# Patient Record
Sex: Female | Born: 1960
Health system: Southern US, Community
[De-identification: ages and names within clinical notes are randomized; demographics above are authoritative.]

## PROBLEM LIST (undated history)

## (undated) DIAGNOSIS — F32A Depression, unspecified: Secondary | ICD-10-CM

## (undated) DIAGNOSIS — E785 Hyperlipidemia, unspecified: Secondary | ICD-10-CM

## (undated) DIAGNOSIS — I252 Old myocardial infarction: Secondary | ICD-10-CM

## (undated) DIAGNOSIS — I2510 Atherosclerotic heart disease of native coronary artery without angina pectoris: Secondary | ICD-10-CM

## (undated) DIAGNOSIS — Z8249 Family history of ischemic heart disease and other diseases of the circulatory system: Secondary | ICD-10-CM

## (undated) DIAGNOSIS — R030 Elevated blood-pressure reading, without diagnosis of hypertension: Secondary | ICD-10-CM

## (undated) DIAGNOSIS — F419 Anxiety disorder, unspecified: Secondary | ICD-10-CM

## (undated) DIAGNOSIS — R931 Abnormal findings on diagnostic imaging of heart and coronary circulation: Principal | ICD-10-CM

## (undated) HISTORY — DX: Abnormal findings on diagnostic imaging of heart and coronary circulation: R93.1

## (undated) HISTORY — DX: Atherosclerotic heart disease of native coronary artery without angina pectoris: I25.2

## (undated) HISTORY — DX: Elevated blood-pressure reading, without diagnosis of hypertension: R03.0

## (undated) HISTORY — DX: Family history of ischemic heart disease and other diseases of the circulatory system: Z82.49

## (undated) HISTORY — DX: Hyperlipidemia, unspecified: E78.5

---

## 1993-02-10 HISTORY — PX: LAPAROSCOPIC CHOLECYSTECTOMY: SUR755

## 1997-06-12 ENCOUNTER — Other Ambulatory Visit: Admission: RE | Admit: 1997-06-12 | Discharge: 1997-06-12 | Payer: Self-pay | Admitting: Obstetrics and Gynecology

## 1998-06-18 ENCOUNTER — Other Ambulatory Visit: Admission: RE | Admit: 1998-06-18 | Discharge: 1998-06-18 | Payer: Self-pay | Admitting: Obstetrics and Gynecology

## 1999-11-08 ENCOUNTER — Other Ambulatory Visit: Admission: RE | Admit: 1999-11-08 | Discharge: 1999-11-08 | Payer: Self-pay | Admitting: Obstetrics and Gynecology

## 2000-12-10 ENCOUNTER — Other Ambulatory Visit: Admission: RE | Admit: 2000-12-10 | Discharge: 2000-12-10 | Payer: Self-pay | Admitting: Obstetrics and Gynecology

## 2002-06-09 ENCOUNTER — Other Ambulatory Visit: Admission: RE | Admit: 2002-06-09 | Discharge: 2002-06-09 | Payer: Self-pay | Admitting: Obstetrics and Gynecology

## 2002-12-12 ENCOUNTER — Emergency Department (HOSPITAL_COMMUNITY): Admission: EM | Admit: 2002-12-12 | Discharge: 2002-12-13 | Payer: Self-pay | Admitting: Emergency Medicine

## 2003-08-31 ENCOUNTER — Other Ambulatory Visit: Admission: RE | Admit: 2003-08-31 | Discharge: 2003-08-31 | Payer: Self-pay | Admitting: Obstetrics and Gynecology

## 2005-02-25 ENCOUNTER — Other Ambulatory Visit: Admission: RE | Admit: 2005-02-25 | Discharge: 2005-02-25 | Payer: Self-pay | Admitting: Obstetrics and Gynecology

## 2012-07-20 ENCOUNTER — Other Ambulatory Visit: Payer: Self-pay | Admitting: *Deleted

## 2012-07-20 MED ORDER — LOSARTAN POTASSIUM 50 MG PO TABS: 50.0000 mg | ORAL_TABLET | Freq: Every day | ORAL | Status: DC

## 2012-07-28 ENCOUNTER — Other Ambulatory Visit: Payer: Self-pay | Admitting: Obstetrics and Gynecology

## 2012-07-28 DIAGNOSIS — N6321 Unspecified lump in the left breast, upper outer quadrant: Secondary | ICD-10-CM

## 2012-08-10 ENCOUNTER — Ambulatory Visit
Admission: RE | Admit: 2012-08-10 | Discharge: 2012-08-10 | Disposition: A | Discharge status: Home / Self Care | Payer: BC Managed Care – PPO | Source: Ambulatory Visit | Attending: Obstetrics and Gynecology | Admitting: Obstetrics and Gynecology

## 2012-08-10 DIAGNOSIS — N6321 Unspecified lump in the left breast, upper outer quadrant: Secondary | ICD-10-CM

## 2013-02-21 ENCOUNTER — Other Ambulatory Visit: Payer: Self-pay | Admitting: *Deleted

## 2013-02-21 MED ORDER — LOSARTAN POTASSIUM 50 MG PO TABS: 50.0000 mg | ORAL_TABLET | Freq: Every day | ORAL | Status: DC

## 2013-09-19 ENCOUNTER — Other Ambulatory Visit: Payer: Self-pay | Admitting: *Deleted

## 2015-07-10 DIAGNOSIS — Z6822 Body mass index (BMI) 22.0-22.9, adult: Secondary | ICD-10-CM | POA: Diagnosis not present

## 2015-07-10 DIAGNOSIS — Z1231 Encounter for screening mammogram for malignant neoplasm of breast: Secondary | ICD-10-CM | POA: Diagnosis not present

## 2015-07-10 DIAGNOSIS — Z01419 Encounter for gynecological examination (general) (routine) without abnormal findings: Secondary | ICD-10-CM | POA: Diagnosis not present

## 2015-10-26 DIAGNOSIS — I10 Essential (primary) hypertension: Secondary | ICD-10-CM | POA: Diagnosis not present

## 2015-10-26 DIAGNOSIS — Z0000 Encounter for general adult medical examination without abnormal findings: Secondary | ICD-10-CM | POA: Diagnosis not present

## 2015-10-26 DIAGNOSIS — E784 Other hyperlipidemia: Secondary | ICD-10-CM | POA: Diagnosis not present

## 2015-11-02 DIAGNOSIS — I10 Essential (primary) hypertension: Secondary | ICD-10-CM | POA: Diagnosis not present

## 2015-11-02 DIAGNOSIS — Z6823 Body mass index (BMI) 23.0-23.9, adult: Secondary | ICD-10-CM | POA: Diagnosis not present

## 2015-11-02 DIAGNOSIS — E784 Other hyperlipidemia: Secondary | ICD-10-CM | POA: Diagnosis not present

## 2015-11-02 DIAGNOSIS — Z0000 Encounter for general adult medical examination without abnormal findings: Secondary | ICD-10-CM | POA: Diagnosis not present

## 2016-04-11 DIAGNOSIS — Z6824 Body mass index (BMI) 24.0-24.9, adult: Secondary | ICD-10-CM | POA: Diagnosis not present

## 2016-04-11 DIAGNOSIS — J111 Influenza due to unidentified influenza virus with other respiratory manifestations: Secondary | ICD-10-CM | POA: Diagnosis not present

## 2016-04-11 DIAGNOSIS — R05 Cough: Secondary | ICD-10-CM | POA: Diagnosis not present

## 2016-04-30 DIAGNOSIS — R430 Anosmia: Secondary | ICD-10-CM | POA: Diagnosis not present

## 2016-04-30 DIAGNOSIS — J302 Other seasonal allergic rhinitis: Secondary | ICD-10-CM | POA: Insufficient documentation

## 2016-05-20 DIAGNOSIS — H2513 Age-related nuclear cataract, bilateral: Secondary | ICD-10-CM | POA: Diagnosis not present

## 2016-07-09 DIAGNOSIS — J3489 Other specified disorders of nose and nasal sinuses: Secondary | ICD-10-CM | POA: Diagnosis not present

## 2016-07-09 DIAGNOSIS — R430 Anosmia: Secondary | ICD-10-CM | POA: Diagnosis not present

## 2016-07-09 DIAGNOSIS — J302 Other seasonal allergic rhinitis: Secondary | ICD-10-CM | POA: Diagnosis not present

## 2016-08-19 DIAGNOSIS — Z1231 Encounter for screening mammogram for malignant neoplasm of breast: Secondary | ICD-10-CM | POA: Diagnosis not present

## 2016-08-19 DIAGNOSIS — Z6825 Body mass index (BMI) 25.0-25.9, adult: Secondary | ICD-10-CM | POA: Diagnosis not present

## 2016-08-19 DIAGNOSIS — Z01419 Encounter for gynecological examination (general) (routine) without abnormal findings: Secondary | ICD-10-CM | POA: Diagnosis not present

## 2016-10-31 DIAGNOSIS — E784 Other hyperlipidemia: Secondary | ICD-10-CM | POA: Diagnosis not present

## 2016-10-31 DIAGNOSIS — I10 Essential (primary) hypertension: Secondary | ICD-10-CM | POA: Diagnosis not present

## 2016-10-31 DIAGNOSIS — E559 Vitamin D deficiency, unspecified: Secondary | ICD-10-CM | POA: Diagnosis not present

## 2016-10-31 DIAGNOSIS — Z0000 Encounter for general adult medical examination without abnormal findings: Secondary | ICD-10-CM | POA: Diagnosis not present

## 2016-11-07 DIAGNOSIS — F325 Major depressive disorder, single episode, in full remission: Secondary | ICD-10-CM | POA: Diagnosis not present

## 2016-11-07 DIAGNOSIS — Z23 Encounter for immunization: Secondary | ICD-10-CM | POA: Diagnosis not present

## 2016-11-07 DIAGNOSIS — Z0000 Encounter for general adult medical examination without abnormal findings: Secondary | ICD-10-CM | POA: Diagnosis not present

## 2016-11-07 DIAGNOSIS — R5383 Other fatigue: Secondary | ICD-10-CM | POA: Diagnosis not present

## 2016-11-07 DIAGNOSIS — I10 Essential (primary) hypertension: Secondary | ICD-10-CM | POA: Diagnosis not present

## 2016-11-07 DIAGNOSIS — E784 Other hyperlipidemia: Secondary | ICD-10-CM | POA: Diagnosis not present

## 2017-02-19 ENCOUNTER — Encounter (INDEPENDENT_AMBULATORY_CARE_PROVIDER_SITE_OTHER): Payer: Self-pay | Admitting: Orthopaedic Surgery

## 2017-02-19 ENCOUNTER — Ambulatory Visit (INDEPENDENT_AMBULATORY_CARE_PROVIDER_SITE_OTHER): Payer: BLUE CROSS/BLUE SHIELD

## 2017-02-19 ENCOUNTER — Ambulatory Visit (INDEPENDENT_AMBULATORY_CARE_PROVIDER_SITE_OTHER): Payer: BLUE CROSS/BLUE SHIELD | Admitting: Orthopaedic Surgery

## 2017-02-19 DIAGNOSIS — M25571 Pain in right ankle and joints of right foot: Secondary | ICD-10-CM | POA: Diagnosis not present

## 2017-02-19 DIAGNOSIS — G5761 Lesion of plantar nerve, right lower limb: Secondary | ICD-10-CM | POA: Diagnosis not present

## 2017-02-19 MED ORDER — METHYLPREDNISOLONE ACETATE 40 MG/ML IJ SUSP
40.0000 mg | INTRAMUSCULAR | Status: AC | PRN
  Administered 2017-02-19: 40 mg

## 2017-02-19 MED ORDER — LIDOCAINE HCL 1 % IJ SOLN
1.0000 mL | INTRAMUSCULAR | Status: AC | PRN
  Administered 2017-02-19: 1 mL

## 2017-02-19 NOTE — Progress Notes (Signed)
Office Visit Note   Patient: Heather Lucas           Date of Birth: 10-06-1960           MRN: [id number] Visit Date: 02/19/2017              Requested by: No referring provider defined for this encounter. PCP: Martha ClanShaw, William, MD   Assessment & Plan: Visit Diagnoses:  1. Pain in right ankle and joints of right foot   2. Morton's neuroma of third interspace of right foot     Plan: I do feel that her pain seems to be more from Morton's neuroma.  She does not have a flatfoot deformity there another the bunion deformity I do feel her foot is wider and that better fitting shoes in the toe box area may help.  Also offered injection between third and fourth interspaces with a steroid and explained the risk and benefits of this.  I gave her information on Morton's neuroma as well.  She tolerated the injection well.  We will see her back in about 2 months to see how she is doing overall.  Should be somewhat I would not hesitate to try one more injection if she gets good relief from this.  Follow-Up Instructions: Return in about 2 months (around 04/19/2017).   Orders:  Orders Placed This Encounter  Procedures  . Foot Inj  . XR Foot Complete Right   No orders of the defined types were placed in this encounter.     Procedures: Foot Inj Date/Time: 02/19/2017 3:01 PM Performed by: Kathryne HitchBlackman, Tanzie Rothschild Y, MD Authorized by: Kathryne HitchBlackman, Nataleigh Griffin Y, MD   Condition: Morton's Neuroma   Location:  R foot Medications:  1 mL lidocaine 1 %; 40 mg methylPREDNISolone acetate 40 MG/ML     Clinical Data: No additional findings.   Subjective: Chief Complaint  Patient presents with  . Right Foot - Pain  Patient comes in today with a history of right foot pain.  It hurts mainly over the third and fourth rays between third and fourth interspace.  She does have bunion deformity and she does wear tighter shoe box types of shoes.  She had a inserts as well.  It hurts mainly with walking and is not  every day.  She denies any numbness tingling in her feet.  She is now diabetic.  She said the bunion deformity that she has of her right foot is not problematic right now.  HPI  Review of Systems She currently denies any headache, chest pain, shortness of breath, fever, chills, nausea, vomiting.  Objective: Vital Signs: There were no vitals taken for this visit.  Physical Exam She is alert and oriented x3 and in no acute distress Ortho Exam Examination of her right foot does show an obvious bunion deformity.  There is a click when I compress the foot with pain between third and fourth interspace suggestive of a Morton's neuroma.  Her foot is otherwise well perfused with normal sensation. Specialty Comments:  No specialty comments available.  Imaging: Xr Foot Complete Right  Result Date: 02/19/2017 3 views of the right foot show bunion deformity of the first ray.  There is otherwise no acute findings.    PMFS History: Patient Active Problem List   Diagnosis Date Noted  . Morton's neuroma of third interspace of right foot 02/19/2017  . Pain in right ankle and joints of right foot 02/19/2017   History reviewed. No pertinent past medical history.  History  reviewed. No pertinent family history.  History reviewed. No pertinent surgical history. Social History   Occupational History  . Not on file  Tobacco Use  . Smoking status: Not on file  Substance and Sexual Activity  . Alcohol use: Not on file  . Drug use: Not on file  . Sexual activity: Not on file

## 2017-03-04 ENCOUNTER — Telehealth (INDEPENDENT_AMBULATORY_CARE_PROVIDER_SITE_OTHER): Payer: Self-pay | Admitting: Orthopaedic Surgery

## 2017-03-04 NOTE — Telephone Encounter (Signed)
I would have her try different shoes that allow her toes to spread out more such as new balance.  We can then see her back in follow-up to see how she is doing overall.

## 2017-03-04 NOTE — Telephone Encounter (Signed)
Please advise 

## 2017-03-04 NOTE — Telephone Encounter (Signed)
Patient came in a few weeks ago and received an injection said it has helped with the pain in the heel of her foot but now the pain has moved down to her toes. Just wanted to know if she should get different shoes/another injection etc. CB # [phone]

## 2017-03-05 NOTE — Telephone Encounter (Signed)
Patient aware of the below message from Dr. Blackman  

## 2017-05-27 DIAGNOSIS — E559 Vitamin D deficiency, unspecified: Secondary | ICD-10-CM | POA: Diagnosis not present

## 2017-05-27 DIAGNOSIS — Z6822 Body mass index (BMI) 22.0-22.9, adult: Secondary | ICD-10-CM | POA: Diagnosis not present

## 2017-05-27 DIAGNOSIS — R55 Syncope and collapse: Secondary | ICD-10-CM | POA: Diagnosis not present

## 2017-05-27 DIAGNOSIS — E7849 Other hyperlipidemia: Secondary | ICD-10-CM | POA: Diagnosis not present

## 2017-05-27 DIAGNOSIS — N390 Urinary tract infection, site not specified: Secondary | ICD-10-CM | POA: Diagnosis not present

## 2017-06-01 DIAGNOSIS — H10413 Chronic giant papillary conjunctivitis, bilateral: Secondary | ICD-10-CM | POA: Diagnosis not present

## 2017-07-10 DIAGNOSIS — F325 Major depressive disorder, single episode, in full remission: Secondary | ICD-10-CM | POA: Diagnosis not present

## 2017-07-10 DIAGNOSIS — Z6823 Body mass index (BMI) 23.0-23.9, adult: Secondary | ICD-10-CM | POA: Diagnosis not present

## 2017-07-10 DIAGNOSIS — R5383 Other fatigue: Secondary | ICD-10-CM | POA: Diagnosis not present

## 2017-08-05 DIAGNOSIS — Z8249 Family history of ischemic heart disease and other diseases of the circulatory system: Secondary | ICD-10-CM | POA: Diagnosis not present

## 2017-08-05 DIAGNOSIS — E7849 Other hyperlipidemia: Secondary | ICD-10-CM | POA: Diagnosis not present

## 2017-08-05 DIAGNOSIS — F339 Major depressive disorder, recurrent, unspecified: Secondary | ICD-10-CM | POA: Diagnosis not present

## 2017-08-05 DIAGNOSIS — I10 Essential (primary) hypertension: Secondary | ICD-10-CM | POA: Diagnosis not present

## 2017-08-07 ENCOUNTER — Other Ambulatory Visit: Payer: Self-pay | Admitting: Internal Medicine

## 2017-08-07 DIAGNOSIS — F339 Major depressive disorder, recurrent, unspecified: Secondary | ICD-10-CM | POA: Diagnosis not present

## 2017-08-07 DIAGNOSIS — Z8249 Family history of ischemic heart disease and other diseases of the circulatory system: Secondary | ICD-10-CM

## 2017-08-07 DIAGNOSIS — E785 Hyperlipidemia, unspecified: Secondary | ICD-10-CM

## 2017-08-12 ENCOUNTER — Ambulatory Visit
Admission: RE | Admit: 2017-08-12 | Discharge: 2017-08-12 | Disposition: A | Discharge status: Home / Self Care | Payer: Self-pay | Source: Ambulatory Visit | Attending: Internal Medicine | Admitting: Internal Medicine

## 2017-08-12 DIAGNOSIS — Z8249 Family history of ischemic heart disease and other diseases of the circulatory system: Secondary | ICD-10-CM

## 2017-08-12 DIAGNOSIS — E785 Hyperlipidemia, unspecified: Secondary | ICD-10-CM

## 2017-08-26 ENCOUNTER — Encounter: Payer: Self-pay | Admitting: Cardiology

## 2017-08-26 ENCOUNTER — Ambulatory Visit: Payer: BLUE CROSS/BLUE SHIELD | Admitting: Cardiology

## 2017-08-26 VITALS — BP 110/70 | HR 75 | Ht 61.0 in | Wt 123.4 lb

## 2017-08-26 DIAGNOSIS — Z8249 Family history of ischemic heart disease and other diseases of the circulatory system: Secondary | ICD-10-CM | POA: Diagnosis not present

## 2017-08-26 DIAGNOSIS — R0609 Other forms of dyspnea: Secondary | ICD-10-CM | POA: Insufficient documentation

## 2017-08-26 DIAGNOSIS — R931 Abnormal findings on diagnostic imaging of heart and coronary circulation: Secondary | ICD-10-CM

## 2017-08-26 DIAGNOSIS — E785 Hyperlipidemia, unspecified: Secondary | ICD-10-CM | POA: Insufficient documentation

## 2017-08-26 DIAGNOSIS — E7849 Other hyperlipidemia: Secondary | ICD-10-CM | POA: Diagnosis not present

## 2017-08-26 DIAGNOSIS — R0600 Dyspnea, unspecified: Secondary | ICD-10-CM

## 2017-08-26 DIAGNOSIS — T733XXA Exhaustion due to excessive exertion, initial encounter: Secondary | ICD-10-CM | POA: Diagnosis not present

## 2017-08-26 HISTORY — DX: Other hyperlipidemia: E78.49

## 2017-08-26 HISTORY — DX: Family history of ischemic heart disease and other diseases of the circulatory system: Z82.49

## 2017-08-26 HISTORY — DX: Abnormal findings on diagnostic imaging of heart and coronary circulation: R93.1

## 2017-08-26 MED ORDER — METOPROLOL TARTRATE 50 MG PO TABS: 50.0000 mg | ORAL_TABLET | Freq: Once | ORAL | 0 refills | Status: DC

## 2017-08-26 NOTE — Progress Notes (Signed)
PCP: Martha ClanShaw, William, MD  Clinic Note: Chief Complaint  Patient presents with  . New Patient (Initial Visit)    fatigue & DOE; Family h/o premature CAD    HPI: Heather Lucas is a 57 y.o. female who is being seen today for the evaluation of CORONARY ARTERY CALCIFICATION - Agatston Score 125 at the request of Martha ClanShaw, William, MD. Heather Lucas is the daughter of a former patient of mine who recently died (last year - Mother with MI @ age 58 and brother who just had a heart attack within the last few years.).  Heather CavaShiela Ribera was recently seen by Dr. Clelia CroftShaw with complaints of chronic fatigue & dyspnea.  She was concerned about her family history & was evaluated with a Coronary Calcium Score (reviewed below).  Based upon the results & her symptoms, she is referred for cardiology evalution.  Recent Hospitalizations: none  Studies Personally Reviewed - (if available, images/films reviewed: From Epic Chart or Care Everywhere)  CT CARDIAC CALCIUM SCORE: total Agatston Score = 125 with calcification noted on the left main, LAD and right coronary arteries.  Interval History: Heather Lucas presents here today really without any complaints of chest pain, mostly notes having exertional shortness of breath and fatigue.  She really describes it as a almost like a motivation that seems to have improved somewhat with treating for depression, but she just has no sense of "get up and go" she spends lots of time just laying around my couch.  But she does note however she will try to exert herself versus going for a walker going up and down flights of steps she probably feels more fatigued but also notes having dyspnea associated with it.  No associated chest tightness or pressure with exception of a couple times when she has gone little more than usual.  Otherwise, she denies any resting or exertional chest pain with normal level activity.    No PND, orthopnea or edema.  She occasionally has some "flip-flopping" sensations  consistent with palpitations, but no real symptoms of lightheadedness, dizziness, weakness or syncope/near syncope. No TIA/amaurosis fugax symptoms. No claudication.  ROS: A comprehensive was performed. Review of Systems  Constitutional: Positive for malaise/fatigue (Lack of get up and go). Negative for chills and fever.  HENT: Negative for congestion and nosebleeds.   Eyes: Negative.   Respiratory: Negative for cough and wheezing.        DOE per HPI  Gastrointestinal: Negative for abdominal pain, blood in stool, constipation, heartburn and melena.  Genitourinary: Negative for hematuria.  Musculoskeletal: Negative for joint pain and myalgias.  Skin: Negative.   Neurological: Positive for headaches. Negative for dizziness, focal weakness, seizures and weakness.  Psychiatric/Behavioral: Positive for depression. Negative for memory loss. The patient has insomnia. The patient is not nervous/anxious.        Somewhat improved with Lexapro.  All other systems reviewed and are negative.   I have reviewed and (if needed) personally updated the patient's problem list, medications, allergies, past medical and surgical history, social and family history.   Past Medical History:  Diagnosis Date  . Agatston coronary artery calcium score between 100 and 199 08/26/2017  . Borderline hyperlipidemia    as of 10/2016: TC 134, TG 90, HDL 51, LDL 95 (PCP recently increase rosuvastatin dose from 10 to 20 mg)  . Borderline hypertension   . Family history of premature coronary artery disease 08/26/2017    Past Surgical History:  Procedure Laterality Date  . LAPAROSCOPIC CHOLECYSTECTOMY  1995  Current Meds  Medication Sig  . DULoxetine (CYMBALTA) 60 MG capsule Take 60 mg by mouth daily.  Marland Kitchen loratadine (CLARITIN) 10 MG tablet Take 10 mg by mouth daily.  . rosuvastatin (CRESTOR) 20 MG tablet Take 20 mg by mouth daily.  . Senna-Psyllium (PERDIEM PO) Take by mouth.  . Vitamin D, Ergocalciferol, (DRISDOL)  50000 units CAPS capsule Take 50,000 Units by mouth daily.    No Known Allergies  Social History   Tobacco Use  . Smoking status: Never Smoker  . Smokeless tobacco: Never Used  Substance Use Topics  . Alcohol use: Not Currently  . Drug use: Never   Social History   Social History Narrative   She is single.  Lives alone.    Does not routinely exercise    family history includes CAD in her brother; Cancer in her father; Colon cancer in her paternal grandmother; Diabetes in her maternal grandmother; Heart attack in her brother and maternal grandmother; Heart attack (age of onset: 43) in her mother; Other in her paternal grandfather.  Wt Readings from Last 3 Encounters:  08/26/17 123 lb 6.4 oz (56 kg)    PHYSICAL EXAM BP 110/70   Pulse 75   Ht 5\' 1"  (1.549 m)   Wt 123 lb 6.4 oz (56 kg)   LMP  (Approximate)   BMI 23.32 kg/m  Physical Exam  Constitutional: She is oriented to person, place, and time. She appears well-developed and well-nourished. No distress.  Healthy-appearing.  Well-groomed  HENT:  Head: Normocephalic and atraumatic.  Eyes: Pupils are equal, round, and reactive to light. Conjunctivae and EOM are normal.  Neck: Normal range of motion. Neck supple. No hepatojugular reflux and no JVD present. Carotid bruit is not present.  Cardiovascular: Normal rate, regular rhythm, normal heart sounds and intact distal pulses.  No extrasystoles are present. PMI is not displaced. Exam reveals no gallop, no S4 and no friction rub.  No murmur heard. Pulmonary/Chest: Effort normal and breath sounds normal. No respiratory distress. She has no wheezes.  Abdominal: Soft. Bowel sounds are normal. She exhibits no distension. There is no tenderness. There is no rebound.  Musculoskeletal: Normal range of motion. She exhibits no edema.  Neurological: She is alert and oriented to person, place, and time. No cranial nerve deficit.  Psychiatric: She has a normal mood and affect. Her  behavior is normal. Judgment and thought content normal.  Somewhat subdued mood    Adult ECG Report  Rate: 75 ;  Rhythm: normal sinus rhythm and Normal axis, intervals and durations;   Narrative Interpretation: Normal EKG  Other studies Reviewed: Additional studies/ records that were reviewed today include:  Recent Labs: September 21, 20 TC 164, TG 90, HDL of 51, LDL 95.  Hemoglobin18  Hemoglobin 14.1.  Creatinine 0.8.  TSH 1.03   ASSESSMENT / PLAN: Problem List Items Addressed This Visit    Hyperlipidemia due to dietary fat intake (Chronic)    Lipids actually pretty good last September and she is recently increased her dose based on the coronary calcium score.  This is appropriate as we should now be shooting for a goal LDL less than 70 with evidence of coronary artery disease and no significant family history.      Relevant Medications   rosuvastatin (CRESTOR) 20 MG tablet   Other Relevant Orders   EKG 12-Lead (Completed)   Basic metabolic panel   CT CORONARY MORPH W/CTA COR W/SCORE W/CA W/CM &/OR WO/CM   CT CORONARY FRACTIONAL FLOW RESERVE  DATA PREP   CT CORONARY FRACTIONAL FLOW RESERVE FLUID ANALYSIS   Fatigue due to excessive exertion    Similar to DOE      Relevant Orders   EKG 12-Lead (Completed)   Basic metabolic panel   CT CORONARY MORPH W/CTA COR W/SCORE W/CA W/CM &/OR WO/CM   CT CORONARY FRACTIONAL FLOW RESERVE DATA PREP   CT CORONARY FRACTIONAL FLOW RESERVE FLUID ANALYSIS   Family history of premature coronary artery disease (Chronic)    Significant family history with her mother and brother having early symptomatic CAD.   Screening cardiac score was moderately elevated therefore we are evaluated with a cardiac CT angiogram to delineate her anatomy. Continue being more aggressive with risk factor modifications recently had her --- statin increased and is probably due for follow-up labs soon.  This is being monitored by PCP. Blood pressure is well controlled no  medications. Recommend a baby aspirin with moderate CAD at least per coronary calcium score..      Relevant Orders   EKG 12-Lead (Completed)   Basic metabolic panel   CT CORONARY MORPH W/CTA COR W/SCORE W/CA W/CM &/OR WO/CM   CT CORONARY FRACTIONAL FLOW RESERVE DATA PREP   CT CORONARY FRACTIONAL FLOW RESERVE FLUID ANALYSIS   DOE (dyspnea on exertion)    I think her exertional dyspnea is probably related to deconditioning the fatigue is from deconditioning plus depression however, in an effort to try to motivate her to be more active she needs reassurance that her coronary artery disease is not limiting factor. Plan: Coronary CT angiogram with possible FFR.      Relevant Orders   EKG 12-Lead (Completed)   Basic metabolic panel   CT CORONARY MORPH W/CTA COR W/SCORE W/CA W/CM &/OR WO/CM   CT CORONARY FRACTIONAL FLOW RESERVE DATA PREP   CT CORONARY FRACTIONAL FLOW RESERVE FLUID ANALYSIS   Agatston coronary artery calcium score between 100 and 199 - Primary (Chronic)    Somewhat moderate elevation of coronary calcium score but involving the LAD and left main.  The only major symptoms he is noticing his exertional fatigue and dyspnea.  She does have a significant family history and I would like to better evaluate the extent of CAD.    Given the concern for possible left main, I would not want to do a stress test which could potentially be false negative.involvement  Plan: Coronary CT angiogram with preprocedural chemistry panel. -- this allows [place] to get both an anatomic evaluation as well as a potential physiologic evaluation with FFR. I will see her back after study.      Relevant Orders   EKG 12-Lead (Completed)   Basic metabolic panel   CT CORONARY MORPH W/CTA COR W/SCORE W/CA W/CM &/OR WO/CM   CT CORONARY FRACTIONAL FLOW RESERVE DATA PREP   CT CORONARY FRACTIONAL FLOW RESERVE FLUID ANALYSIS      Current medicines are reviewed at length with the patient today.  (+/- concerns)  none The following changes have been made:  recommend baby ASA ( 81 mg)  Patient Instructions  Medication Instructions: Dr Herbie Baltimore recommends that you continue on your current medications as directed. Please refer to the Current Medication list given to you today.  Labwork: Your physician recommends that you return for lab work prior to your coronary CT.  Follow-up: Dr Herbie Baltimore recommends that you schedule a follow-up appointment in AUGUST 2019.  Studies Ordered:   Orders Placed This Encounter  Procedures  . CT CORONARY MORPH W/CTA COR W/SCORE  W/CA W/CM &/OR WO/CM  . CT CORONARY FRACTIONAL FLOW RESERVE DATA PREP  . CT CORONARY FRACTIONAL FLOW RESERVE FLUID ANALYSIS  . Basic metabolic panel  . EKG 12-Lead      Bryan Lemma, M.D., M.S. Interventional Cardiologist   Pager # [phone] Phone # [phone] [address] [place], [place] [address]   Thank you for choosing [facility] at [facility]!!

## 2017-08-26 NOTE — Patient Instructions (Signed)
Medication Instructions: Dr Herbie BaltimoreHarding recommends that you continue on your current medications as directed. Please refer to the Current Medication list given to you today.  Labwork: Your physician recommends that you return for lab work prior to your coronary CT.  Testing/Procedures: 1. Coronary CT - Your physician has requested that you have cardiac CT. Cardiac computed tomography (CT) is a painless test that uses an x-ray machine to take clear, detailed pictures of your heart. For further information please visit [email or web address]. Please follow instruction sheet as given.  Follow-up: Dr Herbie BaltimoreHarding recommends that you schedule a follow-up appointment in AUGUST 2019.  If you need a refill on your cardiac medications before your next appointment, please call your pharmacy.    Please arrive at the [facility] main entrance [facility] at _______ AM (30-45 minutes prior to test start time)  [facility] [address] [place], [place] [address] [phone]  Proceed to the [facility] Radiology Department (First Floor).  Please follow these instructions carefully (unless otherwise directed):  On the Night Before the Test: . Drink plenty of water. . Do not consume any caffeinated/decaffeinated beverages or chocolate 12 hours prior to your test. . Do not take any antihistamines 12 hours prior to your test.  On the Day of the Test: . Drink plenty of water. Do not drink any water within one hour of the test. . Do not eat any food 4 hours prior to the test. . You may take your regular medications prior to the test. . IF NOT ON A BETA BLOCKER - Take 50 mg of lopressor (metoprolol) one hour before the test.  After the Test: . Drink plenty of water. . After receiving IV contrast, you may experience a mild flushed feeling. This is normal. . On occasion, you may experience a mild rash up to 24 hours after the test. This is not dangerous. If this occurs, you can take  Benadryl 25 mg and increase your fluid intake. . If you experience trouble breathing, this can be serious. If it is severe call 911 IMMEDIATELY. If it is mild, please call our office. . If you take any of these medications: Glipizide/Metformin, Avandament, Glucavance, please do not take 48 hours after completing test.

## 2017-08-28 ENCOUNTER — Encounter: Payer: Self-pay | Admitting: Cardiology

## 2017-08-28 DIAGNOSIS — F339 Major depressive disorder, recurrent, unspecified: Secondary | ICD-10-CM | POA: Diagnosis not present

## 2017-08-28 NOTE — Assessment & Plan Note (Signed)
I think her exertional dyspnea is probably related to deconditioning the fatigue is from deconditioning plus depression however, in an effort to try to motivate her to be more active she needs reassurance that her coronary artery disease is not limiting factor. Plan: Coronary CT angiogram with possible FFR.

## 2017-08-28 NOTE — Assessment & Plan Note (Signed)
Lipids actually pretty good last September and she is recently increased her dose based on the coronary calcium score.  This is appropriate as we should now be shooting for a goal LDL less than 70 with evidence of coronary artery disease and no significant family history.

## 2017-08-28 NOTE — Assessment & Plan Note (Signed)
Somewhat moderate elevation of coronary calcium score but involving the LAD and left main.  The only major symptoms he is noticing his exertional fatigue and dyspnea.  She does have a significant family history and I would like to better evaluate the extent of CAD.    Given the concern for possible left main, I would not want to do a stress test which could potentially be false negative.involvement  Plan: Coronary CT angiogram with preprocedural chemistry panel. -- this allows us to get both an anatomic evaluation as well as a potential physiologic evaluation with FFR. I will see her back after study.

## 2017-08-28 NOTE — Assessment & Plan Note (Signed)
Similar to DOE

## 2017-08-28 NOTE — Assessment & Plan Note (Signed)
Significant family history with her mother and brother having early symptomatic CAD.   Screening cardiac score was moderately elevated therefore we are evaluated with a cardiac CT angiogram to delineate her anatomy. Continue being more aggressive with risk factor modifications recently had her --- statin increased and is probably due for follow-up labs soon.  This is being monitored by PCP. Blood pressure is well controlled no medications. Recommend a baby aspirin with moderate CAD at least per coronary calcium score..Marland Kitchen

## 2017-09-28 DIAGNOSIS — N951 Menopausal and female climacteric states: Secondary | ICD-10-CM | POA: Diagnosis not present

## 2017-10-14 ENCOUNTER — Ambulatory Visit: Payer: BLUE CROSS/BLUE SHIELD | Admitting: Cardiology

## 2017-10-16 DIAGNOSIS — R931 Abnormal findings on diagnostic imaging of heart and coronary circulation: Secondary | ICD-10-CM | POA: Diagnosis not present

## 2017-10-16 DIAGNOSIS — T733XXA Exhaustion due to excessive exertion, initial encounter: Secondary | ICD-10-CM | POA: Diagnosis not present

## 2017-10-16 DIAGNOSIS — R0609 Other forms of dyspnea: Secondary | ICD-10-CM | POA: Diagnosis not present

## 2017-10-16 DIAGNOSIS — Z8249 Family history of ischemic heart disease and other diseases of the circulatory system: Secondary | ICD-10-CM | POA: Diagnosis not present

## 2017-10-17 LAB — BASIC METABOLIC PANEL
BUN/Creatinine Ratio: 18 (ref 9–23)
BUN: 14 mg/dL (ref 6–24)
CO2: 25 mmol/L (ref 20–29)
Calcium: 9 mg/dL (ref 8.7–10.2)
Chloride: 102 mmol/L (ref 96–106)
Creatinine, Ser: 0.79 mg/dL (ref 0.57–1.00)
GFR calc Af Amer: 95 mL/min/{1.73_m2} (ref >59)
GFR calc non Af Amer: 83 mL/min/{1.73_m2} (ref >59)
Glucose: 86 mg/dL (ref 65–99)
Potassium: 4.6 mmol/L (ref 3.5–5.2)
Sodium: 141 mmol/L (ref 134–144)

## 2017-10-22 ENCOUNTER — Ambulatory Visit (HOSPITAL_COMMUNITY)
Admission: RE | Admit: 2017-10-22 | Discharge: 2017-10-22 | Disposition: A | Discharge status: Home / Self Care | Payer: BLUE CROSS/BLUE SHIELD | Source: Ambulatory Visit | Attending: Cardiology | Admitting: Cardiology

## 2017-10-22 ENCOUNTER — Encounter (HOSPITAL_COMMUNITY): Payer: Self-pay

## 2017-10-22 ENCOUNTER — Ambulatory Visit (HOSPITAL_COMMUNITY): Admission: RE | Admit: 2017-10-22 | Payer: BLUE CROSS/BLUE SHIELD | Source: Ambulatory Visit

## 2017-10-22 DIAGNOSIS — T733XXA Exhaustion due to excessive exertion, initial encounter: Secondary | ICD-10-CM | POA: Diagnosis not present

## 2017-10-22 DIAGNOSIS — R0609 Other forms of dyspnea: Secondary | ICD-10-CM | POA: Diagnosis not present

## 2017-10-22 DIAGNOSIS — E7849 Other hyperlipidemia: Secondary | ICD-10-CM | POA: Diagnosis not present

## 2017-10-22 DIAGNOSIS — X58XXXA Exposure to other specified factors, initial encounter: Secondary | ICD-10-CM | POA: Insufficient documentation

## 2017-10-22 DIAGNOSIS — I2510 Atherosclerotic heart disease of native coronary artery without angina pectoris: Secondary | ICD-10-CM | POA: Insufficient documentation

## 2017-10-22 DIAGNOSIS — Z8249 Family history of ischemic heart disease and other diseases of the circulatory system: Secondary | ICD-10-CM

## 2017-10-22 DIAGNOSIS — R931 Abnormal findings on diagnostic imaging of heart and coronary circulation: Secondary | ICD-10-CM | POA: Insufficient documentation

## 2017-10-22 DIAGNOSIS — R0600 Dyspnea, unspecified: Secondary | ICD-10-CM

## 2017-10-22 MED ORDER — NITROGLYCERIN 0.4 MG SL SUBL
0.8000 mg | SUBLINGUAL_TABLET | Freq: Once | SUBLINGUAL | Status: AC
  Administered 2017-10-22: 0.8 mg via SUBLINGUAL
  Filled 2017-10-22: qty 25

## 2017-10-22 MED ORDER — IOPAMIDOL (ISOVUE-370) INJECTION 76%
100.0000 mL | Freq: Once | INTRAVENOUS | Status: AC | PRN
  Administered 2017-10-22: 80 mL via INTRAVENOUS

## 2017-10-22 MED ORDER — NITROGLYCERIN 0.4 MG SL SUBL
SUBLINGUAL_TABLET | SUBLINGUAL | Status: AC
  Administered 2017-10-22: 0.8 mg via SUBLINGUAL
  Filled 2017-10-22: qty 2

## 2017-10-26 ENCOUNTER — Telehealth: Payer: Self-pay | Admitting: Cardiology

## 2017-10-26 NOTE — Telephone Encounter (Signed)
Just got report this PM - results sent to RN to call for review.  Intermediate Coronary Calcium Score, but No obstructive Coronary Artery disease (no blockage > 25%).    Recommendation is aggressive Risk Factor Modification -- controlling BP, Cholesterol & Diabetes if present.  No Smoking (stop if possible -- I don't think she does smoke).  Bryan Lemmaavid Wendle Kina, MD

## 2017-10-26 NOTE — Telephone Encounter (Signed)
Follow Up:    Pt calling to find out if her CT results are ready from 10-22-17?

## 2017-10-26 NOTE — Telephone Encounter (Signed)
Spoke with pt who states she was inquiring about result from her Coronary CTA. Advised that Dr. Herbie BaltimoreHarding would have to review and interpret result and she would receive a call soon from his nurse. Pt verbalized understanding.

## 2017-10-27 NOTE — Telephone Encounter (Signed)
Pt updated with test results along with Dr. Elissa HeftyHarding's recommendation. Pt verbalized understanding.

## 2017-10-27 NOTE — Telephone Encounter (Signed)
Left message to call back  

## 2017-11-13 DIAGNOSIS — I10 Essential (primary) hypertension: Secondary | ICD-10-CM | POA: Diagnosis not present

## 2017-11-13 DIAGNOSIS — R82998 Other abnormal findings in urine: Secondary | ICD-10-CM | POA: Diagnosis not present

## 2017-11-13 DIAGNOSIS — Z0000 Encounter for general adult medical examination without abnormal findings: Secondary | ICD-10-CM | POA: Diagnosis not present

## 2017-11-13 DIAGNOSIS — E559 Vitamin D deficiency, unspecified: Secondary | ICD-10-CM | POA: Diagnosis not present

## 2017-11-18 ENCOUNTER — Ambulatory Visit: Payer: BLUE CROSS/BLUE SHIELD | Admitting: Cardiology

## 2017-11-20 DIAGNOSIS — E7849 Other hyperlipidemia: Secondary | ICD-10-CM | POA: Diagnosis not present

## 2017-11-20 DIAGNOSIS — I2510 Atherosclerotic heart disease of native coronary artery without angina pectoris: Secondary | ICD-10-CM | POA: Diagnosis not present

## 2017-11-20 DIAGNOSIS — Z23 Encounter for immunization: Secondary | ICD-10-CM | POA: Diagnosis not present

## 2017-11-20 DIAGNOSIS — F3342 Major depressive disorder, recurrent, in full remission: Secondary | ICD-10-CM | POA: Diagnosis not present

## 2017-11-20 DIAGNOSIS — Z0000 Encounter for general adult medical examination without abnormal findings: Secondary | ICD-10-CM | POA: Diagnosis not present

## 2017-11-20 DIAGNOSIS — I10 Essential (primary) hypertension: Secondary | ICD-10-CM | POA: Diagnosis not present

## 2017-12-01 DIAGNOSIS — Z1231 Encounter for screening mammogram for malignant neoplasm of breast: Secondary | ICD-10-CM | POA: Diagnosis not present

## 2017-12-01 DIAGNOSIS — Z01419 Encounter for gynecological examination (general) (routine) without abnormal findings: Secondary | ICD-10-CM | POA: Diagnosis not present

## 2017-12-01 DIAGNOSIS — Z6823 Body mass index (BMI) 23.0-23.9, adult: Secondary | ICD-10-CM | POA: Diagnosis not present

## 2018-02-10 HISTORY — PX: NM MYOVIEW LTD: HXRAD82

## 2018-04-22 DIAGNOSIS — F339 Major depressive disorder, recurrent, unspecified: Secondary | ICD-10-CM | POA: Diagnosis not present

## 2018-04-22 DIAGNOSIS — Z6825 Body mass index (BMI) 25.0-25.9, adult: Secondary | ICD-10-CM | POA: Diagnosis not present

## 2018-04-30 DIAGNOSIS — F339 Major depressive disorder, recurrent, unspecified: Secondary | ICD-10-CM | POA: Diagnosis not present

## 2018-04-30 DIAGNOSIS — F419 Anxiety disorder, unspecified: Secondary | ICD-10-CM | POA: Diagnosis not present

## 2018-06-30 DIAGNOSIS — R300 Dysuria: Secondary | ICD-10-CM | POA: Diagnosis not present

## 2018-09-07 DIAGNOSIS — R3911 Hesitancy of micturition: Secondary | ICD-10-CM | POA: Diagnosis not present

## 2018-09-07 DIAGNOSIS — R3912 Poor urinary stream: Secondary | ICD-10-CM | POA: Diagnosis not present

## 2018-09-22 ENCOUNTER — Ambulatory Visit (INDEPENDENT_AMBULATORY_CARE_PROVIDER_SITE_OTHER): Payer: BC Managed Care – PPO | Admitting: Orthopaedic Surgery

## 2018-09-22 ENCOUNTER — Other Ambulatory Visit: Payer: Self-pay

## 2018-09-22 DIAGNOSIS — G5761 Lesion of plantar nerve, right lower limb: Secondary | ICD-10-CM

## 2018-09-22 MED ORDER — LIDOCAINE HCL 1 % IJ SOLN
1.0000 mL | INTRAMUSCULAR | Status: AC | PRN
  Administered 2018-09-22: 1 mL

## 2018-09-22 MED ORDER — METHYLPREDNISOLONE ACETATE 40 MG/ML IJ SUSP
40.0000 mg | INTRAMUSCULAR | Status: AC | PRN
  Administered 2018-09-22: 40 mg

## 2018-09-22 NOTE — Progress Notes (Signed)
Office Visit Note   Patient: Heather Lucas           Date of Birth: 02-03-1961           MRN: [id number] Visit Date: 09/22/2018              Requested by: Marton Redwood, MD [address] [place],   [address] PCP: Marton Redwood, MD   Assessment & Plan: Visit Diagnoses:  1. Morton's neuroma of third interspace of right foot     Plan: I did provide a steroid injection in her webspace between the third and fourth toes which she tolerated well.  All question concerns were answered and addressed.  Follow be as needed.  Follow-Up Instructions: Return if symptoms worsen or fail to improve.   Orders:  No orders of the defined types were placed in this encounter.  No orders of the defined types were placed in this encounter.     Procedures: Foot Inj  Date/Time: 09/22/2018 3:14 PM Performed by: Mcarthur Rossetti, MD Authorized by: Mcarthur Rossetti, MD   Indications:  Neuroma Condition: Morton's Neuroma   Location:  R foot Medications:  1 mL lidocaine 1 %; 40 mg methylPREDNISolone acetate 40 MG/ML     Clinical Data: No additional findings.   Subjective: Chief Complaint  Patient presents with  . Right Foot - Pain  The patient comes in today with a painful foot.  This is her right foot.  Is between the third and fourth digits.  This is at the webspace.  She has had a history of a Morton's neuroma before in this area that we provided injection and remotely which helped for a very long period time.  She is worked on better shoe wear with wider toe box.  She is recently had a flareup of pain and would like to have an injection today.  She is moving to [place] very soon.  She denies any other acute changes in her medical status.  She is not a diabetic.  HPI  Review of Systems She currently denies any headache, chest pain, shortness of breath, fever, chills, nausea, vomiting  Objective: Vital Signs: There were no vitals taken for this visit.  Physical  Exam She is alert and orient x3 and in no acute distress Ortho Exam Examination of her right foot shows normal contours of all the webspaces.  She has a lot of pain between the third and fourth interspace between the third and fourth metatarsals and pain on compression of her foot and palpation of this area.  Her foot is otherwise well perfused and is neurovascularly intact. Specialty Comments:  No specialty comments available.  Imaging: No results found.   PMFS History: Patient Active Problem List   Diagnosis Date Noted  . Agatston coronary artery calcium score between 100 and 199 08/26/2017  . Family history of premature coronary artery disease 08/26/2017  . DOE (dyspnea on exertion) 08/26/2017  . Fatigue due to excessive exertion 08/26/2017  . Hyperlipidemia due to dietary fat intake 08/26/2017  . Morton's neuroma of third interspace of right foot 02/19/2017  . Pain in right ankle and joints of right foot 02/19/2017   Past Medical History:  Diagnosis Date  . Agatston coronary artery calcium score between 100 and 199 08/26/2017  . Borderline hyperlipidemia    as of 10/2016: TC 134, TG 90, HDL 51, LDL 95 (PCP recently increase rosuvastatin dose from 10 to 20 mg)  . Borderline hypertension   . Family history  of premature coronary artery disease 08/26/2017    Family History  Problem Relation Age of Onset  . Heart attack Mother [id number]       Recently died; age 59.  . Cancer Father   . Heart attack Brother   . CAD Brother   . Heart attack Maternal Grandmother   . Diabetes Maternal Grandmother   . Colon cancer Paternal Grandmother   . Other Paternal Grandfather        Circulatory problems    Past Surgical History:  Procedure Laterality Date  . LAPAROSCOPIC CHOLECYSTECTOMY  1995   Social History   Occupational History    Employer: [employer]    Comment: Works doing Education officer, communityunderwriting  Tobacco Use  . Smoking status: Never Smoker  . Smokeless tobacco: Never Used  Substance and Sexual  Activity  . Alcohol use: Not Currently  . Drug use: Never  . Sexual activity: Not Currently

## 2019-04-19 DIAGNOSIS — E7801 Familial hypercholesterolemia: Secondary | ICD-10-CM | POA: Insufficient documentation

## 2019-12-08 IMAGING — CT CT HEART SCORING
3 series · 14 of 20 positions shown, 16 images · non-contrast
Comparison: None.

CLINICAL DATA: Hyperlipidemia

EXAM:
CT HEART FOR CALCIUM SCORING
TECHNIQUE: CT heart was performed on a 64 channel system using prospective ECG
gating.
A non-contrast exam for calcium scoring was performed.
Note that this exam targets the heart and the chest was not imaged
in its entirety.

[Series 2: calcium scoring 2.00 qr36 bestdiast 70% · axial · 0.31mm/px · z∈[+1376,+1460]mm · 4 of 70 slices shown]
[im 14/70  vessel]
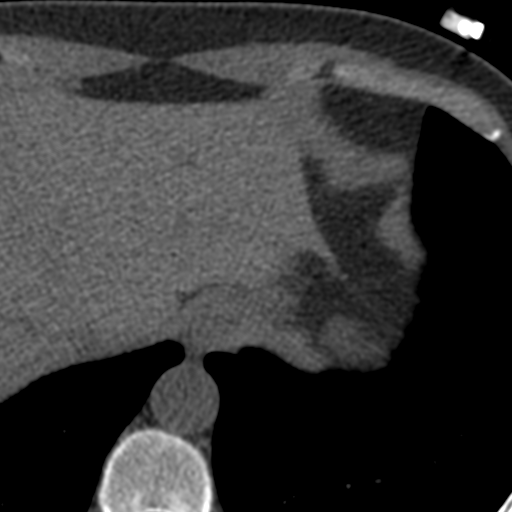
[im 28/70  vessel]
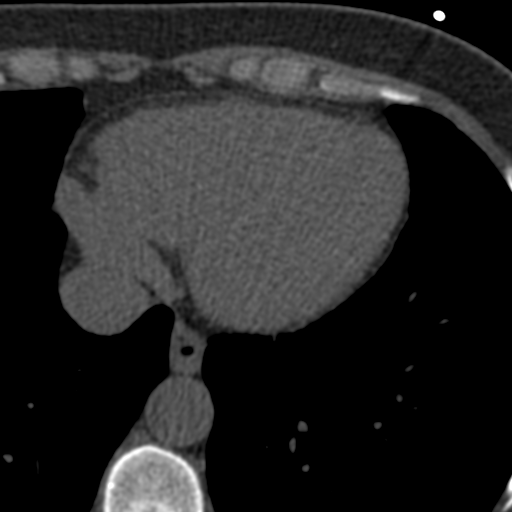
[im 42/70  vessel]
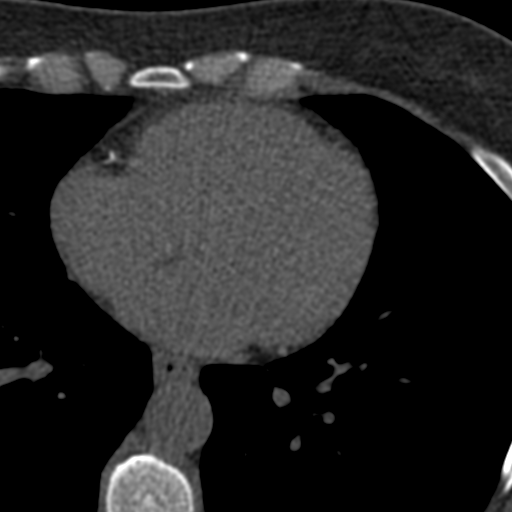
[im 56/70  vessel]
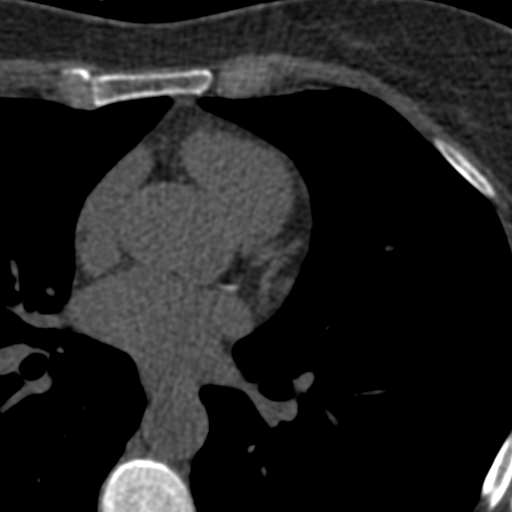

[Series 3: calcium scoring 2.00 br40 bestdiast 70% ax fov · axial · 0.44mm/px · z∈[+1372,+1464]mm · 5 of 70 slices shown, 7 images]
[im 12/70  vessel]
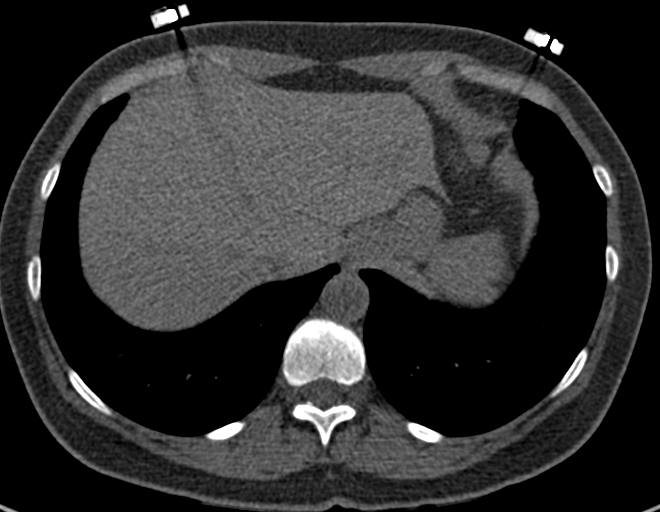
[im 12/70  lung]
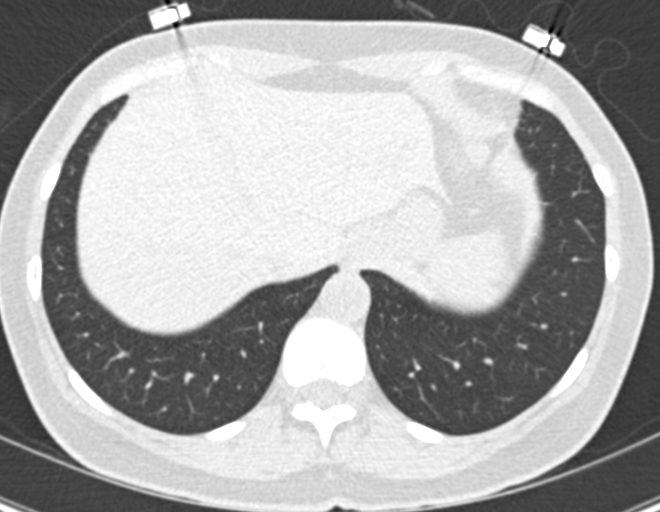
[im 24/70  vessel]
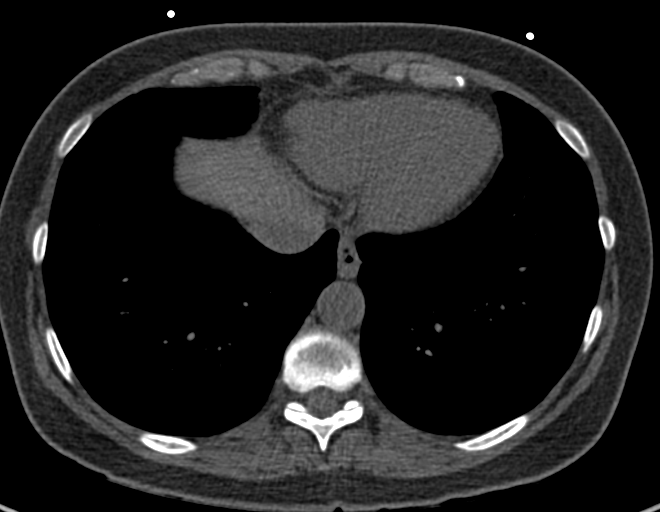
[im 35/70  vessel]
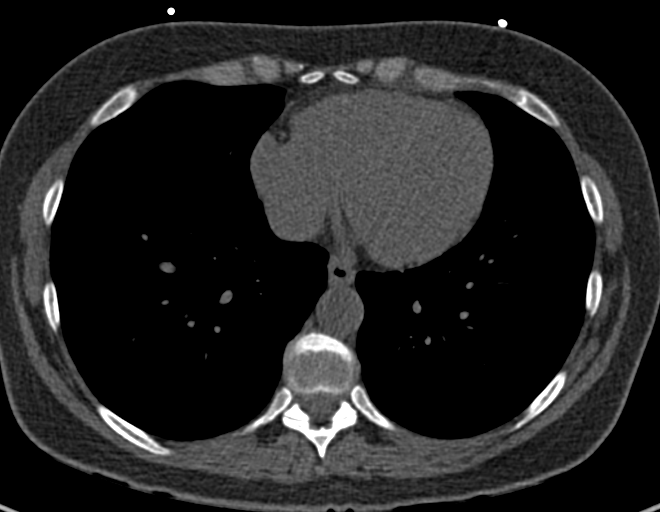
[im 47/70  vessel]
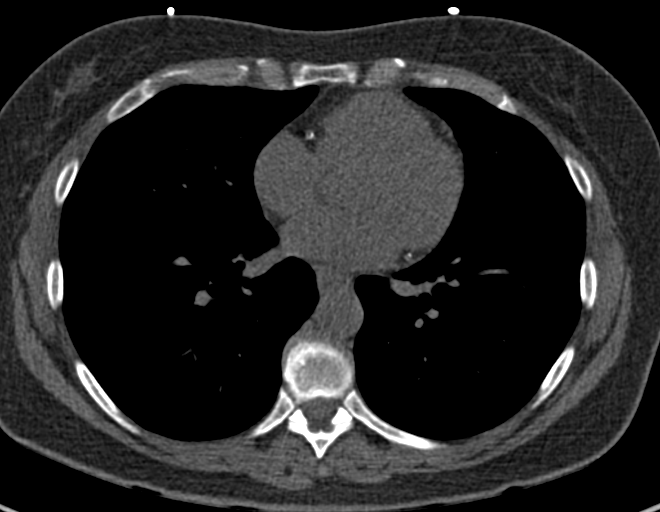
[im 58/70  vessel]
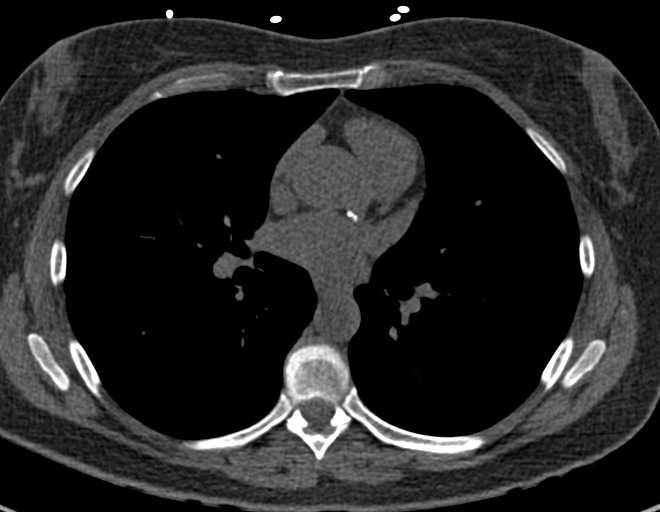
[im 58/70  lung]
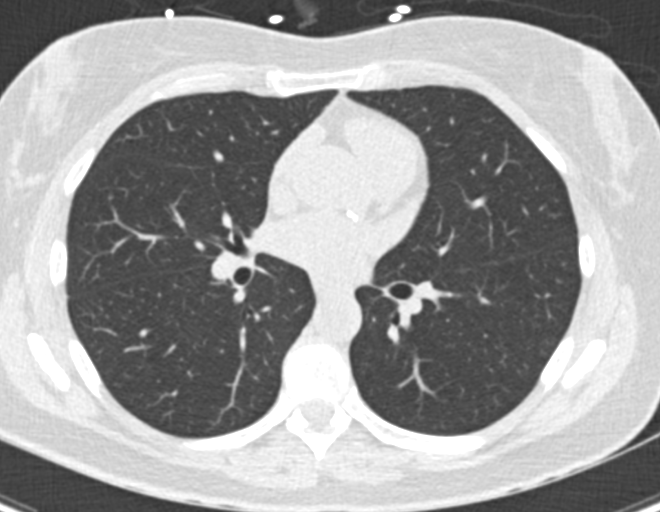

[Series 9: calcium scoring 2.00 br60 bestdiast 70% ax fov · axial · 0.44mm/px · z∈[+1372,+1464]mm · 5 of 70 slices shown]
[im 12/70  vessel]
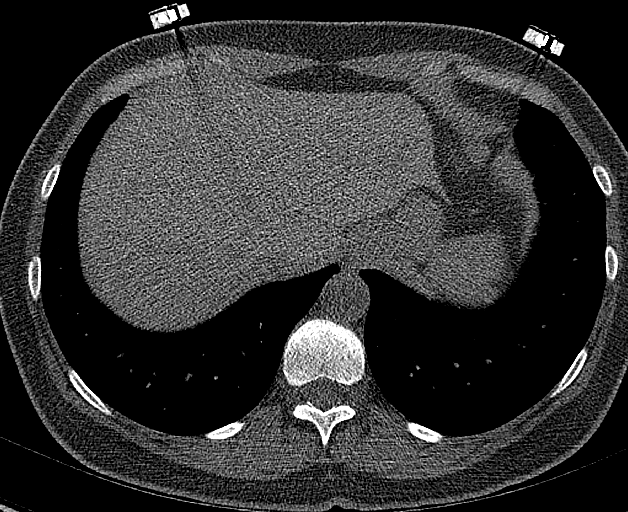
[im 24/70  vessel]
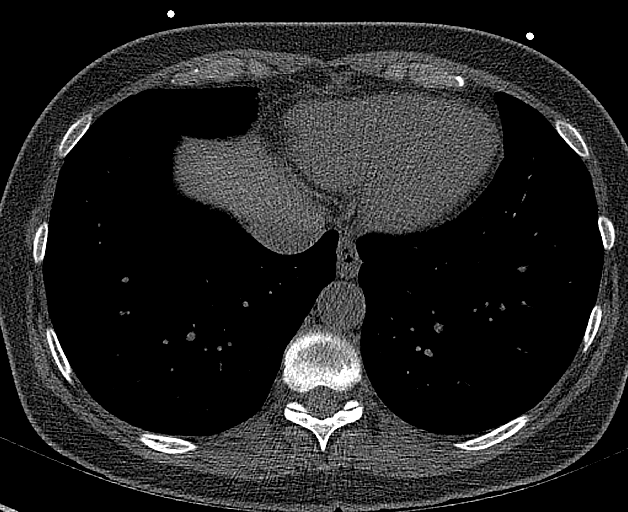
[im 35/70  vessel]
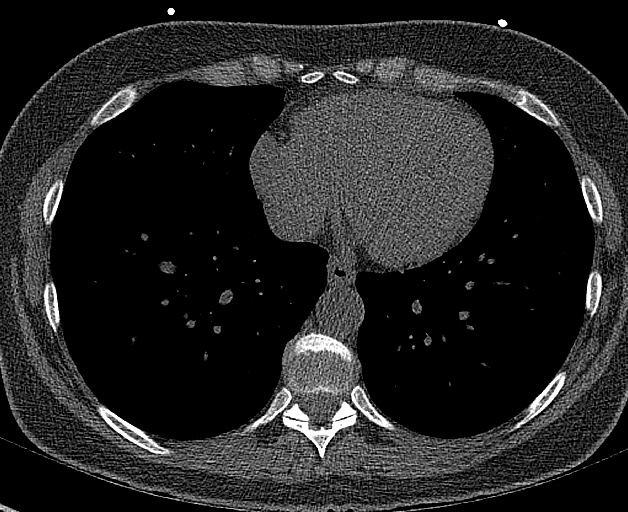
[im 47/70  vessel]
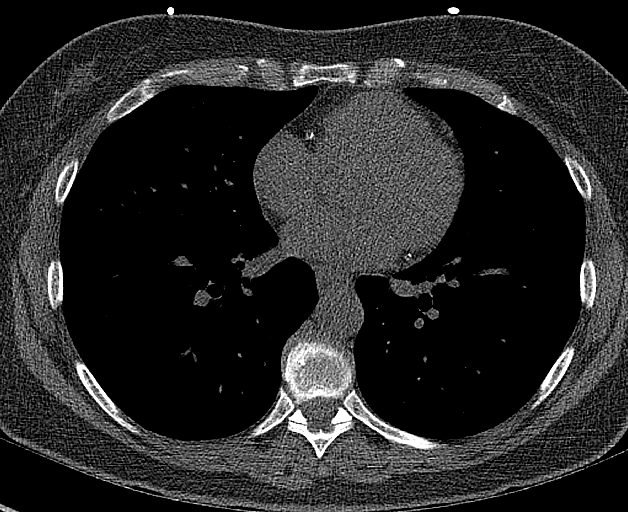
[im 58/70  vessel]
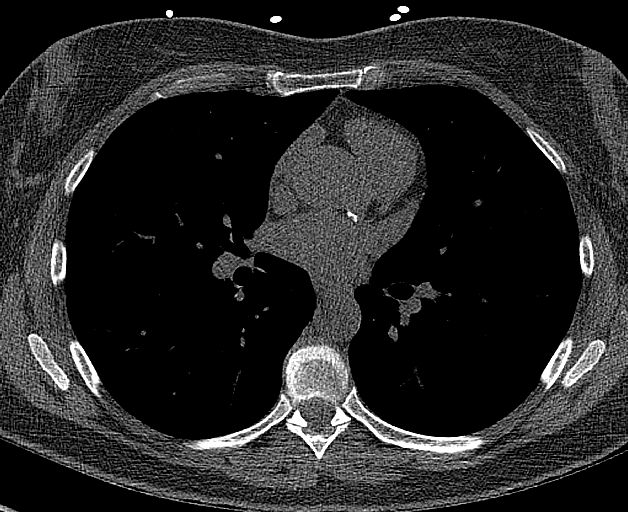

[14 of 20 positions shown; findings below may reference images not displayed]

FINDINGS: Technical quality: Good.

CORONARY CALCIUM

Total Agatston Score: 125 with calcifications noted in the left
main, left anterior descending, and right coronary arteries.

[HOSPITAL] percentile:  93

OTHER FINDINGS:

Cardiovascular: Heart is normal size. Visualized aorta is normal
caliber.

Mediastinum/Nodes: No adenopathy in the lower mediastinum or hila.

Lungs/Pleura: Visualized lungs clear.  No effusions.

Upper Abdomen: Imaging into the upper abdomen shows no acute
findings.

Musculoskeletal: Chest wall soft tissues are unremarkable. No acute
bony abnormality.
IMPRESSION: The observed calcium score of 125 is at the percentile 93 for
subjects of the same age, gender and race/ethnicity who are free of
clinical cardiovascular disease and treated diabetes.

No acute or significant extracardiac abnormality.

## 2020-01-02 DIAGNOSIS — F411 Generalized anxiety disorder: Secondary | ICD-10-CM | POA: Insufficient documentation

## 2020-02-11 HISTORY — PX: LEFT HEART CATH AND CORONARY ANGIOGRAPHY: CATH118249

## 2020-02-11 HISTORY — PX: TRANSTHORACIC ECHOCARDIOGRAM: SHX275

## 2020-02-16 HISTORY — PX: TRANSTHORACIC ECHOCARDIOGRAM: SHX275

## 2020-02-18 DIAGNOSIS — I2510 Atherosclerotic heart disease of native coronary artery without angina pectoris: Secondary | ICD-10-CM | POA: Insufficient documentation

## 2020-02-18 HISTORY — DX: Atherosclerotic heart disease of native coronary artery without angina pectoris: I25.10

## 2020-02-23 HISTORY — PX: OTHER SURGICAL HISTORY: SHX169

## 2020-03-01 HISTORY — PX: LEFT HEART CATH AND CORONARY ANGIOGRAPHY: CATH118249

## 2020-03-13 HISTORY — PX: CORONARY ARTERY BYPASS GRAFT: SHX141

## 2020-03-19 DIAGNOSIS — Z951 Presence of aortocoronary bypass graft: Secondary | ICD-10-CM

## 2020-03-19 HISTORY — DX: Presence of aortocoronary bypass graft: Z95.1

## 2020-10-29 DIAGNOSIS — E559 Vitamin D deficiency, unspecified: Secondary | ICD-10-CM | POA: Insufficient documentation

## 2020-10-29 DIAGNOSIS — M8589 Other specified disorders of bone density and structure, multiple sites: Secondary | ICD-10-CM | POA: Insufficient documentation

## 2020-10-29 DIAGNOSIS — E538 Deficiency of other specified B group vitamins: Secondary | ICD-10-CM | POA: Insufficient documentation

## 2020-10-29 DIAGNOSIS — E049 Nontoxic goiter, unspecified: Secondary | ICD-10-CM | POA: Insufficient documentation

## 2020-10-29 DIAGNOSIS — Z8659 Personal history of other mental and behavioral disorders: Secondary | ICD-10-CM | POA: Insufficient documentation

## 2021-06-17 ENCOUNTER — Ambulatory Visit (INDEPENDENT_AMBULATORY_CARE_PROVIDER_SITE_OTHER): Payer: BC Managed Care – PPO | Admitting: Cardiology

## 2021-06-17 ENCOUNTER — Encounter: Payer: Self-pay | Admitting: Cardiology

## 2021-06-17 VITALS — BP 120/90 | HR 78 | Ht 62.0 in | Wt 144.8 lb

## 2021-06-17 DIAGNOSIS — Z8249 Family history of ischemic heart disease and other diseases of the circulatory system: Secondary | ICD-10-CM | POA: Diagnosis not present

## 2021-06-17 DIAGNOSIS — I255 Ischemic cardiomyopathy: Secondary | ICD-10-CM | POA: Insufficient documentation

## 2021-06-17 DIAGNOSIS — E7849 Other hyperlipidemia: Secondary | ICD-10-CM | POA: Diagnosis not present

## 2021-06-17 DIAGNOSIS — R0609 Other forms of dyspnea: Secondary | ICD-10-CM

## 2021-06-17 DIAGNOSIS — I2510 Atherosclerotic heart disease of native coronary artery without angina pectoris: Secondary | ICD-10-CM | POA: Diagnosis not present

## 2021-06-17 DIAGNOSIS — Z951 Presence of aortocoronary bypass graft: Secondary | ICD-10-CM | POA: Insufficient documentation

## 2021-06-17 DIAGNOSIS — I9581 Postprocedural hypotension: Secondary | ICD-10-CM

## 2021-06-17 HISTORY — DX: Presence of aortocoronary bypass graft: Z95.1

## 2021-06-17 HISTORY — DX: Ischemic cardiomyopathy: I25.5

## 2021-06-17 MED ORDER — MIDODRINE HCL 5 MG PO TABS: ORAL_TABLET | ORAL | Status: DC

## 2021-06-17 NOTE — Patient Instructions (Addendum)
Medication Instructions:  ?Decrease Midodrine in evening  to 1/2 tablet  ? ?*If you need a refill on your cardiac medications before your next appointment, please call your pharmacy* ? ? ?Lab Work: ? ?If you have labs (blood work) drawn today and your tests are completely normal, you will receive your results only by: ?MyChart Message (if you have MyChart) OR ?A paper copy in the mail ?If you have any lab test that is abnormal or we need to change your treatment, we will call you to review the results. ? ? ?Testing/Procedures: ? ?Will be schedul at [address] ? ?Your physician has requested that you have an echocardiogram. Echocardiography is a painless test that uses sound waves to create images of your heart. It provides your doctor with information about the size and shape of your heart and how well your heart?s chambers and valves are working. This procedure takes approximately one hour. There are no restrictions for this procedure. ? ? ? ?Follow-Up: ?At [facility], you and your health needs are our priority.  As part of our continuing mission to provide you with exceptional heart care, we have created designated Provider Care Teams.  These Care Teams include your primary Cardiologist (physician) and Advanced Practice Providers (APPs -  Physician Assistants and Nurse Practitioners) who all work together to provide you with the care you need, when you need it. ? ?  ? ?Your next appointment:   ?6 month(s) ? ?The format for your next appointment:   ?In Person ? ?Provider:   ?None  ? ? ?Other Instructions  ?

## 2021-06-17 NOTE — Progress Notes (Signed)
? ? ?Primary Care Provider: Cleatis Polka., MD ?[facility] Cardiologist: Bryan Lemma, MD ?Electrophysiologist: None ? ?Clinic Note: ?No chief complaint on file. ? ?=================================== ? ?ASSESSMENT/PLAN  ? ?Problem List Items Addressed This Visit   ? ?  ? Cardiology Problems  ? Coronary artery disease involving native coronary artery of native heart without angina pectoris - Primary (Chronic)  ?  Clearly, her abnormal Coronary Calcium Score from 2019 showed progression of disease when she had an abnormal Myoview in 2022 related to cath and then CABG.  By report there was three-vessel disease, but when they told her the bypass was done they bypassed for arteries.  Likely at least LIMA probably least one of the right coronary artery is more the OM is actually diagonal.  We will try to get CABG report. ? ?Plan: ?Continue statin along with aspirin or Plavix-we may be stop aspirin but I like to see what the studies show. ?Request outside records for cath report and CABG report as well as echo and stress test. ?Establish new baseline EF of echo. ?She has issues with hypotension as it was not on a beta-blocker or ARB/ACE inhibitor.   ?She is, in fact, on midodrine-citing issues with hypotension post CABG.   ?Would like to see if wean the midodrine off ?  ?  ? Relevant Medications  ? aspirin 81 MG EC tablet  ? midodrine (PROAMATINE) 5 MG tablet  ? Other Relevant Orders  ? EKG 12-Lead  ? ECHOCARDIOGRAM COMPLETE  ? Ischemic cardiomyopathy (Chronic)  ?  By her report, she had an abnormal echocardiogram.  The plan was done to reassess the echocardiogram a year out from her CABG, this was not done because of her move.  I would like to get a baseline echocardiogram on her here but would also like to get the outside records as well. ? ?If she does have a EF, she really has NYHA class I symptoms of heart failure. ? ?Plan: ?Establish new baseline with 2D echocardiogram (I would like to wait until we get  previous records before this is done so we have a baseline to compare to. ?Obtain outside records of previous echo, stress test, And CABG reports. ?She is only on aspirin and Plavix as well as statin-is actually hypotensive on midodrine. ?  ?  ? Relevant Medications  ? aspirin 81 MG EC tablet  ? midodrine (PROAMATINE) 5 MG tablet  ? Other Relevant Orders  ? EKG 12-Lead  ? ECHOCARDIOGRAM COMPLETE  ? Hyperlipidemia due to dietary fat intake (Chronic)  ?  Unfortunate, I do not have any labs to start with.  She is due to see her PCP to establish care with Dr. Clelia Croft in July.  She will have labs drawn then. ? ?Target LDL is less than 70.  Was 76 in 2019.  Back in 2022 LDL was 74 having bumped up from 64. ?She is now on rosuvastatin 20 mg. ? ?  ?  ? Relevant Medications  ? aspirin 81 MG EC tablet  ? midodrine (PROAMATINE) 5 MG tablet  ? Other Relevant Orders  ? EKG 12-Lead  ? ECHOCARDIOGRAM COMPLETE  ? Hypotension after procedure (Chronic)  ?  She was hypotensive following her CABG, and is now still on midodrine 5 mg twice daily. ?Plan: We will have her wean down midodrine to take half a tablet.  In the evening, if doing okay after couple weeks, she can reduce the daytime dose as well to half tablet. ? ?Would  like to see where she stands on no medication. ? ?  ?  ? Relevant Medications  ? aspirin 81 MG EC tablet  ? midodrine (PROAMATINE) 5 MG tablet  ?  ? Other  ? Hx of CABG x 4 (Chronic)  ?  CABG x4 for asymptomatic multivessel CAD. ?Need to get CABG report.  Would probably be due for follow-up ischemic evaluation in 2026 unless symptoms warrant.  Interestingly, she had not had symptoms prior to her CABG.  This was triggered following routine surveillance screening with stress test and echocardiogram. ? ?She really did not notice any change in symptoms since CABG.  Maybe a little more energy. ? ?  ?  ? Relevant Orders  ? EKG 12-Lead  ? ECHOCARDIOGRAM COMPLETE  ? Family history of premature coronary artery disease (Chronic)   ?  She is now joint her mother and brother with CAD. ? ?On statin as well as aspirin Plavix. ? ?Has been hypotensive, therefore on midodrine as opposed to beta-blocker, ARB or calcium channel blocker  ? ?  ?  ? Relevant Orders  ? EKG 12-Lead  ? ECHOCARDIOGRAM COMPLETE  ? DOE (dyspnea on exertion) (Chronic)  ?  She has had exertional dyspnea in the past, but really minimal.  Probably more related to fatigue and dyspnea. ? ?  ?  ? Relevant Orders  ? EKG 12-Lead  ? ECHOCARDIOGRAM COMPLETE  ? ? ?=================================== ? ?HPI:   ? ?Heather Lucas is a 61 y.o. female with a history of CAD-CABG (found during routine surveillance testing with Echocardiogram and Myoview that was abnormal leading to cardiac catheterization while living in [place]-screening because of family history) who is being seen today TO ESTABLISH CARDIOLOGY CARE at the request of Cleatis Polka., MD. ? ?Heather Lucas was last seen back in July 2019 for evaluation of abnormal Coronary Calcium Score and fatigue.  She subsequently moved down to [place] to live near her brother after mother died here.   ?She recently moved back to [place] [facility] where she was born and raised after living in [place] about the last 2 and half years.  While she was there she was diagnosed with multivessel CAD and what sounds like cardiomyopathy with reduced EF on echo leading to stress test-Then finally CABG.  She never had any chest pain or pressure with these conditions-no chest pain or pressure and no heart patient is a PAD, orthopnea.  Just fatigue is a chronic issue. ? ?Recent Hospitalizations: None ? ?Reviewed  CV studies:   ? ?The following studies were reviewed today: (if available, images/films reviewed: From Epic Chart or Care Everywhere) ?Unfortunate no reports are available.=> She has had several echocardiograms, but has not had one done for the posthospital follow-up after CABG that was scheduled to be done in February of this  year.;  She had a Myoview and echocardiogram prior to CABG that led to her cath finding multivessel disease. ? ? ?Interval History:  ? ?Heather Lucas presents here today to establish cardiology care.  I have not seen her in about 4 years.  In the interim, she had progression of her CAD to the point of having multivessel disease, and Leading to CABG.  Interestingly, she is remained asymptomatic.  She does have chronic fatigue, and said that her energy level improved after CABG but really has not had any chest pain/pressure or dyspnea with rest or exertion more than 1 would expect routine exercise. ?She does not have any exercise routine and is  somewhat sedentary.  She does have some exercise intolerance partly because she is not very active. ? ?Relatively asymptomatic regarding standpoint: CV Review of Symptoms (Summary) ?CVROS: : no chest pain or dyspnea on exertion ?positive for - \-Some exercise tolerance, mostly because she does not exercise routinely. ?negative for - edema, irregular heartbeat, orthopnea, palpitations, paroxysmal nocturnal dyspnea, rapid heart rate, shortness of breath, or syncope/near syncope or TIA/amaurosis fugax claudication ? ?REVIEWED OF SYSTEMS  ? ?Review of Systems  ?Constitutional:  Negative for malaise/fatigue (Does not have a lot of energy, but is not very active.) and weight loss.  ?HENT:  Positive for congestion. Negative for nosebleeds.   ?Respiratory:  Negative for cough, shortness of breath and wheezing.   ?Cardiovascular:   ?     Negative per HPI  ?Gastrointestinal:  Negative for blood in stool and melena.  ?Genitourinary:  Negative for hematuria.  ?Musculoskeletal:  Negative for falls, joint pain, myalgias and neck pain.  ?Neurological:  Negative for dizziness, weakness and headaches.  ?Psychiatric/Behavioral: Negative.    ? ?I have reviewed and (if needed) personally updated the patient's problem list, medications, allergies, past medical and surgical history, social and  family history.  ? ?PAST MEDICAL HISTORY  ? ?Past Medical History:  ?Diagnosis Date  ? Agatston coronary artery calcium score between 100 and 199 08/26/2017  ? Borderline hyperlipidemia   ? as of 10/2016: TC 134

## 2021-06-18 ENCOUNTER — Encounter: Payer: Self-pay | Admitting: Cardiology

## 2021-06-18 DIAGNOSIS — I9581 Postprocedural hypotension: Secondary | ICD-10-CM | POA: Insufficient documentation

## 2021-06-18 NOTE — Assessment & Plan Note (Signed)
She has had exertional dyspnea in the past, but really minimal.  Probably more related to fatigue and dyspnea. ?

## 2021-06-18 NOTE — Assessment & Plan Note (Signed)
She is now joint her mother and brother with CAD. ? ?On statin as well as aspirin Plavix. ? ?Has been hypotensive, therefore on midodrine as opposed to beta-blocker, ARB or calcium channel blocker  ?

## 2021-06-18 NOTE — Assessment & Plan Note (Addendum)
Unfortunate, I do not have any labs to start with.  She is due to see her PCP to establish care with Dr. Brigitte Pulse in July.  She will have labs drawn then. ? ?Target LDL is less than 70.  Was 27 in 2019.  Back in 2022 LDL was 74 having bumped up from 64. ?She is now on rosuvastatin 20 mg. ?

## 2021-06-18 NOTE — Assessment & Plan Note (Signed)
CABG x4 for asymptomatic multivessel CAD. ?Need to get CABG report.  Would probably be due for follow-up ischemic evaluation in 2026 unless symptoms warrant.  Interestingly, she had not had symptoms prior to her CABG.  This was triggered following routine surveillance screening with stress test and echocardiogram. ? ?She really did not notice any change in symptoms since CABG.  Maybe a little more energy. ?

## 2021-06-18 NOTE — Assessment & Plan Note (Addendum)
By her report, she had an abnormal echocardiogram.  The plan was done to reassess the echocardiogram a year out from her CABG, this was not done because of her move.  I would like to get a baseline echocardiogram on her here but would also like to get the outside records as well. ? ?If she does have a EF, she really has NYHA class I symptoms of heart failure. ? ?Plan: ?? Establish new baseline with 2D echocardiogram (I would like to wait until we get previous records before this is done so we have a baseline to compare to. ?? Obtain outside records of previous echo, stress test, And CABG reports. ?? She is only on aspirin and Plavix as well as statin-is actually hypotensive on midodrine. ?

## 2021-06-18 NOTE — Assessment & Plan Note (Signed)
She was hypotensive following her CABG, and is now still on midodrine 5 mg twice daily. ?Plan: We will have her wean down midodrine to take half a tablet.  In the evening, if doing okay after couple weeks, she can reduce the daytime dose as well to half tablet. ? ?Would like to see where she stands on no medication. ?

## 2021-06-18 NOTE — Assessment & Plan Note (Signed)
Clearly, her abnormal Coronary Calcium Score from 2019 showed progression of disease when she had an abnormal Myoview in 2022 related to cath and then CABG.  By report there was three-vessel disease, but when they told her the bypass was done they bypassed for arteries.  Likely at least LIMA probably least one of the right coronary artery is more the OM is actually diagonal.  We will try to get CABG report. ? ?Plan: ?? Continue statin along with aspirin or Plavix-we may be stop aspirin but I like to see what the studies show. ?? Request outside records for cath report and CABG report as well as echo and stress test. ?? Establish new baseline EF of echo. ?? She has issues with hypotension as it was not on a beta-blocker or ARB/ACE inhibitor.   ?? She is, in fact, on midodrine-citing issues with hypotension post CABG.   ?? Would like to see if wean the midodrine off ?

## 2021-07-11 HISTORY — PX: TRANSTHORACIC ECHOCARDIOGRAM: SHX275

## 2021-07-19 ENCOUNTER — Ambulatory Visit (HOSPITAL_COMMUNITY): Payer: BC Managed Care – PPO | Attending: Internal Medicine

## 2021-07-19 DIAGNOSIS — I2510 Atherosclerotic heart disease of native coronary artery without angina pectoris: Secondary | ICD-10-CM | POA: Diagnosis not present

## 2021-07-19 DIAGNOSIS — R0609 Other forms of dyspnea: Secondary | ICD-10-CM

## 2021-07-19 DIAGNOSIS — Z951 Presence of aortocoronary bypass graft: Secondary | ICD-10-CM

## 2021-07-19 DIAGNOSIS — I255 Ischemic cardiomyopathy: Secondary | ICD-10-CM | POA: Diagnosis not present

## 2021-07-19 DIAGNOSIS — E7849 Other hyperlipidemia: Secondary | ICD-10-CM | POA: Diagnosis not present

## 2021-07-19 DIAGNOSIS — Z8249 Family history of ischemic heart disease and other diseases of the circulatory system: Secondary | ICD-10-CM | POA: Diagnosis not present

## 2021-07-19 LAB — ECHOCARDIOGRAM COMPLETE
Area-P 1/2: 5.66 cm2
S' Lateral: 2.1 cm

## 2021-07-19 MED ORDER — PERFLUTREN LIPID MICROSPHERE
1.0000 mL | INTRAVENOUS | Status: AC | PRN
  Administered 2021-07-19: 2 mL via INTRAVENOUS

## 2021-07-29 DIAGNOSIS — Z6826 Body mass index (BMI) 26.0-26.9, adult: Secondary | ICD-10-CM | POA: Diagnosis not present

## 2021-07-29 DIAGNOSIS — Z1231 Encounter for screening mammogram for malignant neoplasm of breast: Secondary | ICD-10-CM | POA: Diagnosis not present

## 2021-07-29 DIAGNOSIS — Z1151 Encounter for screening for human papillomavirus (HPV): Secondary | ICD-10-CM | POA: Diagnosis not present

## 2021-07-29 DIAGNOSIS — Z124 Encounter for screening for malignant neoplasm of cervix: Secondary | ICD-10-CM | POA: Diagnosis not present

## 2021-07-29 DIAGNOSIS — Z01419 Encounter for gynecological examination (general) (routine) without abnormal findings: Secondary | ICD-10-CM | POA: Diagnosis not present

## 2021-08-02 ENCOUNTER — Ambulatory Visit (INDEPENDENT_AMBULATORY_CARE_PROVIDER_SITE_OTHER): Payer: BC Managed Care – PPO | Admitting: Adult Health

## 2021-08-02 ENCOUNTER — Encounter: Payer: Self-pay | Admitting: Adult Health

## 2021-08-02 DIAGNOSIS — F331 Major depressive disorder, recurrent, moderate: Secondary | ICD-10-CM

## 2021-08-02 DIAGNOSIS — F411 Generalized anxiety disorder: Secondary | ICD-10-CM | POA: Diagnosis not present

## 2021-08-02 DIAGNOSIS — G2401 Drug induced subacute dyskinesia: Secondary | ICD-10-CM | POA: Diagnosis not present

## 2021-08-02 DIAGNOSIS — F32A Depression, unspecified: Secondary | ICD-10-CM | POA: Insufficient documentation

## 2021-08-02 DIAGNOSIS — F410 Panic disorder [episodic paroxysmal anxiety] without agoraphobia: Secondary | ICD-10-CM

## 2021-08-02 MED ORDER — ALPRAZOLAM 0.5 MG PO TABS
0.5000 mg | ORAL_TABLET | Freq: Two times a day (BID) | ORAL | 2 refills | Status: DC | PRN
Start: 2021-08-02 — End: 2021-09-02

## 2021-08-06 ENCOUNTER — Telehealth: Payer: Self-pay | Admitting: Adult Health

## 2021-08-14 ENCOUNTER — Telehealth: Payer: Self-pay | Admitting: Adult Health

## 2021-08-14 NOTE — Telephone Encounter (Signed)
Noted. Ty!

## 2021-08-14 NOTE — Telephone Encounter (Signed)
Ok to taper off - that was the plan.

## 2021-08-14 NOTE — Telephone Encounter (Signed)
Pt stated she wants to come off Abilify and olanzapine.She is having problems at work and it makes her very sleepy.She wants to be added to the cancellation list as well for a sooner appt.

## 2021-08-14 NOTE — Telephone Encounter (Signed)
Please let me know instructions to taper off

## 2021-08-14 NOTE — Telephone Encounter (Signed)
Pt Lvm at  9:56a.  She said she wants to come off 2 meds that are making her feel bad and making it hard to concentrate at work.  She did not name them.    Next appt 7/10

## 2021-08-14 NOTE — Telephone Encounter (Signed)
Just stop the Abilify and Olanzapine - is she feeling better with going down on them?

## 2021-08-14 NOTE — Telephone Encounter (Signed)
Pt informed.She said yes she feels a little better coming down on the med

## 2021-08-15 NOTE — Telephone Encounter (Signed)
Spoke to pt today, she is fine to wait unitl appt on Mon, 7/10

## 2021-08-16 DIAGNOSIS — E559 Vitamin D deficiency, unspecified: Secondary | ICD-10-CM | POA: Diagnosis not present

## 2021-08-16 DIAGNOSIS — E785 Hyperlipidemia, unspecified: Secondary | ICD-10-CM | POA: Diagnosis not present

## 2021-08-19 ENCOUNTER — Encounter: Payer: Self-pay | Admitting: Adult Health

## 2021-08-19 ENCOUNTER — Ambulatory Visit (INDEPENDENT_AMBULATORY_CARE_PROVIDER_SITE_OTHER): Payer: BC Managed Care – PPO | Admitting: Adult Health

## 2021-08-19 DIAGNOSIS — F410 Panic disorder [episodic paroxysmal anxiety] without agoraphobia: Secondary | ICD-10-CM

## 2021-08-19 DIAGNOSIS — F411 Generalized anxiety disorder: Secondary | ICD-10-CM

## 2021-08-19 DIAGNOSIS — G2401 Drug induced subacute dyskinesia: Secondary | ICD-10-CM | POA: Diagnosis not present

## 2021-08-19 DIAGNOSIS — F331 Major depressive disorder, recurrent, moderate: Secondary | ICD-10-CM

## 2021-08-19 MED ORDER — VORTIOXETINE HBR 10 MG PO TABS: 10.0000 mg | ORAL_TABLET | Freq: Every day | ORAL | 2 refills | Status: DC

## 2021-08-19 NOTE — Progress Notes (Signed)
Cadie Sorci [id number] 1960-06-05 61 y.o.  Subjective:   Patient ID:  Heather Lucas is a 61 y.o. (DOB 1960-10-25) female.  Chief Complaint: No chief complaint on file.   HPI Nyema Hachey presents to the office today for follow-up of MDD, GAD, panic attacks and TD.  Describes mood today as "so-so". Pleasant. Denies tearfulness. Mood symptoms - reports decreased depression - a little bit. Denies irritability. Feels more anxious overall - "hard to work and focus". Reports panic attacks - none recently. Mood is stable - consistent. Has tapered off the Olanzapine and Abilify TD is better - still reports mouth movements, hands shaking, leg movements. Would like to restart theTrintellix - worked well for her. Recently moved back to from  [place] to [place] - for work. Working full time - boss is her best friend. Attends church. Stable interest and motivation. Taking medications as prescribed.  Energy levels lower in the mornings and gets better throughout the day. Active, does not have a regular exercise routine - starting back again.   Enjoys some usual interests and activities. Single. Lives alone. Brother in [place]. Friends local. Attends church. Appetite adequate. Weight stable - 145.2 pounds. Sleeps well most nights. Averages 10 or more hours - has a difficult time getting up. Focus and concentration stable. Completing tasks. Managing aspects of household. Works full times as an Conservator, museum/gallery. 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  Denies AH or VH. Denies self harm. Denies substance use.  Previous medication trials:  Zoloft, Prozac, Wellbutrin, Lexapro, Trintellix, Hydroxyzine.    Review of Systems:  Review of Systems  Musculoskeletal:  Negative for gait problem.  Neurological:  Negative for tremors.  Psychiatric/Behavioral:         Please refer to HPI    Medications: I have reviewed the patient's current medications.  Current Outpatient Medications  Medication Sig Dispense Refill    vortioxetine HBr (TRINTELLIX) 10 MG TABS tablet Take 1 tablet (10 mg total) by mouth daily. 30 tablet 2   ALPRAZolam (XANAX) 0.5 MG tablet Take 1 tablet (0.5 mg total) by mouth 2 (two) times daily as needed for anxiety. 60 tablet 2   aspirin 81 MG EC tablet Take 81 mg by mouth daily.     clopidogrel (PLAVIX) 75 MG tablet Take 75 mg by mouth daily.     ergocalciferol (VITAMIN D2) 1.25 MG (50000 UT) capsule Take 50,000 Units by mouth daily.     midodrine (PROAMATINE) 5 MG tablet Take 5 mg  in the morning  and take 2.5 mg ( 1/2 tablet ) in the evening.     rosuvastatin (CRESTOR) 20 MG tablet Take 20 mg by mouth daily.  5   Sennosides 15 MG TABS Take 15 mg by mouth daily. Takes 8 tablets a night     No current facility-administered medications for this visit.    Medication Side Effects: None  Allergies: No Known Allergies  Past Medical History:  Diagnosis Date   Agatston coronary artery calcium score between 100 and 199 08/26/2017   Borderline hyperlipidemia    as of 10/2016: TC 134, TG 90, HDL 51, LDL 95 (PCP recently increase rosuvastatin dose from 10 to 20 mg)   Borderline hypertension    Coronary artery disease involving native coronary artery of native heart without angina pectoris 02/18/2020   Family history of premature coronary artery disease 08/26/2017   Hx of CABG x 4 06/17/2021   Hyperlipidemia due to dietary fat intake 08/26/2017   Ischemic cardiomyopathy 06/17/2021    Past Medical  History, Surgical history, Social history, and Family history were reviewed and updated as appropriate.   Please see review of systems for further details on the patient's review from today.   Objective:   Physical Exam:  There were no vitals taken for this visit.  Physical Exam Constitutional:      General: She is not in acute distress. Musculoskeletal:        General: No deformity.  Neurological:     Mental Status: She is alert and oriented to person, place, and time.     Coordination:  Coordination normal.  Psychiatric:        Attention and Perception: Attention and perception normal. She does not perceive auditory or visual hallucinations.        Mood and Affect: Mood normal. Mood is not anxious or depressed. Affect is not labile, blunt, angry or inappropriate.        Speech: Speech normal.        Behavior: Behavior normal.        Thought Content: Thought content normal. Thought content is not paranoid or delusional. Thought content does not include homicidal or suicidal ideation. Thought content does not include homicidal or suicidal plan.        Cognition and Memory: Cognition and memory normal.        Judgment: Judgment normal.     Comments: Insight intact     Lab Review:     Component Value Date/Time   NA 141 10/16/2017 1412   K 4.6 10/16/2017 1412   CL 102 10/16/2017 1412   CO2 25 10/16/2017 1412   GLUCOSE 86 10/16/2017 1412   BUN 14 10/16/2017 1412   CREATININE 0.79 10/16/2017 1412   CALCIUM 9.0 10/16/2017 1412   GFRNONAA 83 10/16/2017 1412   GFRAA 95 10/16/2017 1412    No results found for: "WBC", "RBC", "HGB", "HCT", "PLT", "MCV", "MCH", "MCHC", "RDW", "LYMPHSABS", "MONOABS", "EOSABS", "BASOSABS"  No results found for: "POCLITH", "LITHIUM"   No results found for: "PHENYTOIN", "PHENOBARB", "VALPROATE", "CBMZ"   .res Assessment: Plan:    Plan:  PDMP reviewed  Continue Xanax 0.5mg  BID Add Trintellix 5mg  daily x 2 weeks, then increase to 10mg  daily- samples given - script sent  Patient willing to consider Ingrezza - will discuss further at next visit - symptoms improved.  Time spent with patient was 25 minutes. Greater than 50% of face to face time with patient was spent on counseling and coordination of care.    RTC 3 weeks.  Patient advised to contact office with any questions, adverse effects, or acute worsening in signs and symptoms.   Discussed potential benefits, risk, and side effects of benzodiazepines to include potential risk of  tolerance and dependence, as well as possible drowsiness.  Advised patient not to drive if experiencing drowsiness and to take lowest possible effective dose to minimize risk of dependence and tolerance.   Discussed potential metabolic side effects associated with atypical antipsychotics, as well as potential risk for movement side effects. Advised pt to contact office if movement side effects occur.   Diagnoses and all orders for this visit:  Major depressive disorder, recurrent episode, moderate (HCC) -     vortioxetine HBr (TRINTELLIX) 10 MG TABS tablet; Take 1 tablet (10 mg total) by mouth daily.  Generalized anxiety disorder -     vortioxetine HBr (TRINTELLIX) 10 MG TABS tablet; Take 1 tablet (10 mg total) by mouth daily.  Panic attacks  Tardive dyskinesia     Please see  After Visit Summary for patient specific instructions.  Future Appointments  Date Time Provider Department Center  09/16/2021 12:00 PM Dimitris Shanahan, Thereasa Solo, NP [facility] None  12/20/2021  1:20 PM Marykay Lex, MD [facility]    No orders of the defined types were placed in this encounter.   -------------------------------

## 2021-08-21 ENCOUNTER — Telehealth: Payer: Self-pay | Admitting: Adult Health

## 2021-08-21 DIAGNOSIS — Z0000 Encounter for general adult medical examination without abnormal findings: Secondary | ICD-10-CM | POA: Diagnosis not present

## 2021-08-21 DIAGNOSIS — R7301 Impaired fasting glucose: Secondary | ICD-10-CM | POA: Diagnosis not present

## 2021-08-21 DIAGNOSIS — R82998 Other abnormal findings in urine: Secondary | ICD-10-CM | POA: Diagnosis not present

## 2021-08-21 DIAGNOSIS — I10 Essential (primary) hypertension: Secondary | ICD-10-CM | POA: Diagnosis not present

## 2021-08-21 DIAGNOSIS — Z1331 Encounter for screening for depression: Secondary | ICD-10-CM | POA: Diagnosis not present

## 2021-08-21 NOTE — Telephone Encounter (Signed)
Next visit is 09/16/21. Heather Lucas called and said that the medication she was given on her last visit is not making her feel better. She missed work today because of it not helping her. Could someone please call her at [phone]. She didn't state which medication it was on the message.   Pharmacy is:  [facility] - [place], [place] - [address]  Phone:  [phone]  Fax:  [phone]

## 2021-08-21 NOTE — Telephone Encounter (Signed)
LVM to RC. Patient was seen on 7/10 and given samples of Trintellix, so she has taken for 2 days at most.

## 2021-08-22 NOTE — Telephone Encounter (Signed)
Told patient to give the Trintellix more time. She is going to start seeing a therapist in our office.

## 2021-08-22 NOTE — Telephone Encounter (Signed)
Left another VM to RC.  

## 2021-08-26 ENCOUNTER — Ambulatory Visit (INDEPENDENT_AMBULATORY_CARE_PROVIDER_SITE_OTHER): Payer: BC Managed Care – PPO | Admitting: Mental Health

## 2021-08-26 ENCOUNTER — Telehealth: Payer: Self-pay

## 2021-08-26 DIAGNOSIS — F331 Major depressive disorder, recurrent, moderate: Secondary | ICD-10-CM

## 2021-08-26 DIAGNOSIS — F411 Generalized anxiety disorder: Secondary | ICD-10-CM | POA: Diagnosis not present

## 2021-08-26 NOTE — Progress Notes (Signed)
[facility] Counsel[facility] Initial Adult Exam  Name: Heather Lucas Date: 08/26/2021 MRN: [id number] DOB: 08-18-1960 PCP: Heather Polka., MD  Time spent:  50 minutes   Reason for Visit Heather Lucas Problem:  Patient reports coping with Tardive Dyskinesia and is currently in care with Heather Rack, NP.  She recently moved back to this area in January from [place] as she lived there for 2 years. Reports coping with anxiety daily, worries about work "I worry I might lose my job".  She is rx'd medications and is the process of getting on a consistent regimen again.  She spoke w/ her boss  who assured her she is not going to lose her job. 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 She lost her job in [place], which she feels increased her anxiety related to work; her brother continues to live in [place].  She divorced about 30 years ago, which is when she had significant anxiety at that time. Reports low energy currently, panic attacks on occasion-shortness of breath only sx reported.  She stayed in bed the entire weekend, "It feels safe"- has done this on weekends for the past 2 years. Sleeps about 10 hours/night; states she often sleeps to cope with the anxiety.  Prior to that she would go out with friends, clean house etc. Feels fatigued (recent physical from PCP indicated WNL labs) daily. Last week she worked 2 and a half days of 5 due to the fatigue and anxiety.  She has missed some day/s of work most weeks for the past 6 months. She had fear of her last job that she was not doing the job correctly or sufficiently which caused anxiety (also occurred at current job). 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  Currently like her job and wants to improve functioning toward attending work consistently.      Mental Status Exam:    Appearance:    Casual     Behavior:   Appropriate  Motor:   WNL  Speech/Language:    Clear and Coherent  Affect:    Full range   Mood:   anxiety  Thought process:   Logical, linear, goal directed  Thought content:     WNL  Sensory/Perceptual disturbances:     none  Orientation:   x4  Attention:   Good  Concentration:   Good  Memory:   Intact  Fund of knowledge:    Consistent with age and development  Insight:     Good  Judgment:    Good  Impulse Control:   Good     Reported Symptoms:  daily (10/10), depression (3/10)  Risk Assessment: Danger to Self:  No Self-injurious Behavior: No Danger to Others: No Duty to Warn:no Physical Aggression / Violence:No  Access to Firearms a concern: No  Gang Involvement:No  Patient / guardian was educated about steps to take if suicide or homicide risk level increases between visits: yes While future psychiatric events cannot be accurately predicted, the patient does not currently require acute inpatient psychiatric care and does not currently meet [facility] involuntary commitment criteria.  Substance Abuse History: Current substance abuse: No     Past Psychiatric History:   Outpatient Providers: therapy x 3 visits 10 years ago History of Psych Hospitalization: none Psychological Testing:none   Family History:  Raised by both parents, deceased  Father- Jun 01, 2010, mother2018-04-21.  Patient cared for them both prior to passing. Brother- Heather Lucas (  lives in [place])  Family History  Problem Relation Age of Onset   Heart attack Mother 62       Recently died; age 47.   Cancer Father    Heart attack Brother    CAD Brother    Heart attack Maternal Grandmother    Diabetes Maternal Grandmother    Colon cancer Paternal Grandmother    Other Paternal Grandfather        Circulatory problems    Living situation: the patient lives alone  Sexual Orientation:  Straight  Relationship Status: Divorced             If a parent, number of children / ages: none  Support Systems; family, friends  Surveyor, quantity Stress:  yes  Income/Employment/Disability:  full  time  Financial planner: No   Educational History: Education: HS Diploma  Stressors:  interpersonal  Strengths:  Family and Friends  Barriers:  interpersonal   Legal History: Pending legal issue / charges: none  Medical History/Surgical History: Past Medical History:  Diagnosis Date   Agatston coronary artery calcium score between 100 and 199 08/26/2017   Borderline hyperlipidemia    as of 10/2016: TC 134, TG 90, HDL 51, LDL 95 (PCP recently increase rosuvastatin dose from 10 to 20 mg)   Borderline hypertension    Coronary artery disease involving native coronary artery of native heart without angina pectoris 02/18/2020   Family history of premature coronary artery disease 08/26/2017   Hx of CABG x 4 06/17/2021   Hyperlipidemia due to dietary fat intake 08/26/2017   Ischemic cardiomyopathy 06/17/2021    Past Surgical History:  Procedure Laterality Date   CORONARY ARTERY BYPASS GRAFT  03/2020   [facility] [place], [place] -> reportedly CABG x4   LAPAROSCOPIC CHOLECYSTECTOMY  1995   LEFT HEART CATH AND CORONARY ANGIOGRAPHY  02/2020   [facility] [place], [place]) -> multivessel disease, referred for CABG   NM MYOVIEW LTD  02/2018   Clearly appears abnormal, referred for Heather Lucas, [place]   TRANSTHORACIC ECHOCARDIOGRAM  02/2020   Several echoes in January and February 2022-initially noted to be abnormal along with Myoview stress test, immediately post CABG recheck    Medications: Current Outpatient Medications  Medication Sig Dispense Refill   ALPRAZolam (XANAX) 0.5 MG tablet Take 1 tablet (0.5 mg total) by mouth 2 (two) times daily as needed for anxiety. 60 tablet 2   aspirin 81 MG EC tablet Take 81 mg by mouth daily.     clopidogrel (PLAVIX) 75 MG tablet Take 75 mg by mouth daily.     ergocalciferol (VITAMIN D2) 1.25 MG (50000 UT) capsule Take 50,000 Units by mouth daily.     midodrine (PROAMATINE) 5 MG tablet Take 5 mg  in the morning  and take 2.5  mg ( 1/2 tablet ) in the evening.     rosuvastatin (CRESTOR) 20 MG tablet Take 20 mg by mouth daily.  5   Sennosides 15 MG TABS Take 15 mg by mouth daily. Takes 8 tablets a night     vortioxetine HBr (TRINTELLIX) 10 MG TABS tablet Take 1 tablet (10 mg total) by mouth daily. 30 tablet 2   No current facility-administered medications for this visit.    No Known Allergies  Diagnoses:    ICD-10-CM   1. Generalized anxiety disorder  F41.1     2. Major depressive disorder, recurrent episode, moderate (HCC)  F33.1       Plan of Care: TBD   Waldron Session,  [facility]

## 2021-08-26 NOTE — Telephone Encounter (Signed)
Prior Authorization initiated and submitted for TRINTELLIX 10 MG effective 08/26/2021-08/25/2022 with [facility]

## 2021-08-28 ENCOUNTER — Other Ambulatory Visit: Payer: Self-pay

## 2021-08-28 ENCOUNTER — Telehealth: Payer: Self-pay | Admitting: Adult Health

## 2021-08-28 DIAGNOSIS — G2401 Drug induced subacute dyskinesia: Secondary | ICD-10-CM

## 2021-08-28 MED ORDER — VALBENAZINE TOSYLATE 40 MG PO CAPS: 40.0000 mg | ORAL_CAPSULE | Freq: Every day | ORAL | 0 refills | Status: DC

## 2021-08-28 NOTE — Telephone Encounter (Signed)
Let's get the Ingrezza started - 40mg  she has TD.

## 2021-08-28 NOTE — Telephone Encounter (Signed)
Pulled samples, notified patient. Rx sent to generate PA request.

## 2021-08-28 NOTE — Telephone Encounter (Signed)
Patient said she isn't sure what she needs but she is really struggling and needs some help. She said she has missed 2-1/2 days of work this week and she is worried about losing her job. She can't be still. She is sleeping well but has a hard time waking up. The alprazolam helps some and she is taking 2 a day. She drinks 5-6 caffeinated sodas a day, but said she had been doing that for a long time. In your last note it mentions discussing Ingrezza at her next visit. She has an appt in 3 weeks. Do you want me to see if we can get her in sooner or start a medication.

## 2021-08-28 NOTE — Telephone Encounter (Signed)
Pt called at 11 am and said that her TD is really bothering her. She said her anxiety is off the charts. She would to start on medicine for the TD now instead of waiting until next visit 8/7. She missed work today and a half day on Monday. Please call her at [phone]

## 2021-09-02 ENCOUNTER — Other Ambulatory Visit: Payer: Self-pay

## 2021-09-02 ENCOUNTER — Telehealth: Payer: Self-pay

## 2021-09-02 ENCOUNTER — Telehealth: Payer: Self-pay | Admitting: Adult Health

## 2021-09-02 ENCOUNTER — Other Ambulatory Visit: Payer: Self-pay | Admitting: Adult Health

## 2021-09-02 DIAGNOSIS — F331 Major depressive disorder, recurrent, moderate: Secondary | ICD-10-CM

## 2021-09-02 DIAGNOSIS — G2401 Drug induced subacute dyskinesia: Secondary | ICD-10-CM

## 2021-09-02 DIAGNOSIS — F411 Generalized anxiety disorder: Secondary | ICD-10-CM

## 2021-09-02 MED ORDER — VALBENAZINE TOSYLATE 40 MG PO CAPS: 40.0000 mg | ORAL_CAPSULE | Freq: Every day | ORAL | 0 refills | Status: DC

## 2021-09-02 MED ORDER — ALPRAZOLAM 0.5 MG PO TABS: 0.5000 mg | ORAL_TABLET | Freq: Three times a day (TID) | ORAL | 2 refills | Status: DC | PRN

## 2021-09-02 NOTE — Telephone Encounter (Signed)
Prior Authorization submitted and approved for INGREZZA 40 MG effective 09/02/2021-11/30/2021 with [employer]  Rx sent to [facility].  Will contact pharmacy with approval.

## 2021-09-02 NOTE — Telephone Encounter (Signed)
She still wants to speak to you.Should we add her to cancellation list?

## 2021-09-02 NOTE — Telephone Encounter (Signed)
Pt called back to add that she has not been able to work for the past 5 days

## 2021-09-02 NOTE — Telephone Encounter (Signed)
I asked pt what she meant by not feeling good and she said it's hard to explain.She is taking the trintellix and ingrezza.She said she is restless and constantly pacing.Xanax helps a little.She is requesting a sooner appt because she wants to speak to you.

## 2021-09-02 NOTE — Telephone Encounter (Signed)
Pt LVM at 10:32a.  She said she is "not feeling real good on the meds" Almira Coaster has her on.  Wants to know if she can see Almira Coaster today.  [facility] doesn't have any openings.  Maybe you can call to see what else can be done in the short term.  Next appt 8/7

## 2021-09-02 NOTE — Telephone Encounter (Signed)
We can write her out of work if needed.

## 2021-09-02 NOTE — Telephone Encounter (Signed)
Called and spoke with patient.

## 2021-09-03 ENCOUNTER — Telehealth: Payer: Self-pay | Admitting: Adult Health

## 2021-09-03 NOTE — Telephone Encounter (Signed)
Heather Lucas came in and dropped off an Initial Disability Form to be completed. Placed in [name]'s box.

## 2021-09-04 NOTE — Telephone Encounter (Signed)
Heather Lucas has mailed her Ingrezza 40 mg to her.

## 2021-09-05 ENCOUNTER — Telehealth: Payer: Self-pay | Admitting: Adult Health

## 2021-09-05 NOTE — Telephone Encounter (Signed)
Pt called this am checking on the status of the disability form she dropped off. She wants to talk to traci about it. Please give her a call at [phone]

## 2021-09-05 NOTE — Telephone Encounter (Signed)
Noted. Ty!

## 2021-09-08 ENCOUNTER — Telehealth: Payer: Self-pay | Admitting: Psychiatry

## 2021-09-08 DIAGNOSIS — F331 Major depressive disorder, recurrent, moderate: Secondary | ICD-10-CM

## 2021-09-08 DIAGNOSIS — F411 Generalized anxiety disorder: Secondary | ICD-10-CM

## 2021-09-08 MED ORDER — MIRTAZAPINE 15 MG PO TABS: 15.0000 mg | ORAL_TABLET | Freq: Every day | ORAL | 1 refills | Status: DC

## 2021-09-08 MED ORDER — CLONAZEPAM 1 MG PO TABS: ORAL_TABLET | ORAL | 0 refills | Status: DC

## 2021-09-08 NOTE — Telephone Encounter (Signed)
Received call from pt. She reports that she is still "fidgety" with klonopin. She reports that Klonopin causes her to feel sleepy, however she is unable to rest due to feeling restless. She reports that Klonopin is minimally effective for restlessness and is continuing to pace.   Discussed akathisia can occur with discontinuation of antipsychotics, often 2 weeks after discontinuation. Discussed that Allena Earing can also potentially cause akathisia.   She reports that things seemed to be improving late yesterday and then today is no better.   She reports that TD with orofacial and leg movements are slightly improved but not resolved.   Staffed case with pt's provider, Yvette Rack, DNP. Will start Mirtazapine 15 mg po QHS to possible improve akathisia. Discussed plan with pt and that her usual provider would follow-up with her tomorrow. Pt advised to call back with any questions or worsening symptoms.

## 2021-09-08 NOTE — Telephone Encounter (Signed)
Received after hours call from pt reports that she has not been feeling well and has been having difficulty sleeping.  She reports that she will "walk the floors" and has been feeling extremely restless.  She reports that she recently stopped Lexapro and then stopped olanzapine and Abilify and has been off of these medications for a couple of weeks. She reports that she started Trintellix about 2 weeks ago and started [place] about 1-1/2 weeks ago.  Discussed that she is describing symptoms consistent with akathisia.  Discussed that it is rare for antidepressants to cause akathisia, however it is possible that Trintellix could be causing akathisia.  Recommend stopping Trintellix.  Discussed treatment options for akathisia.  She reports that she has a history of open heart surgery and that her blood pressure and heart rate run low and she takes midodrine.  Discussed that  beta-blockers are used for treatment akathisia, however these would be contraindicated for her since they can further lower blood pressure and heart rate.  Discussed clonazepam is used also for akathisia and may be able to give her some relief of akathisia for a longer period of time compared to Xanax.  She does report that she experiences temporary relief after taking Xanax.  Will send in 3-day supply of clonazepam for her to take in place of Xanax.  Discussed that clonazepam may cause drowsiness and not to combine with Xanax.   prescription of clonazepam called in to [facility] pharmacy and advised patient to stop taking Trintellix until she can follow-up with her regular provider on Monday.  Advised patient to follow up if symptoms worsen or do not improve.

## 2021-09-09 ENCOUNTER — Telehealth: Payer: Self-pay | Admitting: Adult Health

## 2021-09-09 ENCOUNTER — Ambulatory Visit: Payer: BC Managed Care – PPO | Admitting: Mental Health

## 2021-09-09 DIAGNOSIS — F331 Major depressive disorder, recurrent, moderate: Secondary | ICD-10-CM | POA: Diagnosis not present

## 2021-09-09 DIAGNOSIS — F411 Generalized anxiety disorder: Secondary | ICD-10-CM | POA: Diagnosis not present

## 2021-09-09 NOTE — Telephone Encounter (Signed)
Can we follow up with patient this morning?

## 2021-09-09 NOTE — Telephone Encounter (Signed)
Pt checking status on STD brought in 7/25. Pt stated she did not work that day at all and has not worked since. Next apt with RM 8/7. Need faxed ASAP. Any questions call pt @ [phone].

## 2021-09-09 NOTE — Telephone Encounter (Deleted)
This has been addressed in another note.

## 2021-09-09 NOTE — Telephone Encounter (Signed)
Called and spoke with patient. Tolerated Remeron - started last night and slept better. Still having issues with akathesia - restless and pacing the room.

## 2021-09-09 NOTE — Telephone Encounter (Signed)
Please see message to F/U on STD.

## 2021-09-09 NOTE — Progress Notes (Signed)
[facility]  Name: Heather Lucas Date: 09/09/2021 MRN: [id number] DOB: 1960/09/27 PCP: Cleatis Polka., MD  Time spent:  54 minutes  Treatment:  Ind. therapy  Mental Status Exam:    Appearance:    Casual     Behavior:   Appropriate  Motor:   WNL  Speech/Language:    Clear and Coherent  Affect:   Full range   Mood:   anxious  Thought process:   Logical, linear, goal directed  Thought content:     WNL  Sensory/Perceptual disturbances:     none  Orientation:   x4  Attention:   Good  Concentration:   Good  Memory:   Intact  Fund of knowledge:    Consistent with age and development  Insight:     Good  Judgment:    Good  Impulse Control:   Good     Reported Symptoms:  anxiety daily (10/10), depression (3/10)  Risk Assessment: Danger to Self:  No Self-injurious Behavior: No Danger to Others: No Duty to Warn:no Physical Aggression / Violence:No  Access to Firearms a concern: No  Gang Involvement:No  Patient / guardian was educated about steps to take if suicide or homicide risk level increases between visits: yes While future psychiatric events cannot be accurately predicted, the patient does not currently require acute inpatient psychiatric care and does not currently meet [facility] involuntary commitment criteria.  Medical History/Surgical History: Past Medical History:  Diagnosis Date   Agatston coronary artery calcium score between 100 and 199 08/26/2017   Borderline hyperlipidemia    as of 10/2016: TC 134, TG 90, HDL 51, LDL 95 (PCP recently increase rosuvastatin dose from 10 to 20 mg)   Borderline hypertension    Coronary artery disease involving native coronary artery of native heart without angina pectoris 02/18/2020   Family history of premature coronary artery disease 08/26/2017   Hx of CABG x 4 06/17/2021   Hyperlipidemia due to dietary fat intake 08/26/2017   Ischemic cardiomyopathy 06/17/2021    Past Surgical History:  Procedure  Laterality Date   CORONARY ARTERY BYPASS GRAFT  03/2020   [facility] [place], [place] -> reportedly CABG x4   LAPAROSCOPIC CHOLECYSTECTOMY  1995   LEFT HEART CATH AND CORONARY ANGIOGRAPHY  02/2020   [facility] [place], [place]) -> multivessel disease, referred for CABG   NM MYOVIEW LTD  02/2018   Clearly appears abnormal, referred for [facility], [place]   TRANSTHORACIC ECHOCARDIOGRAM  02/2020   Several echoes in January and February 2022-initially noted to be abnormal along with Myoview stress test, immediately post CABG recheck    Medications: Current Outpatient Medications  Medication Sig Dispense Refill   ALPRAZolam (XANAX) 0.5 MG tablet Take 1 tablet (0.5 mg total) by mouth 3 (three) times daily as needed for anxiety. 90 tablet 2   aspirin 81 MG EC tablet Take 81 mg by mouth daily.     clonazePAM (KLONOPIN) 1 MG tablet Take 1/2-1 tab po TID prn anxiety or restlessness 10 tablet 0   clopidogrel (PLAVIX) 75 MG tablet Take 75 mg by mouth daily.     ergocalciferol (VITAMIN D2) 1.25 MG (50000 UT) capsule Take 50,000 Units by mouth daily.     midodrine (PROAMATINE) 5 MG tablet Take 5 mg  in the morning  and take 2.5 mg ( 1/2 tablet ) in the evening.     mirtazapine (REMERON) 15 MG tablet Take 1 tablet (15 mg total) by mouth at bedtime. 30  tablet 1   rosuvastatin (CRESTOR) 20 MG tablet Take 20 mg by mouth daily.  5   Sennosides 15 MG TABS Take 15 mg by mouth daily. Takes 8 tablets a night     valbenazine (INGREZZA) 40 MG capsule Take 1 capsule (40 mg total) by mouth daily. 30 capsule 0   vortioxetine HBr (TRINTELLIX) 10 MG TABS tablet Take 1 tablet (10 mg total) by mouth daily. 30 tablet 2   No current facility-administered medications for this visit.     Subjective: Patient presents for session on time.  Continue to assess needs.  She reports she had a difficult last few days going on to share how her symptoms associated with tardive dyskinesia increased.   She stated that she has been pacing much more frequently, reports a higher level of anxiety.  Stated she reached out to our after hours phone line and was able to have one of her medications increased which she feels have been helpful, however, she continues the frequent pacing which is also displayed throughout the session.  Patient paced throughout the session, sitting down for a couple of minutes at a time.  We reviewed some content from initial visit related to her anxiety.  She stated that she has applied for short-term disability, her last day of work being last Tuesday.  She has fears of losing her job due to having to be out currently.  Facilitated her identifying associated thoughts, reports that she tends to perform tasks at work quickly, at times leaving errors.  She stated at her last job while living in [place], she works in a call center environment where she was again, going too quickly through the tasks of work, ending her calls too soon although her company had informed her over time that she needed to slow down the pace herself.  She states her tendency to "hurry through" tasks been an issue for many years, also at home where she may complete various tasks quickly to get them completed.  Explored coping collaboratively, discussed diaphragmatic breathing, integrating mindfulness concepts discussed with some detail today.  The patient through exercises and encouraged her to continue at minimum 3 times per day. Discussed also mindfulness to target at least 2 behaviors/day.  Interventions:  assessment, cbt,   Diagnoses:    ICD-10-CM   1. Generalized anxiety disorder  F41.1     2. Major depressive disorder, recurrent episode, moderate (HCC)  F33.1         Plan: Patient is to use CBT, mindfulness and coping skills to help manage decrease symptoms associated with their diagnosis.   Long-term goals:   Maintain symptom reduction: The patient will report sustained reduction in symptoms of  anxiety using both CBT and mindfulness interventions for 3 consecutive months progressively.  Improve emotional regulation: The patient will learn and apply CBT and mindfulness-based strategies to regulate emotions, such as mindfulness-based stress reduction and cognitive restructuring, and report an improvement in emotional regulation for at least 3 consecutive months progressively.  Short-term goal:  The patient will learn and apply CBT and mindfulness-based coping skills for managing anxiety and practice using it between sessions.       2.   Increase awareness of automatic thoughts: The patient will learn to identify automatic thoughts and increase awareness of their impact on emotions and behaviors through both CBT and mindfulness-based interventions.       3.    Identify, challenge, and replace fearful/anxious/depressive self talk with positive, realistic, and empowering self talk  that does not feed anxiety nor depression.      Waldron Session, [facility]

## 2021-09-09 NOTE — Telephone Encounter (Signed)
Can you follow up on this please. °

## 2021-09-10 ENCOUNTER — Other Ambulatory Visit: Payer: Self-pay | Admitting: Psychiatry

## 2021-09-10 NOTE — Telephone Encounter (Signed)
Form completed. Will get faxed today

## 2021-09-10 NOTE — Telephone Encounter (Signed)
Forms faxed today

## 2021-09-11 ENCOUNTER — Telehealth: Payer: Self-pay | Admitting: Adult Health

## 2021-09-11 NOTE — Telephone Encounter (Signed)
Please advise if we can send more klonopin.Jessica sent a 3 day supply in place of xanax

## 2021-09-11 NOTE — Telephone Encounter (Signed)
Pt called requesting RF on Clonazepam. Only given 10 over weekend and ran out Tuesday. Has apt 8/7 need enough to last until apt. [employer].

## 2021-09-11 NOTE — Telephone Encounter (Signed)
Next visit is 09/16/21.Heather Lucas is requesting a refill on her Clonazepam 1 mg. She is totally out of this medication. Pharmacy is:  [facility] - [place], [place] - [address]  Phone:  [phone]  Fax:  [phone]

## 2021-09-11 NOTE — Telephone Encounter (Signed)
Patient called and said that she is out of her clonazepam and needs it filled. Has 2 pills left. Pharmacy is:  [facility] - [place], [place] - [address]  Phone:  [phone]  Fax:  [phone]

## 2021-09-11 NOTE — Telephone Encounter (Signed)
Pended.

## 2021-09-11 NOTE — Telephone Encounter (Signed)
Please approve

## 2021-09-11 NOTE — Telephone Encounter (Signed)
Pt called and said that she is out of her clonazapam and needs it filled asap

## 2021-09-11 NOTE — Telephone Encounter (Signed)
Ok to pend if it is helping her.

## 2021-09-13 NOTE — Telephone Encounter (Signed)
Completed and faxed on 09/10/2021

## 2021-09-16 ENCOUNTER — Encounter: Payer: Self-pay | Admitting: Adult Health

## 2021-09-16 ENCOUNTER — Ambulatory Visit: Payer: BC Managed Care – PPO | Admitting: Adult Health

## 2021-09-16 DIAGNOSIS — F411 Generalized anxiety disorder: Secondary | ICD-10-CM

## 2021-09-16 DIAGNOSIS — F331 Major depressive disorder, recurrent, moderate: Secondary | ICD-10-CM | POA: Diagnosis not present

## 2021-09-16 DIAGNOSIS — G2571 Drug induced akathisia: Secondary | ICD-10-CM

## 2021-09-16 DIAGNOSIS — G2401 Drug induced subacute dyskinesia: Secondary | ICD-10-CM

## 2021-09-16 DIAGNOSIS — F410 Panic disorder [episodic paroxysmal anxiety] without agoraphobia: Secondary | ICD-10-CM | POA: Diagnosis not present

## 2021-09-16 MED ORDER — MIRTAZAPINE 15 MG PO TABS
15.0000 mg | ORAL_TABLET | Freq: Every day | ORAL | 1 refills | Status: DC
Start: 2021-09-16 — End: 2021-10-11

## 2021-09-16 MED ORDER — CLONAZEPAM 1 MG PO TABS: ORAL_TABLET | ORAL | 1 refills | Status: DC

## 2021-09-16 MED ORDER — BENZTROPINE MESYLATE 0.5 MG PO TABS: ORAL_TABLET | ORAL | 2 refills | Status: DC

## 2021-09-16 NOTE — Progress Notes (Signed)
Heather Lucas [id number] 04-25-1960 61 y.o.  Subjective:   Patient ID:  Heather Lucas is a 61 y.o. (DOB 19-Dec-1960) female.  Chief Complaint: No chief complaint on file.   HPI Heather Lucas presents to the office today for follow-up of MDD, GAD, panic attacks and TD.  Describes mood today as "so-so". Pleasant. Denies tearfulness. Mood symptoms - reports decreased depression - not feeling sad or crying. Denies irritability. Feels anxious - some worry and rumination. Reports panic attacks - none recently. Mood is about the same - consistent. Continues to reports TD and akathesia - mouth movements, hands shaking, leg movements - sitting briefly and having to start moving again. Recently stopped the Trintellix and started the Remeron - tolerating. Also taking the Clonazepam 1mg  three times daily as needed. Unable to attends church or get out - "unable to control my restlessness". Stable interest and motivation. Taking medications as prescribed.  Energy levels lower. Active, does not have a regular exercise routine - pacing. Enjoys some usual interests and activities. Single. Lives alone. Brother in . Friends local. Attends church. Appetite adequate. Weight stable - 145.2 pounds. Sleeps better some nights than others. Sleeps 8 hours - 4 during the day and 4 at night. Focus and concentration stable. Completing tasks. Managing aspects of household. Works full times as an [place]. 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  Denies AH or VH. Denies self harm. Denies substance use.  Previous medication trials:  Zoloft, Prozac, Wellbutrin, Lexapro, Trintellix, Hydroxyzine.   Review of Systems:  Review of Systems  Musculoskeletal:  Negative for gait problem.  Neurological:  Negative for tremors.  Psychiatric/Behavioral:         Please refer to HPI    Medications: I have reviewed the patient's current medications.  Current Outpatient Medications  Medication Sig Dispense Refill   ALPRAZolam (XANAX) 0.5 MG  tablet Take 1 tablet (0.5 mg total) by mouth 3 (three) times daily as needed for anxiety. 90 tablet 2   aspirin 81 MG EC tablet Take 81 mg by mouth daily.     clonazePAM (KLONOPIN) 1 MG tablet TAKE 1/2 TO 1 TABLET 3 TIMES A DAY AS NEEDED FOR RESTLESSNESS/ANXIETY. 21 tablet 1   clopidogrel (PLAVIX) 75 MG tablet Take 75 mg by mouth daily.     ergocalciferol (VITAMIN D2) 1.25 MG (50000 UT) capsule Take 50,000 Units by mouth daily.     midodrine (PROAMATINE) 5 MG tablet Take 5 mg  in the morning  and take 2.5 mg ( 1/2 tablet ) in the evening.     mirtazapine (REMERON) 15 MG tablet Take 1 tablet (15 mg total) by mouth at bedtime. 30 tablet 1   rosuvastatin (CRESTOR) 20 MG tablet Take 20 mg by mouth daily.  5   Sennosides 15 MG TABS Take 15 mg by mouth daily. Takes 8 tablets a night     valbenazine (INGREZZA) 40 MG capsule Take 1 capsule (40 mg total) by mouth daily. 30 capsule 0   vortioxetine HBr (TRINTELLIX) 10 MG TABS tablet Take 1 tablet (10 mg total) by mouth daily. 30 tablet 2   No current facility-administered medications for this visit.    Medication Side Effects: None  Allergies: No Known Allergies  Past Medical History:  Diagnosis Date   Agatston coronary artery calcium score between 100 and 199 08/26/2017   Borderline hyperlipidemia    as of 10/2016: TC 134, TG 90, HDL 51, LDL 95 (PCP recently increase rosuvastatin dose from 10 to 20 mg)   Borderline hypertension  Coronary artery disease involving native coronary artery of native heart without angina pectoris 02/18/2020   Family history of premature coronary artery disease 08/26/2017   Hx of CABG x 4 06/17/2021   Hyperlipidemia due to dietary fat intake 08/26/2017   Ischemic cardiomyopathy 06/17/2021    Past Medical History, Surgical history, Social history, and Family history were reviewed and updated as appropriate.   Please see review of systems for further details on the patient's review from today.   Objective:   Physical  Exam:  There were no vitals taken for this visit.  Physical Exam Constitutional:      General: She is not in acute distress. Musculoskeletal:        General: No deformity.  Neurological:     Mental Status: She is alert and oriented to person, place, and time.     Coordination: Coordination normal.  Psychiatric:        Attention and Perception: Attention and perception normal. She does not perceive auditory or visual hallucinations.        Mood and Affect: Mood normal. Mood is not anxious or depressed. Affect is not labile, blunt, angry or inappropriate.        Speech: Speech normal.        Behavior: Behavior normal.        Thought Content: Thought content normal. Thought content is not paranoid or delusional. Thought content does not include homicidal or suicidal ideation. Thought content does not include homicidal or suicidal plan.        Cognition and Memory: Cognition and memory normal.        Judgment: Judgment normal.     Comments: Insight intact     Lab Review:     Component Value Date/Time   NA 141 10/16/2017 1412   K 4.6 10/16/2017 1412   CL 102 10/16/2017 1412   CO2 25 10/16/2017 1412   GLUCOSE 86 10/16/2017 1412   BUN 14 10/16/2017 1412   CREATININE 0.79 10/16/2017 1412   CALCIUM 9.0 10/16/2017 1412   GFRNONAA 83 10/16/2017 1412   GFRAA 95 10/16/2017 1412    No results found for: "WBC", "RBC", "HGB", "HCT", "PLT", "MCV", "MCH", "MCHC", "RDW", "LYMPHSABS", "MONOABS", "EOSABS", "BASOSABS"  No results found for: "POCLITH", "LITHIUM"   No results found for: "PHENYTOIN", "PHENOBARB", "VALPROATE", "CBMZ"   .res Assessment: Plan:    Plan:  PDMP reviewed  Clonidine 1mg  TID Remeron 15mg  at hs Ingrezza 40mg  daily  Add Cogentin 0.5mg  BID for akathesia  Patient totally disabled 09/16/2021 thorugh 10/14/2021.   Time spent with patient was 25 minutes. Greater than 50% of face to face time with patient was spent on counseling and coordination of care.    RTC 2  weeks.  Patient advised to contact office with any questions, adverse effects, or acute worsening in signs and symptoms.   Discussed potential benefits, risk, and side effects of benzodiazepines to include potential risk of tolerance and dependence, as well as possible drowsiness.  Advised patient not to drive if experiencing drowsiness and to take lowest possible effective dose to minimize risk of dependence and tolerance.   Discussed potential metabolic side effects associated with atypical antipsychotics, as well as potential risk for movement side effects. Advised pt to contact office if movement side effects occur.    There are no diagnoses linked to this encounter.   Please see After Visit Summary for patient specific instructions.  Future Appointments  Date Time Provider Department Center  10/08/2021 10:00 AM 11/16/2021,  Cristal Deer, [facility] CP-CP None  10/16/2021  2:00 PM Waldron Session, [facility] CP-CP None  10/23/2021  3:00 PM Waldron Session, [facility] CP-CP None  10/30/2021  3:00 PM Waldron Session, [facility] CP-CP None  12/20/2021  1:20 PM Marykay Lex, MD [facility]    No orders of the defined types were placed in this encounter.   -------------------------------

## 2021-09-18 ENCOUNTER — Other Ambulatory Visit: Payer: Self-pay | Admitting: Adult Health

## 2021-09-18 DIAGNOSIS — G2401 Drug induced subacute dyskinesia: Secondary | ICD-10-CM

## 2021-09-30 ENCOUNTER — Ambulatory Visit: Payer: BC Managed Care – PPO | Admitting: Adult Health

## 2021-09-30 ENCOUNTER — Telehealth: Payer: Self-pay | Admitting: Adult Health

## 2021-09-30 ENCOUNTER — Encounter: Payer: Self-pay | Admitting: Adult Health

## 2021-09-30 DIAGNOSIS — F411 Generalized anxiety disorder: Secondary | ICD-10-CM

## 2021-09-30 DIAGNOSIS — G2571 Drug induced akathisia: Secondary | ICD-10-CM | POA: Diagnosis not present

## 2021-09-30 DIAGNOSIS — F331 Major depressive disorder, recurrent, moderate: Secondary | ICD-10-CM

## 2021-09-30 DIAGNOSIS — G2401 Drug induced subacute dyskinesia: Secondary | ICD-10-CM | POA: Diagnosis not present

## 2021-09-30 DIAGNOSIS — F410 Panic disorder [episodic paroxysmal anxiety] without agoraphobia: Secondary | ICD-10-CM

## 2021-09-30 NOTE — Progress Notes (Signed)
Elexa Kivi [id number] 1960/07/08 61 y.o.  Subjective:   Patient ID:  Heather Lucas is a 61 y.o. (DOB 29-Sep-1960) female.  Chief Complaint: No chief complaint on file.   HPI Heather Lucas presents to the office today for follow-up of MDD, GAD, panic attacks and TD.  Describes mood today as "so-so". Pleasant. Denies tearfulness. Mood symptoms - reports decreased depression. -  Denies irritability. Feels anxious - "still struggling with anxiety". Reports some worry and rumination. Denies recent panic attacks. Mood has improved - "getting better". Continues to reports TD and akathesia - mouth movements, hands shaking, leg movements - improved with addition of Cogentin - taking 0.5mg  at bedtime. Stable interest and motivation. Taking medications as prescribed.  Energy levels lower. Active, does not have a regular exercise routine. Enjoys some usual interests and activities. Single. Lives alone. Brother in [place]. Friends local. Attends church. Appetite adequate. Weight stable - 145.2 pounds. Sleeps better some nights than others. Sleeps 8 hours - 4 during the day and 4 at night. Focus and concentration stable. Completing tasks. Managing aspects of household. Works full times as an Conservator, museum/gallery. 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  Denies AH or VH. Denies self harm. Denies substance use.  Previous medication trials:  Zoloft, Prozac, Wellbutrin, Lexapro, Trintellix, Hydroxyzine.  Review of Systems:  Review of Systems  Musculoskeletal:  Negative for gait problem.  Neurological:  Negative for tremors.  Psychiatric/Behavioral:         Please refer to HPI    Medications: I have reviewed the patient's current medications.  Current Outpatient Medications  Medication Sig Dispense Refill   aspirin 81 MG EC tablet Take 81 mg by mouth daily.     benztropine (COGENTIN) 0.5 MG tablet Take one tablet at bedtime for one week, then increase to one tablet twice daily. 60 tablet 2   clonazePAM (KLONOPIN) 1 MG  tablet TAKE 1/2 TO 1 TABLET 3 TIMES A DAY AS NEEDED FOR RESTLESSNESS/ANXIETY. 21 tablet 1   clopidogrel (PLAVIX) 75 MG tablet Take 75 mg by mouth daily.     ergocalciferol (VITAMIN D2) 1.25 MG (50000 UT) capsule Take 50,000 Units by mouth daily.     midodrine (PROAMATINE) 5 MG tablet Take 5 mg  in the morning  and take 2.5 mg ( 1/2 tablet ) in the evening.     mirtazapine (REMERON) 15 MG tablet Take 1 tablet (15 mg total) by mouth at bedtime. 30 tablet 1   rosuvastatin (CRESTOR) 20 MG tablet Take 20 mg by mouth daily.  5   Sennosides 15 MG TABS Take 15 mg by mouth daily. Takes 8 tablets a night     valbenazine (INGREZZA) 40 MG capsule Take 1 capsule (40 mg total) by mouth daily. 30 capsule 0   No current facility-administered medications for this visit.    Medication Side Effects: None  Allergies: No Known Allergies  Past Medical History:  Diagnosis Date   Agatston coronary artery calcium score between 100 and 199 08/26/2017   Borderline hyperlipidemia    as of 10/2016: TC 134, TG 90, HDL 51, LDL 95 (PCP recently increase rosuvastatin dose from 10 to 20 mg)   Borderline hypertension    Coronary artery disease involving native coronary artery of native heart without angina pectoris 02/18/2020   Family history of premature coronary artery disease 08/26/2017   Hx of CABG x 4 06/17/2021   Hyperlipidemia due to dietary fat intake 08/26/2017   Ischemic cardiomyopathy 06/17/2021    Past Medical History, Surgical history, Social  history, and Family history were reviewed and updated as appropriate.   Please see review of systems for further details on the patient's review from today.   Objective:   Physical Exam:  There were no vitals taken for this visit.  Physical Exam Constitutional:      General: She is not in acute distress. Musculoskeletal:        General: No deformity.  Neurological:     Mental Status: She is alert and oriented to person, place, and time.     Coordination:  Coordination normal.  Psychiatric:        Attention and Perception: Attention and perception normal. She does not perceive auditory or visual hallucinations.        Mood and Affect: Mood normal. Mood is not anxious or depressed. Affect is not labile, blunt, angry or inappropriate.        Speech: Speech normal.        Behavior: Behavior normal.        Thought Content: Thought content normal. Thought content is not paranoid or delusional. Thought content does not include homicidal or suicidal ideation. Thought content does not include homicidal or suicidal plan.        Cognition and Memory: Cognition and memory normal.        Judgment: Judgment normal.     Comments: Insight intact     Lab Review:     Component Value Date/Time   NA 141 10/16/2017 1412   K 4.6 10/16/2017 1412   CL 102 10/16/2017 1412   CO2 25 10/16/2017 1412   GLUCOSE 86 10/16/2017 1412   BUN 14 10/16/2017 1412   CREATININE 0.79 10/16/2017 1412   CALCIUM 9.0 10/16/2017 1412   GFRNONAA 83 10/16/2017 1412   GFRAA 95 10/16/2017 1412    No results found for: "WBC", "RBC", "HGB", "HCT", "PLT", "MCV", "MCH", "MCHC", "RDW", "LYMPHSABS", "MONOABS", "EOSABS", "BASOSABS"  No results found for: "POCLITH", "LITHIUM"   No results found for: "PHENYTOIN", "PHENOBARB", "VALPROATE", "CBMZ"   .res Assessment: Plan:    Plan:  PDMP reviewed  Clonidine 1mg  TID Remeron 15mg  at hs Ingrezza 40mg  daily Cogentin 0.5mg  BID for akathesia  Patient totally disabled 09/16/2021 thorugh 10/14/2021.   Time spent with patient was 25 minutes. Greater than 50% of face to face time with patient was spent on counseling and coordination of care.    RTC 2 weeks.  Patient advised to contact office with any questions, adverse effects, or acute worsening in signs and symptoms.   Discussed potential benefits, risk, and side effects of benzodiazepines to include potential risk of tolerance and dependence, as well as possible drowsiness.  Advised  patient not to drive if experiencing drowsiness and to take lowest possible effective dose to minimize risk of dependence and tolerance.   Discussed potential metabolic side effects associated with atypical antipsychotics, as well as potential risk for movement side effects. Advised pt to contact office if movement side effects occur.    Diagnoses and all orders for this visit:  Major depressive disorder, recurrent episode, moderate (HCC)  Generalized anxiety disorder  Akathisia  Tardive dyskinesia  Panic attacks     Please see After Visit Summary for patient specific instructions.  Future Appointments  Date Time Provider Department Center  10/08/2021 10:00 AM 11/16/2021, [facility] CP-CP None  10/16/2021  2:00 PM Waldron Session, [facility] CP-CP None  10/23/2021  3:00 PM Waldron Session, [facility] CP-CP None  10/30/2021  3:00 PM Waldron Session, [facility] CP-CP None  12/20/2021  1:20 PM Marykay Lex, MD [facility]    No orders of the defined types were placed in this encounter.   -------------------------------

## 2021-09-30 NOTE — Telephone Encounter (Signed)
Noted thanks I will update her fmla

## 2021-09-30 NOTE — Telephone Encounter (Signed)
Toba dropped off an FMLA form to be completed for her Employer. Placed in [name]'s box to complete.

## 2021-10-02 NOTE — Telephone Encounter (Signed)
Form completed and given to Almira Coaster to review and sign, then I will fax it to the # given

## 2021-10-03 ENCOUNTER — Telehealth: Payer: Self-pay | Admitting: Adult Health

## 2021-10-03 NOTE — Telephone Encounter (Signed)
Notified Pt STD paperwork faxed today

## 2021-10-04 ENCOUNTER — Other Ambulatory Visit: Payer: Self-pay | Admitting: Adult Health

## 2021-10-04 DIAGNOSIS — G2401 Drug induced subacute dyskinesia: Secondary | ICD-10-CM

## 2021-10-08 ENCOUNTER — Ambulatory Visit: Payer: BC Managed Care – PPO | Admitting: Mental Health

## 2021-10-11 ENCOUNTER — Ambulatory Visit: Payer: BC Managed Care – PPO | Admitting: Adult Health

## 2021-10-11 ENCOUNTER — Encounter: Payer: Self-pay | Admitting: Adult Health

## 2021-10-11 DIAGNOSIS — F331 Major depressive disorder, recurrent, moderate: Secondary | ICD-10-CM

## 2021-10-11 DIAGNOSIS — G2401 Drug induced subacute dyskinesia: Secondary | ICD-10-CM | POA: Diagnosis not present

## 2021-10-11 DIAGNOSIS — G2571 Drug induced akathisia: Secondary | ICD-10-CM | POA: Diagnosis not present

## 2021-10-11 DIAGNOSIS — F411 Generalized anxiety disorder: Secondary | ICD-10-CM

## 2021-10-11 MED ORDER — MIRTAZAPINE 15 MG PO TABS
15.0000 mg | ORAL_TABLET | Freq: Every day | ORAL | 2 refills | Status: DC
Start: 2021-10-11 — End: 2022-01-10

## 2021-10-11 MED ORDER — BENZTROPINE MESYLATE 0.5 MG PO TABS: ORAL_TABLET | ORAL | 2 refills | Status: DC

## 2021-10-11 MED ORDER — CLONAZEPAM 1 MG PO TABS: ORAL_TABLET | ORAL | 0 refills | Status: DC

## 2021-10-11 NOTE — Progress Notes (Signed)
Heather Lucas [id number] Jan 10, 1961 61 y.o.  Subjective:   Patient ID:  Heather Lucas is a 61 y.o. (DOB 06/08/1960) female.  Chief Complaint: No chief complaint on file.   HPI Heather Lucas presents to the office today for follow-up of MDD, GAD, panic attacks and TD.  Describes mood today as "ok". Pleasant. Denies tearfulness. Mood symptoms - reports depression and irritability. Reports anxiety - improved. Reports some worry and rumination. Denies recent panic attacks. Mood has improved. Stating "I'm doing better". Decreased TD and akathesia - improved with medication. Stable interest and motivation. Taking medications as prescribed.  Energy levels slightly better. Active, does not have a regular exercise routine. Enjoys some usual interests and activities. Single. Lives alone. Brother in [place]. Friends local. Attends church. Appetite adequate. Weight stable - 145.2 pounds. Sleeps better some nights than others. Sleeps 8 hours - 4 hours during the day and 4 at night. Plans to work on sleeping only  Focus and concentration stable. Completing tasks. Managing aspects of household. Works full times as an Conservator, museum/gallery. 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  Denies AH or VH. Denies self harm. Denies substance use.  Previous medication trials:  Zoloft, Prozac, Wellbutrin, Lexapro, Trintellix, Hydroxyzine.   Review of Systems:  Review of Systems  Musculoskeletal:  Negative for gait problem.  Neurological:  Negative for tremors.  Psychiatric/Behavioral:         Please refer to HPI    Medications: I have reviewed the patient's current medications.  Current Outpatient Medications  Medication Sig Dispense Refill   aspirin 81 MG EC tablet Take 81 mg by mouth daily.     benztropine (COGENTIN) 0.5 MG tablet Take one tablet at bedtime for one week, then increase to one tablet twice daily. 60 tablet 2   clonazePAM (KLONOPIN) 1 MG tablet Take one tablet twice daily. 60 tablet 0   clopidogrel (PLAVIX) 75 MG  tablet Take 75 mg by mouth daily.     ergocalciferol (VITAMIN D2) 1.25 MG (50000 UT) capsule Take 50,000 Units by mouth daily.     INGREZZA 40 MG capsule TAKE 1 CAPSULE BY MOUTH DAILY 30 capsule 0   midodrine (PROAMATINE) 5 MG tablet Take 5 mg  in the morning  and take 2.5 mg ( 1/2 tablet ) in the evening.     mirtazapine (REMERON) 15 MG tablet Take 1 tablet (15 mg total) by mouth at bedtime. 30 tablet 2   rosuvastatin (CRESTOR) 20 MG tablet Take 20 mg by mouth daily.  5   Sennosides 15 MG TABS Take 15 mg by mouth daily. Takes 8 tablets a night     No current facility-administered medications for this visit.    Medication Side Effects: None  Allergies: No Known Allergies  Past Medical History:  Diagnosis Date   Agatston coronary artery calcium score between 100 and 199 08/26/2017   Borderline hyperlipidemia    as of 10/2016: TC 134, TG 90, HDL 51, LDL 95 (PCP recently increase rosuvastatin dose from 10 to 20 mg)   Borderline hypertension    Coronary artery disease involving native coronary artery of native heart without angina pectoris 02/18/2020   Family history of premature coronary artery disease 08/26/2017   Hx of CABG x 4 06/17/2021   Hyperlipidemia due to dietary fat intake 08/26/2017   Ischemic cardiomyopathy 06/17/2021    Past Medical History, Surgical history, Social history, and Family history were reviewed and updated as appropriate.   Please see review of systems for further details on the patient's  review from today.   Objective:   Physical Exam:  There were no vitals taken for this visit.  Physical Exam Constitutional:      General: She is not in acute distress. Musculoskeletal:        General: No deformity.  Neurological:     Mental Status: She is alert and oriented to person, place, and time.     Coordination: Coordination normal.  Psychiatric:        Attention and Perception: Attention and perception normal. She does not perceive auditory or visual hallucinations.         Mood and Affect: Mood normal. Mood is not anxious or depressed. Affect is not labile, blunt, angry or inappropriate.        Speech: Speech normal.        Behavior: Behavior normal.        Thought Content: Thought content normal. Thought content is not paranoid or delusional. Thought content does not include homicidal or suicidal ideation. Thought content does not include homicidal or suicidal plan.        Cognition and Memory: Cognition and memory normal.        Judgment: Judgment normal.     Comments: Insight intact     Lab Review:     Component Value Date/Time   NA 141 10/16/2017 1412   K 4.6 10/16/2017 1412   CL 102 10/16/2017 1412   CO2 25 10/16/2017 1412   GLUCOSE 86 10/16/2017 1412   BUN 14 10/16/2017 1412   CREATININE 0.79 10/16/2017 1412   CALCIUM 9.0 10/16/2017 1412   GFRNONAA 83 10/16/2017 1412   GFRAA 95 10/16/2017 1412    No results found for: "WBC", "RBC", "HGB", "HCT", "PLT", "MCV", "MCH", "MCHC", "RDW", "LYMPHSABS", "MONOABS", "EOSABS", "BASOSABS"  No results found for: "POCLITH", "LITHIUM"   No results found for: "PHENYTOIN", "PHENOBARB", "VALPROATE", "CBMZ"   .res Assessment: Plan:    Plan:  PDMP reviewed  Clonazepam 1mg  TID - takes one at bedtime. Remeron 15mg  at hs Ingrezza 40mg  daily Cogentin 0.5mg  BID for akathesia  Patient may return to work on 10/14/2021.   Time spent with patient was 25 minutes. Greater than 50% of face to face time with patient was spent on counseling and coordination of care.    RTC 2 weeks.  Patient advised to contact office with any questions, adverse effects, or acute worsening in signs and symptoms.   Discussed potential benefits, risk, and side effects of benzodiazepines to include potential risk of tolerance and dependence, as well as possible drowsiness.  Advised patient not to drive if experiencing drowsiness and to take lowest possible effective dose to minimize risk of dependence and tolerance.    Discussed potential metabolic side effects associated with atypical antipsychotics, as well as potential risk for movement side effects. Advised pt to contact office if movement side effects occur.   Diagnoses and all orders for this visit:  Major depressive disorder, recurrent episode, moderate (HCC) -     mirtazapine (REMERON) 15 MG tablet; Take 1 tablet (15 mg total) by mouth at bedtime.  Generalized anxiety disorder -     mirtazapine (REMERON) 15 MG tablet; Take 1 tablet (15 mg total) by mouth at bedtime. -     clonazePAM (KLONOPIN) 1 MG tablet; Take one tablet twice daily.  Tardive dyskinesia  Akathisia -     benztropine (COGENTIN) 0.5 MG tablet; Take one tablet at bedtime for one week, then increase to one tablet twice daily.  Please see After Visit Summary for patient specific instructions.  Future Appointments  Date Time Provider Department Lucas  10/16/2021  2:00 PM Heather Lucas, Heather Lucas [facility] None  10/23/2021  3:00 PM Heather Lucas, Heather Lucas [facility] None  10/30/2021  3:00 PM Heather Lucas, Heather Lucas [facility] None  12/20/2021  1:20 PM Heather Lex, MD [facility] None    No orders of the defined types were placed in this encounter.   -------------------------------

## 2021-10-16 ENCOUNTER — Ambulatory Visit: Payer: BC Managed Care – PPO | Admitting: Mental Health

## 2021-10-23 ENCOUNTER — Ambulatory Visit: Payer: BC Managed Care – PPO | Admitting: Mental Health

## 2021-10-23 DIAGNOSIS — F331 Major depressive disorder, recurrent, moderate: Secondary | ICD-10-CM

## 2021-10-23 NOTE — Progress Notes (Signed)
[facility] Note  Name: Kaiulani Sitton Date: 10/23/2021 MRN: [id number] DOB: 1960-03-19 PCP: Cleatis Polka., MD  Time spent:  54 minutes  Treatment:  Ind. therapy  Mental Status Exam:    Appearance:    Casual     Behavior:   Appropriate  Motor:   WNL  Speech/Language:    Clear and Coherent  Affect:   Full range   Mood:   anxious  Thought process:   Logical, linear, goal directed  Thought content:     WNL  Sensory/Perceptual disturbances:     none  Orientation:   x4  Attention:   Good  Concentration:   Good  Memory:   Intact  Fund of knowledge:    Consistent with age and development  Insight:     Good  Judgment:    Good  Impulse Control:   Good     Reported Symptoms:  anxiety daily (10/10), depression (3/10)  Risk Assessment: Danger to Self:  No Self-injurious Behavior: No Danger to Others: No Duty to Warn:no Physical Aggression / Violence:No  Access to Firearms a concern: No  Gang Involvement:No  Patient / guardian was educated about steps to take if suicide or homicide risk level increases between visits: yes While future psychiatric events cannot be accurately predicted, the patient does not currently require acute inpatient psychiatric care and does not currently meet [facility] involuntary commitment criteria.  Medical History/Surgical History: Past Medical History:  Diagnosis Date   Agatston coronary artery calcium score between 100 and 199 08/26/2017   Borderline hyperlipidemia    as of 10/2016: TC 134, TG 90, HDL 51, LDL 95 (PCP recently increase rosuvastatin dose from 10 to 20 mg)   Borderline hypertension    Coronary artery disease involving native coronary artery of native heart without angina pectoris 02/18/2020   Family history of premature coronary artery disease 08/26/2017   Hx of CABG x 4 06/17/2021   Hyperlipidemia due to dietary fat intake 08/26/2017   Ischemic cardiomyopathy 06/17/2021    Past Surgical History:  Procedure  Laterality Date   CORONARY ARTERY BYPASS GRAFT  03/2020   [facility], [place], [place] -> reportedly CABG x4   LAPAROSCOPIC CHOLECYSTECTOMY  1995   LEFT HEART CATH AND CORONARY ANGIOGRAPHY  02/2020   [facility] [place], [place]) -> multivessel disease, referred for CABG   NM MYOVIEW LTD  02/2018   Clearly appears abnormal, referred for [facility], [place]   TRANSTHORACIC ECHOCARDIOGRAM  02/2020   Several echoes in January and February 2022-initially noted to be abnormal along with Myoview stress test, immediately post CABG recheck    Medications: Current Outpatient Medications  Medication Sig Dispense Refill   aspirin 81 MG EC tablet Take 81 mg by mouth daily.     benztropine (COGENTIN) 0.5 MG tablet Take one tablet at bedtime for one week, then increase to one tablet twice daily. 60 tablet 2   clonazePAM (KLONOPIN) 1 MG tablet Take one tablet twice daily. 60 tablet 0   clopidogrel (PLAVIX) 75 MG tablet Take 75 mg by mouth daily.     ergocalciferol (VITAMIN D2) 1.25 MG (50000 UT) capsule Take 50,000 Units by mouth daily.     INGREZZA 40 MG capsule TAKE 1 CAPSULE BY MOUTH DAILY 30 capsule 0   midodrine (PROAMATINE) 5 MG tablet Take 5 mg  in the morning  and take 2.5 mg ( 1/2 tablet ) in the evening.     mirtazapine (REMERON) 15 MG tablet Take  1 tablet (15 mg total) by mouth at bedtime. 30 tablet 2   rosuvastatin (CRESTOR) 20 MG tablet Take 20 mg by mouth daily.  5   Sennosides 15 MG TABS Take 15 mg by mouth daily. Takes 8 tablets a night     No current facility-administered medications for this visit.     Subjective: Patient presents for session on time, dressed since last visit which was about a month ago.  Patient stated that she is starting some new medications that have been helpful for lowering some of her anxiety.  Currently, she rated her anxiety they 5/10 as opposed to 9/10 a few weeks ago.  She continues to have some challenges, waking at night,  wandering around her bedroom touching walls, feels partially asleep/awake with some awareness.  Facilitated her also sharing other experiences where her anxiety increases.  She shared work Forensic psychologist.  She stated that her supervisor gave her an assignment today which caused her to have increased anxiety.  Assisted her in reframing identifying thoughts in session that related to her anxiety.  Patient plans to work to remind herself that she can utilize her supervisor for support and that she has competent to complete tasks at work.  Patient had difficulty identifying additional triggers and was encouraged to work on this between visits.  We discussed object narration with mindfulness coping skills.    Interventions: CBT, supportive therapy  Diagnoses:    ICD-10-CM   1. Major depressive disorder, recurrent episode, moderate (HCC)  F33.1          Plan: Patient is to use CBT, mindfulness and coping skills to help manage decrease symptoms associated with their diagnosis.   Long-term goals:   Maintain symptom reduction: The patient will report sustained reduction in symptoms of anxiety using both CBT and mindfulness interventions for 3 consecutive months progressively.  Improve emotional regulation: The patient will learn and apply CBT and mindfulness-based strategies to regulate emotions, such as mindfulness-based stress reduction and cognitive restructuring, and report an improvement in emotional regulation for at least 3 consecutive months progressively.  Short-term goal:  The patient will learn and apply CBT and mindfulness-based coping skills for managing anxiety and practice using it between sessions.       2.   Increase awareness of automatic thoughts: The patient will learn to identify automatic thoughts and increase awareness of their impact on emotions and behaviors through both CBT and mindfulness-based interventions.       3.    Identify, challenge, and replace  fearful/anxious/depressive self talk with positive, realistic, and empowering self talk that does not feed anxiety nor depression.      Waldron Session, [facility]

## 2021-10-25 ENCOUNTER — Ambulatory Visit: Payer: BC Managed Care – PPO | Admitting: Adult Health

## 2021-10-25 ENCOUNTER — Encounter: Payer: Self-pay | Admitting: Adult Health

## 2021-10-25 DIAGNOSIS — F331 Major depressive disorder, recurrent, moderate: Secondary | ICD-10-CM

## 2021-10-25 DIAGNOSIS — G2401 Drug induced subacute dyskinesia: Secondary | ICD-10-CM | POA: Diagnosis not present

## 2021-10-25 DIAGNOSIS — F410 Panic disorder [episodic paroxysmal anxiety] without agoraphobia: Secondary | ICD-10-CM | POA: Diagnosis not present

## 2021-10-25 DIAGNOSIS — F411 Generalized anxiety disorder: Secondary | ICD-10-CM | POA: Diagnosis not present

## 2021-10-25 DIAGNOSIS — G2571 Drug induced akathisia: Secondary | ICD-10-CM

## 2021-10-25 NOTE — Progress Notes (Signed)
Heather Lucas [id number] 1960-11-18 62 y.o.  Subjective:   Patient ID:  Heather Lucas is a 61 y.o. (DOB [birth year]) female.  Chief Complaint: No chief complaint on file.   HPI Heather Lucas presents to the office today for follow-up of MDD, GAD, panic attacks and TD.  Describes mood today as "ok". Pleasant. Denies tearfulness. Mood symptoms - reports some depression and irritability. Reports anxiety - "at times - a little bit". Reports some worry and rumination - "about the same". Denies recent panic attacks. Mood is consistent. Stating "I'm doing better than I was". Decreased TD and akathesia - "it's better". Stable interest and motivation. Taking medications as prescribed.  Energy levels fair. Active, does not have a regular exercise routine. Enjoys some usual interests and activities. Single. Lives with a roommate. Brother in [place]. Friends local. Attends church. Appetite adequate. Weight stable - 145.2 pounds. Sleep has improved. Averages 9 hours - reports 2 episodes of sleep walking at night. No previous history of sleep walking. Focus and concentration varies- some issues with remembering things. Sometimes responses are slower when tired. Completing tasks. Managing aspects of household. Works full times as an Conservator, museum/gallery. 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  Denies AH or VH. Denies self harm. Denies substance use.   Previous medication trials:  Zoloft, Prozac, Wellbutrin, Lexapro, Trintellix, Hydroxyzine.        Review of Systems:  Review of Systems  Musculoskeletal:  Negative for gait problem.  Neurological:  Negative for tremors.  Psychiatric/Behavioral:         Please refer to HPI    Medications: I have reviewed the patient's current medications.  Current Outpatient Medications  Medication Sig Dispense Refill   aspirin 81 MG EC tablet Take 81 mg by mouth daily.     benztropine (COGENTIN) 0.5 MG tablet Take one tablet at bedtime for one week, then increase to one tablet twice  daily. 60 tablet 2   clonazePAM (KLONOPIN) 1 MG tablet Take one tablet twice daily. 60 tablet 0   clopidogrel (PLAVIX) 75 MG tablet Take 75 mg by mouth daily.     ergocalciferol (VITAMIN D2) 1.25 MG (50000 UT) capsule Take 50,000 Units by mouth daily.     INGREZZA 40 MG capsule TAKE 1 CAPSULE BY MOUTH DAILY 30 capsule 0   midodrine (PROAMATINE) 5 MG tablet Take 5 mg  in the morning  and take 2.5 mg ( 1/2 tablet ) in the evening.     mirtazapine (REMERON) 15 MG tablet Take 1 tablet (15 mg total) by mouth at bedtime. 30 tablet 2   rosuvastatin (CRESTOR) 20 MG tablet Take 20 mg by mouth daily.  5   Sennosides 15 MG TABS Take 15 mg by mouth daily. Takes 8 tablets a night     No current facility-administered medications for this visit.    Medication Side Effects: None  Allergies: No Known Allergies  Past Medical History:  Diagnosis Date   Agatston coronary artery calcium score between 100 and 199 08/26/2017   Borderline hyperlipidemia    as of 10/2016: TC 134, TG 90, HDL 51, LDL 95 (PCP recently increase rosuvastatin dose from 10 to 20 mg)   Borderline hypertension    Coronary artery disease involving native coronary artery of native heart without angina pectoris 02/18/2020   Family history of premature coronary artery disease 08/26/2017   Hx of CABG x 4 06/17/2021   Hyperlipidemia due to dietary fat intake 08/26/2017   Ischemic cardiomyopathy 06/17/2021    Past Medical History, Surgical  history, Social history, and Family history were reviewed and updated as appropriate.   Please see review of systems for further details on the patient's review from today.   Objective:   Physical Exam:  There were no vitals taken for this visit.  Physical Exam Constitutional:      General: She is not in acute distress. Musculoskeletal:        General: No deformity.  Neurological:     Mental Status: She is alert and oriented to person, place, and time.     Coordination: Coordination normal.   Psychiatric:        Attention and Perception: Attention and perception normal. She does not perceive auditory or visual hallucinations.        Mood and Affect: Mood normal. Mood is not anxious or depressed. Affect is not labile, blunt, angry or inappropriate.        Speech: Speech normal.        Behavior: Behavior normal.        Thought Content: Thought content normal. Thought content is not paranoid or delusional. Thought content does not include homicidal or suicidal ideation. Thought content does not include homicidal or suicidal plan.        Cognition and Memory: Cognition and memory normal.        Judgment: Judgment normal.     Comments: Insight intact     Lab Review:     Component Value Date/Time   NA 141 10/16/2017 1412   K 4.6 10/16/2017 1412   CL 102 10/16/2017 1412   CO2 25 10/16/2017 1412   GLUCOSE 86 10/16/2017 1412   BUN 14 10/16/2017 1412   CREATININE 0.79 10/16/2017 1412   CALCIUM 9.0 10/16/2017 1412   GFRNONAA 83 10/16/2017 1412   GFRAA 95 10/16/2017 1412    No results found for: "WBC", "RBC", "HGB", "HCT", "PLT", "MCV", "MCH", "MCHC", "RDW", "LYMPHSABS", "MONOABS", "EOSABS", "BASOSABS"  No results found for: "POCLITH", "LITHIUM"   No results found for: "PHENYTOIN", "PHENOBARB", "VALPROATE", "CBMZ"   .res Assessment: Plan:    Plan:  PDMP reviewed  Clonazepam 1mg  TID - takes one at bedtime. Remeron 15mg  at hs Ingrezza 40mg  daily - Td less - but still visible - legs and mouth. Cogentin 0.5mg  BID for akathesia   Time spent with patient was 25 minutes. Greater than 50% of face to face time with patient was spent on counseling and coordination of care.    RTC 2 weeks.  Patient advised to contact office with any questions, adverse effects, or acute worsening in signs and symptoms.   Discussed potential benefits, risk, and side effects of benzodiazepines to include potential risk of tolerance and dependence, as well as possible drowsiness.  Advised  patient not to drive if experiencing drowsiness and to take lowest possible effective dose to minimize risk of dependence and tolerance.   Discussed potential metabolic side effects associated with atypical antipsychotics, as well as potential risk for movement side effects. Advised pt to contact office if movement side effects occur.    Diagnoses and all orders for this visit:  Major depressive disorder, recurrent episode, moderate (HCC)  Generalized anxiety disorder  Tardive dyskinesia  Panic attacks  Akathisia     Please see After Visit Summary for patient specific instructions.  Future Appointments  Date Time Provider Department Center  10/30/2021  3:00 PM , [facility] None  11/07/2021  3:40 PM Nou Chard, Waldron Session, NP [facility] None  11/21/2021  2:00 PM 11/09/2021, [facility] CP-CP  None  12/05/2021  3:00 PM Waldron Session, [facility] CP-CP None  12/20/2021  1:20 PM Marykay Lex, MD [facility] None    No orders of the defined types were placed in this encounter.   -------------------------------

## 2021-10-28 ENCOUNTER — Other Ambulatory Visit: Payer: Self-pay | Admitting: Adult Health

## 2021-10-28 DIAGNOSIS — G2401 Drug induced subacute dyskinesia: Secondary | ICD-10-CM

## 2021-10-30 ENCOUNTER — Ambulatory Visit (INDEPENDENT_AMBULATORY_CARE_PROVIDER_SITE_OTHER): Payer: BC Managed Care – PPO | Admitting: Mental Health

## 2021-10-30 DIAGNOSIS — F331 Major depressive disorder, recurrent, moderate: Secondary | ICD-10-CM

## 2021-10-30 NOTE — Progress Notes (Signed)
[facility]  Name: Heather Lucas Date: 10/30/2021 MRN: [id number] DOB: 29-May-1960 PCP: Cleatis Polka., MD  Time spent:  54 minutes  Treatment:  Ind. therapy  Mental Status Exam:    Appearance:    Casual     Behavior:   Appropriate  Motor:   WNL  Speech/Language:    Clear and Coherent  Affect:   Full range   Mood:   anxious  Thought process:   Logical, linear, goal directed  Thought content:     WNL  Sensory/Perceptual disturbances:     none  Orientation:   x4  Attention:   Good  Concentration:   Good  Memory:   Intact  Fund of knowledge:    Consistent with age and development  Insight:     Good  Judgment:    Good  Impulse Control:   Good     Reported Symptoms:  anxiety daily (10/10), depression (3/10)  Risk Assessment: Danger to Self:  No Self-injurious Behavior: No Danger to Others: No Duty to Warn:no Physical Aggression / Violence:No  Access to Firearms a concern: No  Gang Involvement:No  Patient / guardian was educated about steps to take if suicide or homicide risk level increases between visits: yes While future psychiatric events cannot be accurately predicted, the patient does not currently require acute inpatient psychiatric care and does not currently meet [facility] involuntary commitment criteria.  Medical History/Surgical History: Past Medical History:  Diagnosis Date   Agatston coronary artery calcium score between 100 and 199 08/26/2017   Borderline hyperlipidemia    as of 10/2016: TC 134, TG 90, HDL 51, LDL 95 (PCP recently increase rosuvastatin dose from 10 to 20 mg)   Borderline hypertension    Coronary artery disease involving native coronary artery of native heart without angina pectoris 02/18/2020   Family history of premature coronary artery disease 08/26/2017   Hx of CABG x 4 06/17/2021   Hyperlipidemia due to dietary fat intake 08/26/2017   Ischemic cardiomyopathy 06/17/2021    Past Surgical History:  Procedure  Laterality Date   CORONARY ARTERY BYPASS GRAFT  03/2020   [facility], [place], [place] -> reportedly CABG x4   LAPAROSCOPIC CHOLECYSTECTOMY  1995   LEFT HEART CATH AND CORONARY ANGIOGRAPHY  02/2020   [facility] [place], [place]) -> multivessel disease, referred for CABG   NM MYOVIEW LTD  02/2018   Clearly appears abnormal, referred for Apolinar Junes, [place]   TRANSTHORACIC ECHOCARDIOGRAM  02/2020   Several echoes in January and February 2022-initially noted to be abnormal along with Myoview stress test, immediately post CABG recheck    Medications: Current Outpatient Medications  Medication Sig Dispense Refill   aspirin 81 MG EC tablet Take 81 mg by mouth daily.     benztropine (COGENTIN) 0.5 MG tablet Take one tablet at bedtime for one week, then increase to one tablet twice daily. 60 tablet 2   clonazePAM (KLONOPIN) 1 MG tablet Take one tablet twice daily. 60 tablet 0   clopidogrel (PLAVIX) 75 MG tablet Take 75 mg by mouth daily.     ergocalciferol (VITAMIN D2) 1.25 MG (50000 UT) capsule Take 50,000 Units by mouth daily.     INGREZZA 40 MG capsule TAKE 1 CAPSULE BY MOUTH DAILY 30 capsule 0   midodrine (PROAMATINE) 5 MG tablet Take 5 mg  in the morning  and take 2.5 mg ( 1/2 tablet ) in the evening.     mirtazapine (REMERON) 15 MG tablet Take  1 tablet (15 mg total) by mouth at bedtime. 30 tablet 2   rosuvastatin (CRESTOR) 20 MG tablet Take 20 mg by mouth daily.  5   Sennosides 15 MG TABS Take 15 mg by mouth daily. Takes 8 tablets a night     No current facility-administered medications for this visit.     Subjective:  Patient presents on time for today's session.  She states she has missed work today, felt tired and slept until about 1 PM.  Explored her sleep regimen where she goes to bed on time in the evenings getting a minimum of 8 hours.  She stated this is consistent.  She stated she misses about 2 days/month and this has been chronic over many years.   She stated she is also started waking up later, hitting the snooze button and this in turn causing her to rush to get out the door to make it to work on time.  Provide some psychoeducation related to creating behavioral change, specifically habit building.  Explored with patient what she wants to be more consistent with where she identified getting up on time with her alarm as opposed to going back to sleep which has occurred sporadically for several years.  Explored with patient a specific routine to adhere to in the mornings, where she plans to follow through with getting up each morning when her alarm clock goes off, saying consistent with her morning routine to increase her frequency of attending work.    Interventions: CBT, supportive therapy  Diagnoses:    ICD-10-CM   1. Major depressive disorder, recurrent episode, moderate ([place])  F33.1           Plan: Patient is to use CBT, mindfulness and coping skills to help manage decrease symptoms associated with their diagnosis.   Long-term goals:   Maintain symptom reduction: The patient will report sustained reduction in symptoms of anxiety using both CBT and mindfulness interventions for 3 consecutive months progressively.  Improve emotional regulation: The patient will learn and apply CBT and mindfulness-based strategies to regulate emotions, such as mindfulness-based stress reduction and cognitive restructuring, and report an improvement in emotional regulation for at least 3 consecutive months progressively.  Short-term goal:  The patient will learn and apply CBT and mindfulness-based coping skills for managing anxiety and practice using it between sessions.       2.   Increase awareness of automatic thoughts: The patient will learn to identify automatic thoughts and increase awareness of their impact on emotions and behaviors through both CBT and mindfulness-based interventions.       3.    Identify, challenge, and replace  fearful/anxious/depressive self talk with positive, realistic, and empowering self talk that does not feed anxiety nor depression.      Anson Oregon, [facility]

## 2021-11-07 ENCOUNTER — Ambulatory Visit: Payer: BC Managed Care – PPO | Admitting: Adult Health

## 2021-11-07 ENCOUNTER — Encounter: Payer: Self-pay | Admitting: Adult Health

## 2021-11-07 DIAGNOSIS — F331 Major depressive disorder, recurrent, moderate: Secondary | ICD-10-CM | POA: Diagnosis not present

## 2021-11-07 DIAGNOSIS — G2401 Drug induced subacute dyskinesia: Secondary | ICD-10-CM | POA: Diagnosis not present

## 2021-11-07 DIAGNOSIS — F411 Generalized anxiety disorder: Secondary | ICD-10-CM

## 2021-11-07 DIAGNOSIS — F410 Panic disorder [episodic paroxysmal anxiety] without agoraphobia: Secondary | ICD-10-CM | POA: Diagnosis not present

## 2021-11-07 MED ORDER — BUPROPION HCL ER (XL) 150 MG PO TB24: 150.0000 mg | ORAL_TABLET | Freq: Every day | ORAL | 2 refills | Status: DC

## 2021-11-07 NOTE — Progress Notes (Signed)
Madhuri Vacca [id number] [birth year] 61 y.o.  Subjective:   Patient ID:  Heather Lucas is a 61 y.o. (DOB 13-Mar-1960) female.  Chief Complaint: No chief complaint on file.   HPI Heather Lucas presents to the office today for follow-up of MDD, GAD, panic attacks and TD.  Describes mood today as "ok". Pleasant. Denies tearfulness. Mood symptoms - decreased anxiety, depression and irritability. Reports some worry and rumination - "it's better". Denies recent panic attacks. Mood is consistent. Stating "I'm doing better". Decreased TD and akathesia. Stable interest and motivation. Taking medications as prescribed.  Energy levels decreased - "not a lot". Active, does not have a regular exercise routine. Enjoys some usual interests and activities. Single. Lives with a roommate. Brother in [place]. Friends local. Attends church. Appetite adequate. Weight stable - 145 pounds. Sleep has improved. Averages 9 hours - denies recent sleep walking. Focus and concentration difficulties - reporting some issues with remembering things. Sometimes responses are slower when tired. Completing tasks. Managing aspects of household. Works full times as an Conservator, museum/gallery. 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  Denies AH or VH. Denies self harm. Denies substance use.   Previous medication trials:  Zoloft, Prozac, Wellbutrin, Lexapro, Trintellix, Hydroxyzine.      Review of Systems:  Review of Systems  Musculoskeletal:  Negative for gait problem.  Neurological:  Negative for tremors.  Psychiatric/Behavioral:         Please refer to HPI    Medications: I have reviewed the patient's current medications.  Current Outpatient Medications  Medication Sig Dispense Refill   aspirin 81 MG EC tablet Take 81 mg by mouth daily.     benztropine (COGENTIN) 0.5 MG tablet Take one tablet at bedtime for one week, then increase to one tablet twice daily. 60 tablet 2   clonazePAM (KLONOPIN) 1 MG tablet Take one tablet twice daily. 60 tablet 0    clopidogrel (PLAVIX) 75 MG tablet Take 75 mg by mouth daily.     ergocalciferol (VITAMIN D2) 1.25 MG (50000 UT) capsule Take 50,000 Units by mouth daily.     INGREZZA 40 MG capsule TAKE 1 CAPSULE BY MOUTH DAILY 30 capsule 0   midodrine (PROAMATINE) 5 MG tablet Take 5 mg  in the morning  and take 2.5 mg ( 1/2 tablet ) in the evening.     mirtazapine (REMERON) 15 MG tablet Take 1 tablet (15 mg total) by mouth at bedtime. 30 tablet 2   rosuvastatin (CRESTOR) 20 MG tablet Take 20 mg by mouth daily.  5   Sennosides 15 MG TABS Take 15 mg by mouth daily. Takes 8 tablets a night     No current facility-administered medications for this visit.    Medication Side Effects: None  Allergies: No Known Allergies  Past Medical History:  Diagnosis Date   Agatston coronary artery calcium score between 100 and 199 08/26/2017   Borderline hyperlipidemia    as of 10/2016: TC 134, TG 90, HDL 51, LDL 95 (PCP recently increase rosuvastatin dose from 10 to 20 mg)   Borderline hypertension    Coronary artery disease involving native coronary artery of native heart without angina pectoris 02/18/2020   Family history of premature coronary artery disease 08/26/2017   Hx of CABG x 4 06/17/2021   Hyperlipidemia due to dietary fat intake 08/26/2017   Ischemic cardiomyopathy 06/17/2021    Past Medical History, Surgical history, Social history, and Family history were reviewed and updated as appropriate.   Please see review of systems for further details  on the patient's review from today.   Objective:   Physical Exam:  There were no vitals taken for this visit.  Physical Exam Constitutional:      General: She is not in acute distress. Musculoskeletal:        General: No deformity.  Neurological:     Mental Status: She is alert and oriented to person, place, and time.     Coordination: Coordination normal.  Psychiatric:        Attention and Perception: Attention and perception normal. She does not perceive  auditory or visual hallucinations.        Mood and Affect: Mood normal. Mood is not anxious or depressed. Affect is not labile, blunt, angry or inappropriate.        Speech: Speech normal.        Behavior: Behavior normal.        Thought Content: Thought content normal. Thought content is not paranoid or delusional. Thought content does not include homicidal or suicidal ideation. Thought content does not include homicidal or suicidal plan.        Cognition and Memory: Cognition and memory normal.        Judgment: Judgment normal.     Comments: Insight intact     Lab Review:     Component Value Date/Time   NA 141 10/16/2017 1412   K 4.6 10/16/2017 1412   CL 102 10/16/2017 1412   CO2 25 10/16/2017 1412   GLUCOSE 86 10/16/2017 1412   BUN 14 10/16/2017 1412   CREATININE 0.79 10/16/2017 1412   CALCIUM 9.0 10/16/2017 1412   GFRNONAA 83 10/16/2017 1412   GFRAA 95 10/16/2017 1412    No results found for: "WBC", "RBC", "HGB", "HCT", "PLT", "MCV", "MCH", "MCHC", "RDW", "LYMPHSABS", "MONOABS", "EOSABS", "BASOSABS"  No results found for: "POCLITH", "LITHIUM"   No results found for: "PHENYTOIN", "PHENOBARB", "VALPROATE", "CBMZ"   .res Assessment: Plan:     Plan:  PDMP reviewed  Clonazepam 1mg  daily - takes at bedtime. Remeron 15mg  at hs Ingrezza 40mg  daily - Td less - but still visible - legs and mouth. Cogentin 0.5mg  BID for akathesia   Add Wellbutrin XL 150mg  every morning  Time spent with patient was 25 minutes. Greater than 50% of face to face time with patient was spent on counseling and coordination of care.    RTC 2 weeks.  Patient advised to contact office with any questions, adverse effects, or acute worsening in signs and symptoms.   Discussed potential benefits, risk, and side effects of benzodiazepines to include potential risk of tolerance and dependence, as well as possible drowsiness.  Advised patient not to drive if experiencing drowsiness and to take lowest  possible effective dose to minimize risk of dependence and tolerance.   Discussed potential metabolic side effects associated with atypical antipsychotics, as well as potential risk for movement side effects. Advised pt to contact office if movement side effects occur.    There are no diagnoses linked to this encounter.   Please see After Visit Summary for patient specific instructions.  Future Appointments  Date Time Provider [place]  11/21/2021  2:00 PM Anson Oregon, [facility] None  12/05/2021  3:00 PM Anson [place], [facility] None  12/19/2021  4:00 PM Anson Oregon, [facility] CP-CP None  12/20/2021  1:20 PM Leonie Man, MD [facility] None    No orders of the defined types were placed in this encounter.   -------------------------------

## 2021-11-21 ENCOUNTER — Ambulatory Visit: Payer: BC Managed Care – PPO | Admitting: Adult Health

## 2021-11-21 ENCOUNTER — Ambulatory Visit (INDEPENDENT_AMBULATORY_CARE_PROVIDER_SITE_OTHER): Payer: BC Managed Care – PPO | Admitting: Mental Health

## 2021-11-21 ENCOUNTER — Encounter: Payer: Self-pay | Admitting: Adult Health

## 2021-11-21 DIAGNOSIS — F411 Generalized anxiety disorder: Secondary | ICD-10-CM | POA: Diagnosis not present

## 2021-11-21 DIAGNOSIS — F331 Major depressive disorder, recurrent, moderate: Secondary | ICD-10-CM | POA: Diagnosis not present

## 2021-11-21 DIAGNOSIS — G2401 Drug induced subacute dyskinesia: Secondary | ICD-10-CM

## 2021-11-21 DIAGNOSIS — G2571 Drug induced akathisia: Secondary | ICD-10-CM

## 2021-11-21 DIAGNOSIS — F410 Panic disorder [episodic paroxysmal anxiety] without agoraphobia: Secondary | ICD-10-CM

## 2021-11-21 NOTE — Progress Notes (Signed)
[facility]  Name: Heather Lucas Date: 11/21/2021 MRN: [id number] DOB: [birth year] PCP: Ginger Organ., MD  Time spent:  54 minutes  Treatment:  Ind. therapy  Mental Status Exam:    Appearance:    Casual     Behavior:   Appropriate  Motor:   WNL  Speech/Language:    Clear and Coherent  Affect:   Full range   Mood:   anxious  Thought process:   Logical, linear, goal directed  Thought content:     WNL  Sensory/Perceptual disturbances:     none  Orientation:   x4  Attention:   Good  Concentration:   Good  Memory:   Intact  Fund of knowledge:    Consistent with age and development  Insight:     Good  Judgment:    Good  Impulse Control:   Good     Reported Symptoms:  anxiety daily (10/10), depression (3/10)  Risk Assessment: Danger to Self:  No Self-injurious Behavior: No Danger to Others: No Duty to Warn:no Physical Aggression / Violence:No  Access to Firearms a concern: No  Gang Involvement:No  Patient / guardian was educated about steps to take if suicide or homicide risk level increases between visits: yes While future psychiatric events cannot be accurately predicted, the patient does not currently require acute inpatient psychiatric care and does not currently meet [facility] involuntary commitment criteria.  Medical History/Surgical History: Past Medical History:  Diagnosis Date   Agatston coronary artery calcium score between 100 and 199 08/26/2017   Borderline hyperlipidemia    as of 10/2016: TC 134, TG 90, HDL 51, LDL 95 (PCP recently increase rosuvastatin dose from 10 to 20 mg)   Borderline hypertension    Coronary artery disease involving native coronary artery of native heart without angina pectoris 02/18/2020   Family history of premature coronary artery disease 08/26/2017   Hx of CABG x 4 06/17/2021   Hyperlipidemia due to dietary fat intake 08/26/2017   Ischemic cardiomyopathy 06/17/2021    Past Surgical History:  Procedure  Laterality Date   CORONARY ARTERY BYPASS GRAFT  03/2020   [facility], [place], [place] -> reportedly CABG x4   [place]   LEFT HEART CATH AND CORONARY ANGIOGRAPHY  02/2020   [facility] [place], [place]) -> multivessel disease, referred for CABG   NM MYOVIEW LTD  02/2018   Clearly appears abnormal, referred for Erlene Quan, [place] ECHOCARDIOGRAM  02/2020   Several echoes in January and February 2022-initially noted to be abnormal along with Myoview stress test, immediately post CABG recheck    Medications: Current Outpatient Medications  Medication Sig Dispense Refill   aspirin 81 MG EC tablet Take 81 mg by mouth daily.     benztropine (COGENTIN) 0.5 MG tablet Take one tablet at bedtime for one week, then increase to one tablet twice daily. 60 tablet 2   buPROPion (WELLBUTRIN XL) 150 MG 24 hr tablet Take 1 tablet (150 mg total) by mouth daily. 30 tablet 2   clonazePAM (KLONOPIN) 1 MG tablet Take one tablet twice daily. 60 tablet 0   clopidogrel (PLAVIX) 75 MG tablet Take 75 mg by mouth daily.     ergocalciferol (VITAMIN D2) 1.25 MG (50000 UT) capsule Take 50,000 Units by mouth daily.     INGREZZA 40 MG capsule TAKE 1 CAPSULE BY MOUTH DAILY 30 capsule 0   midodrine (PROAMATINE) 5 MG tablet Take 5 mg  in the morning  and take 2.5 mg ( 1/2 tablet ) in the evening.     mirtazapine (REMERON) 15 MG tablet Take 1 tablet (15 mg total) by mouth at bedtime. 30 tablet 2   rosuvastatin (CRESTOR) 20 MG tablet Take 20 mg by mouth daily.  5   Sennosides 15 MG TABS Take 15 mg by mouth daily. Takes 8 tablets a night     No current facility-administered medications for this visit.     Subjective:  Patient presents on time for today's session.   She reports she having short term memory deficits.  She stated its affecting her job. She stated it has occurred over the last 2 weeks; she tends to forget sequencing of tasks, after a few  minutes she then can recall. Has been walking in her sleep for the past month; sometimes she will move furniture in her bedroom.  She also tried to go to work 2x over the last month on a Saturday. A friend spent the night recently and noticed she got up 2x at night. Hiding keys in the car so she will not try and drive. Getting about 8 hours/night on average. She is improving with getting up in the morning, a friend has been calling to wake her up.  We continue to explore collaboratively thoughts, experiences associated with her anxiety which she rates about a 5/10 in severity over the past few weeks, reports it being decreased.  She identified thoughts related to worry about her keeping her job due to her challenges with her memory.  She stated she has made progress with attendance at work and has not missed over the past 3 weeks.  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  Encouraged her to further discuss memory impairment concerns later today at her medication management appointment with Deloria Lair, NP.  Interventions: CBT, supportive therapy, motivational interviewing  Diagnoses:    ICD-10-CM   1. Generalized anxiety disorder  F41.1        Plan: Patient is to use CBT, mindfulness and coping skills to help manage decrease symptoms associated with their diagnosis.   Long-term goals:   Maintain symptom reduction: The patient will report sustained reduction in symptoms of anxiety using both CBT and mindfulness interventions for 3 consecutive months progressively.  Improve emotional regulation: The patient will learn and apply CBT and mindfulness-based strategies to regulate emotions, such as mindfulness-based stress reduction and cognitive restructuring, and report an improvement in emotional regulation for at least 3 consecutive months progressively.  Short-term goal:  The patient will learn and apply CBT and  mindfulness-based coping skills for managing anxiety and practice using it between sessions.       2.   Increase awareness of automatic thoughts: The patient will learn to identify automatic thoughts and increase awareness of their impact on emotions and behaviors through both CBT and mindfulness-based interventions.       3.    Identify, challenge, and replace fearful/anxious/depressive self talk with positive, realistic, and empowering self talk that does not feed anxiety nor depression.      Anson [place], [facility]

## 2021-11-21 NOTE — Progress Notes (Signed)
Heather Lucas [id number] [birth year] 61 y.o.  Subjective:   Patient ID:  Heather Lucas is a 61 y.o. (DOB 17-Jun-1960) female.  Chief Complaint: No chief complaint on file.   HPI Heather Lucas presents to the office today for follow-up of MDD, GAD, panic attacks and TD.  Describes mood today as "ok". Pleasant. Denies tearfulness. Mood symptoms - decreased anxiety - "a little, not like I was". Denies depression and irritability. Reports some worry and rumination. Mood is variable. Concerned about memory issues - more so in work setting - forgetting things, but then it comes to her. Concerned about losing job. Stating "I'm still doing better than I was". Decreased TD and akathisia - but still present. Stable interest and motivation. Taking medications as prescribed.  Energy levels lower - "not a lot". Active, does not have a regular exercise routine Enjoys some usual interests and activities. Single. Lives with a roommate. Brother in [place]. Friends local. Attends church. Appetite adequate. Weight stable - 145 pounds. Sleep has improved - having a difficult time getting going in the mornings. Averages 9 hours - reports sleep walking - a few times per roommate. Focus and concentration difficulties - "getting worse". Having issues in the work setting. Completing tasks. Managing aspects of household. Works full times as an [employer] fast [employer]. 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  Denies AH or VH. Denies self harm. Denies substance use.   Previous medication trials:  Zoloft, Prozac, Wellbutrin, Lexapro, Trintellix, Hydroxyzine.      Review of Systems:  Review of Systems  Musculoskeletal:  Negative for gait problem.  Neurological:  Negative for tremors.  Psychiatric/Behavioral:         Please refer to HPI    Medications: I have reviewed the patient's current medications.  Current Outpatient Medications  Medication Sig Dispense Refill   aspirin 81 MG EC tablet Take 81 mg by mouth daily.     benztropine  (COGENTIN) 0.5 MG tablet Take one tablet at bedtime for one week, then increase to one tablet twice daily. 60 tablet 2   buPROPion (WELLBUTRIN XL) 150 MG 24 hr tablet Take 1 tablet (150 mg total) by mouth daily. 30 tablet 2   clonazePAM (KLONOPIN) 1 MG tablet Take one tablet twice daily. 60 tablet 0   clopidogrel (PLAVIX) 75 MG tablet Take 75 mg by mouth daily.     ergocalciferol (VITAMIN D2) 1.25 MG (50000 UT) capsule Take 50,000 Units by mouth daily.     INGREZZA 40 MG capsule TAKE 1 CAPSULE BY MOUTH DAILY 30 capsule 0   midodrine (PROAMATINE) 5 MG tablet Take 5 mg  in the morning  and take 2.5 mg ( 1/2 tablet ) in the evening.     mirtazapine (REMERON) 15 MG tablet Take 1 tablet (15 mg total) by mouth at bedtime. 30 tablet 2   rosuvastatin (CRESTOR) 20 MG tablet Take 20 mg by mouth daily.  5   Sennosides 15 MG TABS Take 15 mg by mouth daily. Takes 8 tablets a night     No current facility-administered medications for this visit.    Medication Side Effects: None  Allergies: No Known Allergies  Past Medical History:  Diagnosis Date   Agatston coronary artery calcium score between 100 and 199 08/26/2017   Borderline hyperlipidemia    as of 10/2016: TC 134, TG 90, HDL 51, LDL 95 (PCP recently increase rosuvastatin dose from 10 to 20 mg)   Borderline hypertension    Coronary artery disease involving native coronary artery of native heart  without angina pectoris 02/18/2020   Family history of premature coronary artery disease 08/26/2017   Hx of CABG x 4 06/17/2021   Hyperlipidemia due to dietary fat intake 08/26/2017   Ischemic cardiomyopathy 06/17/2021    Past Medical History, Surgical history, Social history, and Family history were reviewed and updated as appropriate.   Please see review of systems for further details on the patient's review from today.   Objective:   Physical Exam:  There were no vitals taken for this visit.  Physical Exam Constitutional:      General: She is not in  acute distress. Musculoskeletal:        General: No deformity.  Neurological:     Mental Status: She is alert and oriented to person, place, and time.     Coordination: Coordination normal.  Psychiatric:        Attention and Perception: Attention and perception normal. She does not perceive auditory or visual hallucinations.        Mood and Affect: Mood normal. Mood is not anxious or depressed. Affect is not labile, blunt, angry or inappropriate.        Speech: Speech normal.        Behavior: Behavior normal.        Thought Content: Thought content normal. Thought content is not paranoid or delusional. Thought content does not include homicidal or suicidal ideation. Thought content does not include homicidal or suicidal plan.        Cognition and Memory: Cognition and memory normal.        Judgment: Judgment normal.     Comments: Insight intact     Lab Review:     Component Value Date/Time   NA 141 10/16/2017 1412   K 4.6 10/16/2017 1412   CL 102 10/16/2017 1412   CO2 25 10/16/2017 1412   GLUCOSE 86 10/16/2017 1412   BUN 14 10/16/2017 1412   CREATININE 0.79 10/16/2017 1412   CALCIUM 9.0 10/16/2017 1412   GFRNONAA 83 10/16/2017 1412   GFRAA 95 10/16/2017 1412    No results found for: "WBC", "RBC", "HGB", "HCT", "PLT", "MCV", "MCH", "MCHC", "RDW", "LYMPHSABS", "MONOABS", "EOSABS", "BASOSABS"  No results found for: "POCLITH", "LITHIUM"   No results found for: "PHENYTOIN", "PHENOBARB", "VALPROATE", "CBMZ"   .res Assessment: Plan:    Plan:  PDMP reviewed  Clonazepam 1mg  daily - takes at bedtime. Remeron 15mg  at hs Ingrezza 40mg  daily - TD less - but still visible - legs and mouth. Cogentin 0.5mg  BID for akathesia - decrease to 1.5 tabs at hs x 7, then decrease to one tablet at hs Wellbutrin XL 150mg  every morning  Time spent with patient was 25 minutes. Greater than 50% of face to face time with patient was spent on counseling and coordination of care.    RTC 2  weeks.  Patient advised to contact office with any questions, adverse effects, or acute worsening in signs and symptoms.   Discussed potential benefits, risk, and side effects of benzodiazepines to include potential risk of tolerance and dependence, as well as possible drowsiness.  Advised patient not to drive if experiencing drowsiness and to take lowest possible effective dose to minimize risk of dependence and tolerance.   Discussed potential metabolic side effects associated with atypical antipsychotics, as well as potential risk for movement side effects. Advised pt to contact office if movement side effects occur.   There are no diagnoses linked to this encounter.   Please see After Visit Summary for patient specific  instructions.  Future Appointments  Date Time Provider [place]  11/21/2021  3:20 PM Niemah Schwebke, Berdie Ogren, NP [facility] None  12/05/2021  3:00 PM Anson [place], [facility] None  12/19/2021  4:00 PM Anson [place], [facility] None  12/20/2021  1:20 PM Leonie Man, MD [facility] None    No orders of the defined types were placed in this encounter.   -------------------------------

## 2021-11-27 ENCOUNTER — Other Ambulatory Visit: Payer: Self-pay | Admitting: Adult Health

## 2021-11-27 DIAGNOSIS — F411 Generalized anxiety disorder: Secondary | ICD-10-CM

## 2021-11-29 ENCOUNTER — Other Ambulatory Visit: Payer: Self-pay | Admitting: Adult Health

## 2021-11-29 DIAGNOSIS — G2401 Drug induced subacute dyskinesia: Secondary | ICD-10-CM

## 2021-12-05 ENCOUNTER — Ambulatory Visit: Payer: BC Managed Care – PPO | Admitting: Mental Health

## 2021-12-05 ENCOUNTER — Ambulatory Visit: Payer: BC Managed Care – PPO | Admitting: Adult Health

## 2021-12-05 DIAGNOSIS — F331 Major depressive disorder, recurrent, moderate: Secondary | ICD-10-CM | POA: Diagnosis not present

## 2021-12-05 NOTE — Progress Notes (Signed)
[facility] Note  Name: Heather Lucas Date: 12/05/2021 MRN: [id number] DOB: January 08, 1961 PCP: Ginger Organ., MD  Time spent:  55 minutes  Treatment:  Ind. therapy  Mental Status Exam:    Appearance:    Casual     Behavior:   Appropriate  Motor:   WNL  Speech/Language:    Clear and Coherent  Affect:   Full range   Mood:   anxious  Thought process:   Logical, linear, goal directed  Thought content:     WNL  Sensory/Perceptual disturbances:     none  Orientation:   x4  Attention:   Good  Concentration:   Good  Memory:   Intact  Fund of knowledge:    Consistent with age and development  Insight:     Good  Judgment:    Good  Impulse Control:   Good     Reported Symptoms:  anxiety daily (10/10), depression (3/10)  Risk Assessment: Danger to Self:  No Self-injurious Behavior: No Danger to Others: No Duty to Warn:no Physical Aggression / Violence:No  Access to Firearms a concern: No  Gang Involvement:No  Patient / guardian was educated about steps to take if suicide or homicide risk level increases between visits: yes While future psychiatric events cannot be accurately predicted, the patient does not currently require acute inpatient psychiatric care and does not currently meet [facility] involuntary commitment criteria.  Medical History/Surgical History: Past Medical History:  Diagnosis Date   Agatston coronary artery calcium score between 100 and 199 08/26/2017   Borderline hyperlipidemia    as of 10/2016: TC 134, TG 90, HDL 51, LDL 95 (PCP recently increase rosuvastatin dose from 10 to 20 mg)   Borderline hypertension    Coronary artery disease involving native coronary artery of native heart without angina pectoris 02/18/2020   Family history of premature coronary artery disease 08/26/2017   Hx of CABG x 4 06/17/2021   Hyperlipidemia due to dietary fat intake 08/26/2017   Ischemic cardiomyopathy 06/17/2021    Past Surgical History:  Procedure  Laterality Date   CORONARY ARTERY BYPASS GRAFT  03/2020   [facility], [place], [place] -> reportedly CABG x4   [place]   LEFT HEART CATH AND CORONARY ANGIOGRAPHY  02/2020   [facility] [place], [place]) -> multivessel disease, referred for CABG   NM MYOVIEW LTD  02/2018   Clearly appears abnormal, referred for Erlene Quan, [place] ECHOCARDIOGRAM  02/2020   Several echoes in January and February 2022-initially noted to be abnormal along with Myoview stress test, immediately post CABG recheck    Medications: Current Outpatient Medications  Medication Sig Dispense Refill   aspirin 81 MG EC tablet Take 81 mg by mouth daily.     benztropine (COGENTIN) 0.5 MG tablet Take one tablet at bedtime for one week, then increase to one tablet twice daily. 60 tablet 2   buPROPion (WELLBUTRIN XL) 150 MG 24 hr tablet Take 1 tablet (150 mg total) by mouth daily. 30 tablet 2   clonazePAM (KLONOPIN) 1 MG tablet TAKE ONE TABLET BY MOUTH TWICE DAILY 60 tablet 0   clopidogrel (PLAVIX) 75 MG tablet Take 75 mg by mouth daily.     ergocalciferol (VITAMIN D2) 1.25 MG (50000 UT) capsule Take 50,000 Units by mouth daily.     midodrine (PROAMATINE) 5 MG tablet Take 5 mg  in the morning  and take 2.5 mg ( 1/2 tablet ) in the evening.  mirtazapine (REMERON) 15 MG tablet Take 1 tablet (15 mg total) by mouth at bedtime. 30 tablet 2   rosuvastatin (CRESTOR) 20 MG tablet Take 20 mg by mouth daily.  5   Sennosides 15 MG TABS Take 15 mg by mouth daily. Takes 8 tablets a night     valbenazine (INGREZZA) 40 MG capsule TAKE 1 CAPSULE BY MOUTH DAILY 30 capsule 1   No current facility-administered medications for this visit.     Subjective:  Patient presents on time for today's session.  Assessed progress. She expressed worry about her job, "I'm too slow" referring to her speed of getting through tasks at work.  Through further exploration, she denied that  she has had any discussions with her supervisor related to corrective action, no negative feedback.  Patient was encouraged to be mindful of thoughts associated with her tendency to assume she is deficient at work.  She was able to identify self supportive cognitions such as "I am in a shop to work 1 time, get my work done and remember that still learning". She reports some financial stress, her roommate has to move out therefore she has a family apartment that is within her budget. She stated that she continues struggle with thought processing, states she gets confused finding the words. Stated she has been getting more confused about what day it is.  She plans to discuss further at her next med management appointment.  Reports doing adequate rest, less anxiety  Interventions: CBT, supportive therapy, motivational interviewing  Diagnoses:    ICD-10-CM   1. Major depressive disorder, recurrent episode, moderate (HCC)  F33.1         Plan: Patient is to use CBT, mindfulness and coping skills to help manage decrease symptoms associated with their diagnosis.   Long-term goals:   Maintain symptom reduction: The patient will report sustained reduction in symptoms of anxiety using both CBT and mindfulness interventions for 3 consecutive months progressively.  Improve emotional regulation: The patient will learn and apply CBT and mindfulness-based strategies to regulate emotions, such as mindfulness-based stress reduction and cognitive restructuring, and report an improvement in emotional regulation for at least 3 consecutive months progressively.  Short-term goal:  The patient will learn and apply CBT and mindfulness-based coping skills for managing anxiety and practice using it between sessions.       2.   Increase awareness of automatic thoughts: The patient will learn to identify automatic thoughts and increase awareness of their impact on emotions and behaviors through both CBT and  mindfulness-based interventions.       3.    Identify, challenge, and replace fearful/anxious/depressive self talk with positive, realistic, and empowering self talk that does not feed anxiety nor depression.      Waldron Session, [facility]

## 2021-12-13 ENCOUNTER — Ambulatory Visit: Payer: BC Managed Care – PPO | Admitting: Adult Health

## 2021-12-13 ENCOUNTER — Encounter: Payer: Self-pay | Admitting: Adult Health

## 2021-12-13 DIAGNOSIS — F410 Panic disorder [episodic paroxysmal anxiety] without agoraphobia: Secondary | ICD-10-CM

## 2021-12-13 DIAGNOSIS — F411 Generalized anxiety disorder: Secondary | ICD-10-CM

## 2021-12-13 DIAGNOSIS — G2571 Drug induced akathisia: Secondary | ICD-10-CM | POA: Diagnosis not present

## 2021-12-13 DIAGNOSIS — G2401 Drug induced subacute dyskinesia: Secondary | ICD-10-CM | POA: Diagnosis not present

## 2021-12-13 DIAGNOSIS — F331 Major depressive disorder, recurrent, moderate: Secondary | ICD-10-CM | POA: Diagnosis not present

## 2021-12-13 MED ORDER — CLONAZEPAM 1 MG PO TABS: ORAL_TABLET | ORAL | 0 refills | Status: DC

## 2021-12-13 NOTE — Progress Notes (Signed)
Heather Lucas [id number] 07-05-1960 61 y.o.  Subjective:   Patient ID:  Heather Lucas is a 61 y.o. (DOB 01/15/1961) female.  Chief Complaint: No chief complaint on file.   HPI Heather Lucas presents to the office today for follow-up of MDD, GAD, panic attacks and TD.  Describes mood today as "ok". Pleasant. Denies tearfulness. Mood symptoms - decreased anxiety - "sometimes, not as bad as it was".  Denies depression and irritability. Reports some worry and rumination. Mood is more consistent. Memory issues are "a little better". Stating "I think I'm doing better". Decreased TD and akathisia - but still present. Stable interest and motivation. Taking medications as prescribed.  Energy levels "alright". Active, does not have a regular exercise routine Enjoys some usual interests and activities. Single. Lives with a roommate. Brother in [place]. Friends local. Attends church. Appetite adequate. Weight stable - 145 pounds. Sleeps well most nights. Averages 9 hours. Focus and concentration "a little better". Completing tasks. Managing aspects of household. Doing better in the work setting. Works full time   an [employer]. 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  Denies AH or VH. Denies self harm. Denies substance use.   Previous medication trials:  Zoloft, Prozac, Wellbutrin, Lexapro, Trintellix, Hydroxyzine.      Review of Systems:  Review of Systems  Musculoskeletal:  Negative for gait problem.  Neurological:  Negative for tremors.  Psychiatric/Behavioral:         Please refer to HPI    Medications: I have reviewed the patient's current medications.  Current Outpatient Medications  Medication Sig Dispense Refill   aspirin 81 MG EC tablet Take 81 mg by mouth daily.     benztropine (COGENTIN) 0.5 MG tablet Take one tablet at bedtime for one week, then increase to one tablet twice daily. 60 tablet 2   buPROPion (WELLBUTRIN XL) 150 MG 24 hr tablet Take 1 tablet (150 mg total) by mouth daily. 30  tablet 2   clonazePAM (KLONOPIN) 1 MG tablet TAKE ONE TABLET BY MOUTH TWICE DAILY 60 tablet 0   clopidogrel (PLAVIX) 75 MG tablet Take 75 mg by mouth daily.     ergocalciferol (VITAMIN D2) 1.25 MG (50000 UT) capsule Take 50,000 Units by mouth daily.     midodrine (PROAMATINE) 5 MG tablet Take 5 mg  in the morning  and take 2.5 mg ( 1/2 tablet ) in the evening.     mirtazapine (REMERON) 15 MG tablet Take 1 tablet (15 mg total) by mouth at bedtime. 30 tablet 2   rosuvastatin (CRESTOR) 20 MG tablet Take 20 mg by mouth daily.  5   Sennosides 15 MG TABS Take 15 mg by mouth daily. Takes 8 tablets a night     valbenazine (INGREZZA) 40 MG capsule TAKE 1 CAPSULE BY MOUTH DAILY 30 capsule 1   No current facility-administered medications for this visit.    Medication Side Effects: None  Allergies: No Known Allergies  Past Medical History:  Diagnosis Date   Agatston coronary artery calcium score between 100 and 199 08/26/2017   Borderline hyperlipidemia    as of 10/2016: TC 134, TG 90, HDL 51, LDL 95 (PCP recently increase rosuvastatin dose from 10 to 20 mg)   Borderline hypertension    Coronary artery disease involving native coronary artery of native heart without angina pectoris 02/18/2020   Family history of premature coronary artery disease 08/26/2017   Hx of CABG x 4 06/17/2021   Hyperlipidemia due to dietary fat intake 08/26/2017   Ischemic cardiomyopathy 06/17/2021  Past Medical History, Surgical history, Social history, and Family history were reviewed and updated as appropriate.   Please see review of systems for further details on the patient's review from today.   Objective:   Physical Exam:  There were no vitals taken for this visit.  Physical Exam Constitutional:      General: She is not in acute distress. Musculoskeletal:        General: No deformity.  Neurological:     Mental Status: She is alert and oriented to person, place, and time.     Coordination: Coordination normal.   Psychiatric:        Attention and Perception: Attention and perception normal. She does not perceive auditory or visual hallucinations.        Mood and Affect: Mood normal. Mood is not anxious or depressed. Affect is not labile, blunt, angry or inappropriate.        Speech: Speech normal.        Behavior: Behavior normal.        Thought Content: Thought content normal. Thought content is not paranoid or delusional. Thought content does not include homicidal or suicidal ideation. Thought content does not include homicidal or suicidal plan.        Cognition and Memory: Cognition and memory normal.        Judgment: Judgment normal.     Comments: Insight intact     Lab Review:     Component Value Date/Time   NA 141 10/16/2017 1412   K 4.6 10/16/2017 1412   CL 102 10/16/2017 1412   CO2 25 10/16/2017 1412   GLUCOSE 86 10/16/2017 1412   BUN 14 10/16/2017 1412   CREATININE 0.79 10/16/2017 1412   CALCIUM 9.0 10/16/2017 1412   GFRNONAA 83 10/16/2017 1412   GFRAA 95 10/16/2017 1412    No results found for: "WBC", "RBC", "HGB", "HCT", "PLT", "MCV", "MCH", "MCHC", "RDW", "LYMPHSABS", "MONOABS", "EOSABS", "BASOSABS"  No results found for: "POCLITH", "LITHIUM"   No results found for: "PHENYTOIN", "PHENOBARB", "VALPROATE", "CBMZ"   .res Assessment: Plan:   Plan:  PDMP reviewed  Clonazepam 1mg  daily - takes at bedtime. Remeron 15mg  at hs Ingrezza 40mg  daily - TD less - but still visible - legs and mouth. Cogentin 0.5mg  daily for akathesia - decrease to 1/2 tablet daily  x 7, then d/c. Wellbutrin XL 150mg  every morning  Time spent with patient was 25 minutes. Greater than 50% of face to face time with patient was spent on counseling and coordination of care.    RTC 2 weeks.  Patient advised to contact office with any questions, adverse effects, or acute worsening in signs and symptoms.   Discussed potential benefits, risk, and side effects of benzodiazepines to include potential  risk of tolerance and dependence, as well as possible drowsiness.  Advised patient not to drive if experiencing drowsiness and to take lowest possible effective dose to minimize risk of dependence and tolerance.   Discussed potential metabolic side effects associated with atypical antipsychotics, as well as potential risk for movement side effects. Advised pt to contact office if movement side effects occur.   Diagnoses and all orders for this visit:  Major depressive disorder, recurrent episode, moderate (HCC)  Generalized anxiety disorder -     clonazePAM (KLONOPIN) 1 MG tablet; TAKE ONE TABLET BY MOUTH TWICE DAILY  Tardive dyskinesia  Akathisia  Panic attacks     Please see After Visit Summary for patient specific instructions.  Future Appointments  Date Time  Provider Department Center  12/19/2021  4:00 PM Waldron Session, [facility] None  12/20/2021  1:20 PM Marykay Lex, MD [facility] None    No orders of the defined types were placed in this encounter.   -------------------------------

## 2021-12-19 ENCOUNTER — Ambulatory Visit: Payer: BC Managed Care – PPO | Admitting: Mental Health

## 2021-12-20 ENCOUNTER — Ambulatory Visit: Payer: BC Managed Care – PPO | Admitting: Cardiology

## 2021-12-27 ENCOUNTER — Ambulatory Visit: Payer: BC Managed Care – PPO | Admitting: Adult Health

## 2021-12-27 ENCOUNTER — Encounter: Payer: Self-pay | Admitting: Adult Health

## 2021-12-27 DIAGNOSIS — F411 Generalized anxiety disorder: Secondary | ICD-10-CM | POA: Diagnosis not present

## 2021-12-27 DIAGNOSIS — F410 Panic disorder [episodic paroxysmal anxiety] without agoraphobia: Secondary | ICD-10-CM | POA: Diagnosis not present

## 2021-12-27 DIAGNOSIS — G2401 Drug induced subacute dyskinesia: Secondary | ICD-10-CM

## 2021-12-27 DIAGNOSIS — G2571 Drug induced akathisia: Secondary | ICD-10-CM

## 2021-12-27 DIAGNOSIS — F331 Major depressive disorder, recurrent, moderate: Secondary | ICD-10-CM

## 2021-12-27 NOTE — Progress Notes (Signed)
[name] [id number] 04-16-1960 61 y.o.  Subjective:   Patient ID:  Heather Lucas is a 61 y.o. (DOB February 23, 1960) female.  Chief Complaint: No chief complaint on file.   HPI Heather Lucas presents to the office today for follow-up of MDD, GAD, panic attacks and TD.  Describes mood today as "ok". Pleasant. Denies tearfulness. Mood symptoms - decreased anxiety - "sometimes". Denies depression and irritability. Reports some worry and rumination. Mood is more consistent. Stating "I feel better".   Stable interest and motivation. Taking medications as prescribed.  Energy levels "average". Active, does not have a regular exercise routine Enjoys some usual interests and activities. Single. Lives with a roommate. Brother in [place]. Friends local. Attends church. Appetite adequate. Weight stable - 145 pounds. Sleeps well most nights. Averages 9 hours. Focus and concentration improved - "thinking more clearly". Completing tasks. Managing aspects of household. Doing better in the work setting. 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  Denies AH or VH. Denies self harm. Denies substance use.   Previous medication trials:  Zoloft, Prozac, Wellbutrin, Lexapro, Trintellix, Hydroxyzine.   AIMS    Flowsheet Row Office Visit from 12/13/2021 in [facility]  AIMS Total Score 28        Review of Systems:  Review of Systems  Musculoskeletal:  Negative for gait problem.  Neurological:  Negative for tremors.  Psychiatric/Behavioral:         Please refer to HPI    Medications: I have reviewed the patient's current medications.  Current Outpatient Medications  Medication Sig Dispense Refill   aspirin 81 MG EC tablet Take 81 mg by mouth daily.     benztropine (COGENTIN) 0.5 MG tablet Take one tablet at bedtime for one week, then increase to one tablet twice daily. 60 tablet 2   buPROPion (WELLBUTRIN XL) 150 MG 24 hr tablet Take 1 tablet (150 mg total) by mouth  daily. 30 tablet 2   clonazePAM (KLONOPIN) 1 MG tablet TAKE ONE TABLET BY MOUTH TWICE DAILY 60 tablet 0   clopidogrel (PLAVIX) 75 MG tablet Take 75 mg by mouth daily.     ergocalciferol (VITAMIN D2) 1.25 MG (50000 UT) capsule Take 50,000 Units by mouth daily.     midodrine (PROAMATINE) 5 MG tablet Take 5 mg  in the morning  and take 2.5 mg ( 1/2 tablet ) in the evening.     mirtazapine (REMERON) 15 MG tablet Take 1 tablet (15 mg total) by mouth at bedtime. 30 tablet 2   rosuvastatin (CRESTOR) 20 MG tablet Take 20 mg by mouth daily.  5   Sennosides 15 MG TABS Take 15 mg by mouth daily. Takes 8 tablets a night     valbenazine (INGREZZA) 40 MG capsule TAKE 1 CAPSULE BY MOUTH DAILY 30 capsule 1   No current facility-administered medications for this visit.    Medication Side Effects: None  Allergies: No Known Allergies  Past Medical History:  Diagnosis Date   Agatston coronary artery calcium score between 100 and 199 08/26/2017   Borderline hyperlipidemia    as of 10/2016: TC 134, TG 90, HDL 51, LDL 95 (PCP recently increase rosuvastatin dose from 10 to 20 mg)   Borderline hypertension    Coronary artery disease involving native coronary artery of native heart without angina pectoris 02/18/2020   Family history of premature coronary artery disease 08/26/2017   Hx of CABG x 4 06/17/2021   Hyperlipidemia due to dietary fat intake 08/26/2017  Ischemic cardiomyopathy 06/17/2021    Past Medical History, Surgical history, Social history, and Family history were reviewed and updated as appropriate.   Please see review of systems for further details on the patient's review from today.   Objective:   Physical Exam:  There were no vitals taken for this visit.  Physical Exam Constitutional:      General: She is not in acute distress. Musculoskeletal:        General: No deformity.  Neurological:     Mental Status: She is alert and oriented to person, place, and time.     Coordination:  Coordination normal.  Psychiatric:        Attention and Perception: Attention and perception normal. She does not perceive auditory or visual hallucinations.        Mood and Affect: Mood normal. Mood is not anxious or depressed. Affect is not labile, blunt, angry or inappropriate.        Speech: Speech normal.        Behavior: Behavior normal.        Thought Content: Thought content normal. Thought content is not paranoid or delusional. Thought content does not include homicidal or suicidal ideation. Thought content does not include homicidal or suicidal plan.        Cognition and Memory: Cognition and memory normal.        Judgment: Judgment normal.     Comments: Insight intact     Lab Review:     Component Value Date/Time   NA 141 10/16/2017 1412   K 4.6 10/16/2017 1412   CL 102 10/16/2017 1412   CO2 25 10/16/2017 1412   GLUCOSE 86 10/16/2017 1412   BUN 14 10/16/2017 1412   CREATININE 0.79 10/16/2017 1412   CALCIUM 9.0 10/16/2017 1412   GFRNONAA 83 10/16/2017 1412   GFRAA 95 10/16/2017 1412    No results found for: "WBC", "RBC", "HGB", "HCT", "PLT", "MCV", "MCH", "MCHC", "RDW", "LYMPHSABS", "MONOABS", "EOSABS", "BASOSABS"  No results found for: "POCLITH", "LITHIUM"   No results found for: "PHENYTOIN", "PHENOBARB", "VALPROATE", "CBMZ"   .res Assessment: Plan:    Plan:  PDMP reviewed  Clonazepam 1mg  daily - takes at bedtime. Remeron 15mg  at hs Ingrezza 40mg  daily - TD less - but still visible - legs and mouth. Patient does not wish to increase at this time. Wellbutrin XL 150mg  every morning  Time spent with patient was 25 minutes. Greater than 50% of face to face time with patient was spent on counseling and coordination of care.    RTC 2 weeks.  Patient advised to contact office with any questions, adverse effects, or acute worsening in signs and symptoms.   Discussed potential benefits, risk, and side effects of benzodiazepines to include potential risk of  tolerance and dependence, as well as possible drowsiness.  Advised patient not to drive if experiencing drowsiness and to take lowest possible effective dose to minimize risk of dependence and tolerance.   Discussed potential metabolic side effects associated with atypical antipsychotics, as well as potential risk for movement side effects. Advised pt to contact office if movement side effects occur.    Diagnoses and all orders for this visit:  Major depressive disorder, recurrent episode, moderate (HCC)  Generalized anxiety disorder  Panic attacks  Tardive dyskinesia  Akathisia     Please see After Visit Summary for patient specific instructions.  Future Appointments  Date Time Provider [place]  01/01/2022  1:55 PM Lenna Sciara, NP [facility] None  01/10/2022  3:20 PM Arjen Deringer, Thereasa Solo, NP [facility] None  02/12/2022  4:00 PM Waldron Session, [facility] None    No orders of the defined types were placed in this encounter.   -------------------------------

## 2021-12-31 ENCOUNTER — Other Ambulatory Visit (HOSPITAL_COMMUNITY): Payer: Self-pay

## 2022-01-01 ENCOUNTER — Other Ambulatory Visit (HOSPITAL_COMMUNITY): Payer: Self-pay

## 2022-01-01 ENCOUNTER — Other Ambulatory Visit: Payer: Self-pay

## 2022-01-01 ENCOUNTER — Encounter: Payer: Self-pay | Admitting: Nurse Practitioner

## 2022-01-01 ENCOUNTER — Ambulatory Visit: Payer: BC Managed Care – PPO | Attending: Cardiology | Admitting: Nurse Practitioner

## 2022-01-01 ENCOUNTER — Other Ambulatory Visit (HOSPITAL_BASED_OUTPATIENT_CLINIC_OR_DEPARTMENT_OTHER): Payer: Self-pay

## 2022-01-01 ENCOUNTER — Telehealth: Payer: Self-pay | Admitting: Adult Health

## 2022-01-01 VITALS — BP 125/76 | HR 78 | Wt 145.6 lb

## 2022-01-01 DIAGNOSIS — I2510 Atherosclerotic heart disease of native coronary artery without angina pectoris: Secondary | ICD-10-CM

## 2022-01-01 DIAGNOSIS — I255 Ischemic cardiomyopathy: Secondary | ICD-10-CM

## 2022-01-01 DIAGNOSIS — R0609 Other forms of dyspnea: Secondary | ICD-10-CM

## 2022-01-01 DIAGNOSIS — E785 Hyperlipidemia, unspecified: Secondary | ICD-10-CM

## 2022-01-01 DIAGNOSIS — Z951 Presence of aortocoronary bypass graft: Secondary | ICD-10-CM

## 2022-01-01 DIAGNOSIS — R5382 Chronic fatigue, unspecified: Secondary | ICD-10-CM

## 2022-01-01 DIAGNOSIS — G2401 Drug induced subacute dyskinesia: Secondary | ICD-10-CM

## 2022-01-01 MED ORDER — VALBENAZINE TOSYLATE 40 MG PO CAPS
40.0000 mg | ORAL_CAPSULE | Freq: Every day | ORAL | 1 refills | Status: DC
  Filled 2022-01-01 (×4): qty 30, 30d supply, fill #0

## 2022-01-01 MED ORDER — EZETIMIBE 10 MG PO TABS: 10.0000 mg | ORAL_TABLET | Freq: Every day | ORAL | 3 refills | Status: DC

## 2022-01-01 NOTE — Patient Instructions (Signed)
Medication Instructions:  Start Zetia 10 mg daily  *If you need a refill on your cardiac medications before your next appointment, please call your pharmacy*   Lab Work: Your physician recommends that you return for lab work in 6-8 weeks. Fasting Lipid panel & LFTs  If you have labs (blood work) drawn today and your tests are completely normal, you will receive your results only by: MyChart Message (if you have MyChart) OR A paper copy in the mail If you have any lab test that is abnormal or we need to change your treatment, we will call you to review the results.   Testing/Procedures: NONE ordered at this time of appointment     Follow-Up: At [facility], you and your health needs are our priority.  As part of our continuing mission to provide you with exceptional heart care, we have created designated Provider Care Teams.  These Care Teams include your primary Cardiologist (physician) and Advanced Practice Providers (APPs -  Physician Assistants and Nurse Practitioners) who all work together to provide you with the care you need, when you need it.  We recommend signing up for the patient portal called "MyChart".  Sign up information is provided on this After Visit Summary.  MyChart is used to connect with patients for Virtual Visits (Telemedicine).  Patients are able to view lab/test results, encounter notes, upcoming appointments, etc.  Non-urgent messages can be sent to your provider as well.   To learn more about what you can do with MyChart, go to [email or web address].    Your next appointment:   6 month(s)  The format for your next appointment:   In Person  Provider:   Bryan Lemma, MD     Other Instructions   Important Information About Sugar

## 2022-01-01 NOTE — Telephone Encounter (Signed)
Next visit is 01/10/22. Requesting refill on Ingrezza called to:  [facility], [place] - [address] [place] [address] Phone: [phone]  Fax: [phone]      Phone: [phone]  Fax: [phone]

## 2022-01-01 NOTE — Telephone Encounter (Signed)
Prior Approval received with [employer] for Ingrezza 40 mg effective 12/26/2021-12/16/2022. Per her pharmacy assignment they require it to go to [facility], [phone], fax [phone]

## 2022-01-01 NOTE — Progress Notes (Signed)
Office Visit    Patient Name: Heather Lucas Date of Encounter: 01/01/2022  Primary Care Provider:  Cleatis Polka., MD Primary Cardiologist:  Bryan Lemma, MD  Chief Complaint    61 year old female with a history of CAD s/p CABG x 4, ICM, hyperlipidemia, dyspnea on exertion, depression, and tardive dyskinesia who presents for follow-up related to CAD.  Past Medical History    Past Medical History:  Diagnosis Date   Agatston coronary artery calcium score between 100 and 199 08/26/2017   Borderline hyperlipidemia    as of 10/2016: TC 134, TG 90, HDL 51, LDL 95 (PCP recently increase rosuvastatin dose from 10 to 20 mg)   Borderline hypertension    Coronary artery disease involving native coronary artery of native heart without angina pectoris 02/18/2020   Family history of premature coronary artery disease 08/26/2017   Hx of CABG x 4 06/17/2021   Hyperlipidemia due to dietary fat intake 08/26/2017   Ischemic cardiomyopathy 06/17/2021   Past Surgical History:  Procedure Laterality Date   CORONARY ARTERY BYPASS GRAFT  03/2020   [facility] [place], [place] -> reportedly CABG x4   LAPAROSCOPIC CHOLECYSTECTOMY  1995   LEFT HEART CATH AND CORONARY ANGIOGRAPHY  02/2020   [facility], [place]) -> multivessel disease, referred for CABG   NM MYOVIEW LTD  02/2018   Clearly appears abnormal, referred for [facility], [place]   TRANSTHORACIC ECHOCARDIOGRAM  02/2020   Several echoes in January and February 2022-initially noted to be abnormal along with Myoview stress test, immediately post CABG recheck    Allergies  No Known Allergies  History of Present Illness    61 year old female with the above past medical history including CAD s/p CABG x 4, ICM, hyperlipidemia, dyspnea on exertion, depression, and tardive dyskinesia.  She was evaluated in July 2019 in the setting of abnormal coronary calcium score and fatigue.  She subsequently moved to [place]  to live near her family and while she was there underwent CABG x 4.  It was noted she had cardiomyopathy with reduced EF.  She was last seen in the office on 06/17/2021 and was stable from a cardiac standpoint though she did note stable chronic exertional dyspnea and chronic fatigue.  Echocardiogram showed EF 65 to 70%, normal LV function, no WMA, no significant valvular abnormalities.  She presents today for follow-up.  Since her last visit she has done well from a cardiac standpoint. She denies symptoms concerning for angina, denies dyspnea or fatigue. BP has been stable. Overall, she reports feeling well.   Home Medications    Current Outpatient Medications  Medication Sig Dispense Refill   aspirin 81 MG EC tablet Take 81 mg by mouth daily.     benztropine (COGENTIN) 0.5 MG tablet Take one tablet at bedtime for one week, then increase to one tablet twice daily. 60 tablet 2   buPROPion (WELLBUTRIN XL) 150 MG 24 hr tablet Take 1 tablet (150 mg total) by mouth daily. 30 tablet 2   clonazePAM (KLONOPIN) 1 MG tablet TAKE ONE TABLET BY MOUTH TWICE DAILY 60 tablet 0   clopidogrel (PLAVIX) 75 MG tablet Take 75 mg by mouth daily.     ergocalciferol (VITAMIN D2) 1.25 MG (50000 UT) capsule Take 50,000 Units by mouth daily.     ezetimibe (ZETIA) 10 MG tablet Take 1 tablet (10 mg total) by mouth daily. 90 tablet 3   midodrine (PROAMATINE) 5 MG tablet Take 5 mg  in the morning  and take 2.5 mg ( 1/2 tablet ) in the evening.     mirtazapine (REMERON) 15 MG tablet Take 1 tablet (15 mg total) by mouth at bedtime. 30 tablet 2   rosuvastatin (CRESTOR) 20 MG tablet Take 20 mg by mouth daily.  5   Sennosides 15 MG TABS Take 15 mg by mouth daily. Takes 8 tablets a night     valbenazine (INGREZZA) 40 MG capsule Take 1 capsule (40 mg total) by mouth daily. 30 capsule 1   No current facility-administered medications for this visit.     Review of Systems    She denies chest pain, palpitations, dyspnea, pnd,  orthopnea, n, v, dizziness, syncope, edema, weight gain, or early satiety. All other systems reviewed and are otherwise negative except as noted above.   Physical Exam    VS:  BP 125/76   Pulse 78   Wt 145 lb 9.6 oz (66 kg)   SpO2 95%   BMI 26.63 kg/m  GEN: Well nourished, well developed, in no acute distress. HEENT: normal. Neck: Supple, no JVD, carotid bruits, or masses. Cardiac: RRR, no murmurs, rubs, or gallops. No clubbing, cyanosis, edema.  Radials/DP/PT 2+ and equal bilaterally.  Respiratory:  Respirations regular and unlabored, clear to auscultation bilaterally. GI: Soft, nontender, nondistended, BS + x 4. MS: no deformity or atrophy. Skin: warm and dry, no rash. Neuro:  Strength and sensation are intact. Psych: Normal affect.  Accessory Clinical Findings    ECG personally reviewed by me today -NSR, 70 bpm, incomplete RBBB- no acute changes.   No results found for: "WBC", "HGB", "HCT", "MCV", "PLT" Lab Results  Component Value Date   CREATININE 0.79 10/16/2017   BUN 14 10/16/2017   NA 141 10/16/2017   K 4.6 10/16/2017   CL 102 10/16/2017   CO2 25 10/16/2017   No results found for: "ALT", "AST", "GGT", "ALKPHOS", "BILITOT" No results found for: "CHOL", "HDL", "LDLCALC", "LDLDIRECT", "TRIG", "CHOLHDL"  No results found for: "HGBA1C"  Assessment & Plan    1. CAD: S/p CABG x 4. Stable with no anginal symptoms. No indication for ischemic evaluation. Continue aspirin, Plavix, Crestor.  2. ICM: Recent echocardiogram in 06/2021 showed EF 65 to 70%, normal LV function, no RWMA, no significant valvular abnormalities. Euvolemic and well compensated on exam.  3. Hyperlipidemia: LDL was 77 in 08/2021. Will start Zetia 10 mg daily. Continue Crestor. Will check fasting lipids, LFTs in 6-8 weeks. If LDL remains elevated above goal, consider referral to lipid clinic pharmD.   4. Hypotension: BP well controlled. Continue midodrine.  5. Chronic fatigue/chronic dyspnea: Resolved.    6. Disposition: Follow-up in 6 months with Dr. Herbie Baltimore.      Joylene Grapes, NP 01/01/2022, 5:08 PM

## 2022-01-03 ENCOUNTER — Other Ambulatory Visit (HOSPITAL_COMMUNITY): Payer: Self-pay

## 2022-01-07 ENCOUNTER — Other Ambulatory Visit (HOSPITAL_COMMUNITY): Payer: Self-pay

## 2022-01-10 ENCOUNTER — Encounter: Payer: Self-pay | Admitting: Adult Health

## 2022-01-10 ENCOUNTER — Ambulatory Visit: Payer: BC Managed Care – PPO | Admitting: Adult Health

## 2022-01-10 DIAGNOSIS — F410 Panic disorder [episodic paroxysmal anxiety] without agoraphobia: Secondary | ICD-10-CM

## 2022-01-10 DIAGNOSIS — F331 Major depressive disorder, recurrent, moderate: Secondary | ICD-10-CM

## 2022-01-10 DIAGNOSIS — F411 Generalized anxiety disorder: Secondary | ICD-10-CM

## 2022-01-10 DIAGNOSIS — G2401 Drug induced subacute dyskinesia: Secondary | ICD-10-CM | POA: Diagnosis not present

## 2022-01-10 MED ORDER — MIRTAZAPINE 15 MG PO TABS: 15.0000 mg | ORAL_TABLET | Freq: Every day | ORAL | 2 refills | Status: DC

## 2022-01-10 MED ORDER — BUPROPION HCL ER (XL) 300 MG PO TB24: 300.0000 mg | ORAL_TABLET | Freq: Every day | ORAL | 2 refills | Status: DC

## 2022-01-10 MED ORDER — BUPROPION HCL ER (XL) 150 MG PO TB24: 150.0000 mg | ORAL_TABLET | Freq: Every day | ORAL | 2 refills | Status: DC

## 2022-01-10 MED ORDER — VALBENAZINE TOSYLATE 40 MG PO CAPS: 40.0000 mg | ORAL_CAPSULE | Freq: Every day | ORAL | 5 refills | Status: DC

## 2022-01-10 MED ORDER — CLONAZEPAM 1 MG PO TABS: ORAL_TABLET | ORAL | 2 refills | Status: DC

## 2022-01-10 NOTE — Progress Notes (Signed)
Heather Lucas [id number] 1960-03-11 61 y.o.  Subjective:   Patient ID:  Heather Lucas is a 61 y.o. (DOB 28-Oct-1960) female.  Chief Complaint: No chief complaint on file.   HPI Heather Lucas presents to the office today for follow-up of MDD, GAD, panic attacks and TD.  Describes mood today as "ok". Pleasant. Denies tearfulness. Mood symptoms - reports some anxiety - moving, car, finances". Denies irritability. Reports an increase in depression - "having to push herself to do things". Reports some worry and rumination. Mood is more consistent. Stating "I feel like I'm doing better in some areas". Stable interest and motivation. Taking medications as prescribed.  Energy levels lower. Active, does not have a regular exercise routine Enjoys some usual interests and activities. Single. Lives with a roommate. Brother in [place]. Friends local. Attends church. Appetite adequate. Weight stable - 145 pounds. Sleeps well most nights. Averages 9 hours. Focus and concentration improved. Completing tasks. Managing aspects of household. 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  Denies AH or VH. Denies self harm. Denies substance use.   Previous medication trials:  Zoloft, Prozac, Wellbutrin, Lexapro, Trintellix, Hydroxyzine.   AIMS    Flowsheet Row Office Visit from 12/13/2021 in [facility]  AIMS Total Score 28        Review of Systems:  Review of Systems  Musculoskeletal:  Negative for gait problem.  Neurological:  Negative for tremors.  Psychiatric/Behavioral:         Please refer to HPI    Medications: I have reviewed the patient's current medications.  Current Outpatient Medications  Medication Sig Dispense Refill   aspirin 81 MG EC tablet Take 81 mg by mouth daily.     buPROPion (WELLBUTRIN XL) 300 MG 24 hr tablet Take 1 tablet (300 mg total) by mouth daily. 30 tablet 2   clonazePAM (KLONOPIN) 1 MG tablet TAKE ONE TABLET BY MOUTH TWICE DAILY 60  tablet 2   clopidogrel (PLAVIX) 75 MG tablet Take 75 mg by mouth daily.     ergocalciferol (VITAMIN D2) 1.25 MG (50000 UT) capsule Take 50,000 Units by mouth daily.     ezetimibe (ZETIA) 10 MG tablet Take 1 tablet (10 mg total) by mouth daily. 90 tablet 3   midodrine (PROAMATINE) 5 MG tablet Take 5 mg  in the morning  and take 2.5 mg ( 1/2 tablet ) in the evening.     mirtazapine (REMERON) 15 MG tablet Take 1 tablet (15 mg total) by mouth at bedtime. 30 tablet 2   rosuvastatin (CRESTOR) 20 MG tablet Take 20 mg by mouth daily.  5   Sennosides 15 MG TABS Take 15 mg by mouth daily. Takes 8 tablets a night     valbenazine (INGREZZA) 40 MG capsule Take 1 capsule (40 mg total) by mouth daily. 30 capsule 5   No current facility-administered medications for this visit.    Medication Side Effects: None  Allergies: No Known Allergies  Past Medical History:  Diagnosis Date   Agatston coronary artery calcium score between 100 and 199 08/26/2017   Borderline hyperlipidemia    as of 10/2016: TC 134, TG 90, HDL 51, LDL 95 (PCP recently increase rosuvastatin dose from 10 to 20 mg)   Borderline hypertension    Coronary artery disease involving native coronary artery of native heart without angina pectoris 02/18/2020   Family history of premature coronary artery disease 08/26/2017   Hx of CABG x 4 06/17/2021   Hyperlipidemia due to dietary  fat intake 08/26/2017   Ischemic cardiomyopathy 06/17/2021    Past Medical History, Surgical history, Social history, and Family history were reviewed and updated as appropriate.   Please see review of systems for further details on the patient's review from today.   Objective:   Physical Exam:  There were no vitals taken for this visit.  Physical Exam Constitutional:      General: She is not in acute distress. Musculoskeletal:        General: No deformity.  Neurological:     Mental Status: She is alert and oriented to person, place, and time.     Coordination:  Coordination normal.  Psychiatric:        Attention and Perception: Attention and perception normal. She does not perceive auditory or visual hallucinations.        Mood and Affect: Mood normal. Mood is not anxious or depressed. Affect is not labile, blunt, angry or inappropriate.        Speech: Speech normal.        Behavior: Behavior normal.        Thought Content: Thought content normal. Thought content is not paranoid or delusional. Thought content does not include homicidal or suicidal ideation. Thought content does not include homicidal or suicidal plan.        Cognition and Memory: Cognition and memory normal.        Judgment: Judgment normal.     Comments: Insight intact     Lab Review:     Component Value Date/Time   NA 141 10/16/2017 1412   K 4.6 10/16/2017 1412   CL 102 10/16/2017 1412   CO2 25 10/16/2017 1412   GLUCOSE 86 10/16/2017 1412   BUN 14 10/16/2017 1412   CREATININE 0.79 10/16/2017 1412   CALCIUM 9.0 10/16/2017 1412   GFRNONAA 83 10/16/2017 1412   GFRAA 95 10/16/2017 1412    No results found for: "WBC", "RBC", "HGB", "HCT", "PLT", "MCV", "MCH", "MCHC", "RDW", "LYMPHSABS", "MONOABS", "EOSABS", "BASOSABS"  No results found for: "POCLITH", "LITHIUM"   No results found for: "PHENYTOIN", "PHENOBARB", "VALPROATE", "CBMZ"   .res Assessment: Plan:    Plan:  PDMP reviewed  Clonazepam 1mg  daily - takes at bedtime. Remeron 15mg  at hs Ingrezza 40mg  daily - TD less - but still visible - legs and mouth. Patient does not wish to increase at this time. Increase Wellbutrin XL150mg  to 300mg  every morning  Time spent with patient was 25 minutes. Greater than 50% of face to face time with patient was spent on counseling and coordination of care.    RTC 2 weeks.  Patient advised to contact office with any questions, adverse effects, or acute worsening in signs and symptoms.   Discussed potential benefits, risk, and side effects of benzodiazepines to include  potential risk of tolerance and dependence, as well as possible drowsiness.  Advised patient not to drive if experiencing drowsiness and to take lowest possible effective dose to minimize risk of dependence and tolerance.   Discussed potential metabolic side effects associated with atypical antipsychotics, as well as potential risk for movement side effects. Advised pt to contact office if movement side effects occur.    Diagnoses and all orders for this visit:  Major depressive disorder, recurrent episode, moderate (HCC) -     mirtazapine (REMERON) 15 MG tablet; Take 1 tablet (15 mg total) by mouth at bedtime. -     Discontinue: buPROPion (WELLBUTRIN XL) 150 MG 24 hr tablet; Take 1 tablet (150 mg total)  by mouth daily. -     buPROPion (WELLBUTRIN XL) 300 MG 24 hr tablet; Take 1 tablet (300 mg total) by mouth daily.  Tardive dyskinesia -     valbenazine (INGREZZA) 40 MG capsule; Take 1 capsule (40 mg total) by mouth daily.  Generalized anxiety disorder -     mirtazapine (REMERON) 15 MG tablet; Take 1 tablet (15 mg total) by mouth at bedtime. -     clonazePAM (KLONOPIN) 1 MG tablet; TAKE ONE TABLET BY MOUTH TWICE DAILY  Panic attacks     Please see After Visit Summary for patient specific instructions.  Future Appointments  Date Time Provider Department Center  02/12/2022  4:00 PM Waldron Session, [facility] None  07/02/2022  4:20 PM Marykay Lex, MD [facility] None    No orders of the defined types were placed in this encounter.   -------------------------------

## 2022-01-24 ENCOUNTER — Ambulatory Visit: Payer: BC Managed Care – PPO | Admitting: Adult Health

## 2022-01-28 ENCOUNTER — Other Ambulatory Visit (HOSPITAL_COMMUNITY): Payer: Self-pay

## 2022-01-28 ENCOUNTER — Other Ambulatory Visit: Payer: Self-pay | Admitting: Adult Health

## 2022-01-28 DIAGNOSIS — G2401 Drug induced subacute dyskinesia: Secondary | ICD-10-CM

## 2022-01-28 MED ORDER — VALBENAZINE TOSYLATE 40 MG PO CAPS
40.0000 mg | ORAL_CAPSULE | Freq: Every day | ORAL | 0 refills | Status: DC
  Filled 2022-01-28: qty 30, 30d supply, fill #0

## 2022-01-29 ENCOUNTER — Other Ambulatory Visit (HOSPITAL_COMMUNITY): Payer: Self-pay

## 2022-01-30 ENCOUNTER — Other Ambulatory Visit (HOSPITAL_COMMUNITY): Payer: Self-pay

## 2022-01-31 ENCOUNTER — Encounter: Payer: Self-pay | Admitting: Adult Health

## 2022-01-31 ENCOUNTER — Ambulatory Visit: Payer: BC Managed Care – PPO | Admitting: Adult Health

## 2022-01-31 DIAGNOSIS — F331 Major depressive disorder, recurrent, moderate: Secondary | ICD-10-CM

## 2022-01-31 DIAGNOSIS — F411 Generalized anxiety disorder: Secondary | ICD-10-CM | POA: Diagnosis not present

## 2022-01-31 DIAGNOSIS — G2401 Drug induced subacute dyskinesia: Secondary | ICD-10-CM

## 2022-01-31 DIAGNOSIS — F410 Panic disorder [episodic paroxysmal anxiety] without agoraphobia: Secondary | ICD-10-CM | POA: Diagnosis not present

## 2022-01-31 NOTE — Progress Notes (Signed)
Heather Lucas [id number] 07/21/1960 61 y.o.  Subjective:   Patient ID:  Heather Lucas is a 61 y.o. (DOB 05-Oct-1960) female.  Chief Complaint: No chief complaint on file.   HPI Aneli Zara presents to the office today for follow-up of MDD, GAD, panic attacks and TD.  Describes mood today as "ok". Pleasant. Denies tearfulness. Mood symptoms - reports some anxiety and depression. Denies irritability. Reports some worry and rumination. Mood is more consistent. Feels like the increase in Wellbutrin has been helpful. Stating "I feel like I'm doing better". Would like to consider increase in Ingrezza for TD symptoms. Stable interest and motivation. Taking medications as prescribed.  Energy levels improved. Active, does not have a regular exercise routine Enjoys some usual interests and activities. Single. Lives with a roommate. Brother in [place]. Friends local. Attends church. Appetite adequate. Weight stable - 145 pounds. Sleeps well most nights. Averages 9 hours. Focus and concentration improved. Completing tasks. Managing aspects of household. 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  Denies AH or VH. Denies self harm. Denies substance use.   Previous medication trials:  Zoloft, Prozac, Wellbutrin, Lexapro, Trintellix, Hydroxyzine.   AIMS    Flowsheet Row Office Visit from 12/13/2021 in [facility]  AIMS Total Score 28        Review of Systems:  Review of Systems  Musculoskeletal:  Negative for gait problem.  Neurological:  Negative for tremors.  Psychiatric/Behavioral:         Please refer to HPI    Medications: I have reviewed the patient's current medications.  Current Outpatient Medications  Medication Sig Dispense Refill   aspirin 81 MG EC tablet Take 81 mg by mouth daily.     buPROPion (WELLBUTRIN XL) 300 MG 24 hr tablet Take 1 tablet (300 mg total) by mouth daily. 30 tablet 2   clonazePAM (KLONOPIN) 1 MG tablet TAKE ONE TABLET BY  MOUTH TWICE DAILY 60 tablet 2   clopidogrel (PLAVIX) 75 MG tablet Take 75 mg by mouth daily.     ergocalciferol (VITAMIN D2) 1.25 MG (50000 UT) capsule Take 50,000 Units by mouth daily.     ezetimibe (ZETIA) 10 MG tablet Take 1 tablet (10 mg total) by mouth daily. 90 tablet 3   midodrine (PROAMATINE) 5 MG tablet Take 5 mg  in the morning  and take 2.5 mg ( 1/2 tablet ) in the evening.     mirtazapine (REMERON) 15 MG tablet Take 1 tablet (15 mg total) by mouth at bedtime. 30 tablet 2   rosuvastatin (CRESTOR) 20 MG tablet Take 20 mg by mouth daily.  5   Sennosides 15 MG TABS Take 15 mg by mouth daily. Takes 8 tablets a night     valbenazine (INGREZZA) 40 MG capsule Take 1 capsule (40 mg total) by mouth daily. 30 capsule 5   valbenazine (INGREZZA) 40 MG capsule Take 1 capsule (40 mg total) by mouth daily. 30 capsule 0   No current facility-administered medications for this visit.    Medication Side Effects: None  Allergies: No Known Allergies  Past Medical History:  Diagnosis Date   Agatston coronary artery calcium score between 100 and 199 08/26/2017   Borderline hyperlipidemia    as of 10/2016: TC 134, TG 90, HDL 51, LDL 95 (PCP recently increase rosuvastatin dose from 10 to 20 mg)   Borderline hypertension    Coronary artery disease involving native coronary artery of native heart without angina pectoris 02/18/2020   Family history  of premature coronary artery disease 08/26/2017   Hx of CABG x 4 06/17/2021   Hyperlipidemia due to dietary fat intake 08/26/2017   Ischemic cardiomyopathy 06/17/2021    Past Medical History, Surgical history, Social history, and Family history were reviewed and updated as appropriate.   Please see review of systems for further details on the patient's review from today.   Objective:   Physical Exam:  There were no vitals taken for this visit.  Physical Exam Constitutional:      General: She is not in acute distress. Musculoskeletal:        General: No  deformity.  Neurological:     Mental Status: She is alert and oriented to person, place, and time.     Coordination: Coordination normal.  Psychiatric:        Attention and Perception: Attention and perception normal. She does not perceive auditory or visual hallucinations.        Mood and Affect: Mood normal. Mood is not anxious or depressed. Affect is not labile, blunt, angry or inappropriate.        Speech: Speech normal.        Behavior: Behavior normal.        Thought Content: Thought content normal. Thought content is not paranoid or delusional. Thought content does not include homicidal or suicidal ideation. Thought content does not include homicidal or suicidal plan.        Cognition and Memory: Cognition and memory normal.        Judgment: Judgment normal.     Comments: Insight intact     Lab Review:     Component Value Date/Time   NA 141 10/16/2017 1412   K 4.6 10/16/2017 1412   CL 102 10/16/2017 1412   CO2 25 10/16/2017 1412   GLUCOSE 86 10/16/2017 1412   BUN 14 10/16/2017 1412   CREATININE 0.79 10/16/2017 1412   CALCIUM 9.0 10/16/2017 1412   GFRNONAA 83 10/16/2017 1412   GFRAA 95 10/16/2017 1412    No results found for: "WBC", "RBC", "HGB", "HCT", "PLT", "MCV", "MCH", "MCHC", "RDW", "LYMPHSABS", "MONOABS", "EOSABS", "BASOSABS"  No results found for: "POCLITH", "LITHIUM"   No results found for: "PHENYTOIN", "PHENOBARB", "VALPROATE", "CBMZ"   .res Assessment: Plan:    Plan:  PDMP reviewed  Clonazepam 1mg  daily - takes at bedtime. Remeron 15mg  at hs Ingrezza 40mg  daily - TD less - but still visible - legs and mouth. Patient does not wish to increase at this time. Wellbutrin XL 300mg  every morning  Time spent with patient was 25 minutes. Greater than 50% of face to face time with patient was spent on counseling and coordination of care.    RTC 2 weeks.  Patient advised to contact office with any questions, adverse effects, or acute worsening in signs and  symptoms.   Discussed potential benefits, risk, and side effects of benzodiazepines to include potential risk of tolerance and dependence, as well as possible drowsiness.  Advised patient not to drive if experiencing drowsiness and to take lowest possible effective dose to minimize risk of dependence and tolerance.   Discussed potential metabolic side effects associated with atypical antipsychotics, as well as potential risk for movement side effects. Advised pt to contact office if movement side effects occur.   Diagnoses and all orders for this visit:  Major depressive disorder, recurrent episode, moderate (HCC)  Generalized anxiety disorder  Tardive dyskinesia  Panic attacks     Please see After Visit Summary for patient specific instructions.  Future Appointments  Date Time Provider Department Center  02/12/2022  4:00 PM Waldron Session, [facility] None  07/02/2022  4:20 PM Marykay Lex, MD [facility] None    No orders of the defined types were placed in this encounter.   -------------------------------

## 2022-02-04 ENCOUNTER — Other Ambulatory Visit (HOSPITAL_BASED_OUTPATIENT_CLINIC_OR_DEPARTMENT_OTHER): Payer: Self-pay

## 2022-02-11 ENCOUNTER — Telehealth: Payer: Self-pay | Admitting: Adult Health

## 2022-02-11 NOTE — Telephone Encounter (Signed)
We gave her samples of the 60mg .

## 2022-02-11 NOTE — Telephone Encounter (Signed)
Adding note:  Pt called 02/11/22 at 2:42 and LM for provider. She is taking Ingrezza 60mg  now and does not notice any difference with how she feels.

## 2022-02-11 NOTE — Telephone Encounter (Signed)
I am not sure if you just gave her 60 mg samples? We can try 80 mg now if you want.

## 2022-02-12 ENCOUNTER — Ambulatory Visit: Payer: BC Managed Care – PPO | Admitting: Mental Health

## 2022-02-12 DIAGNOSIS — F331 Major depressive disorder, recurrent, moderate: Secondary | ICD-10-CM

## 2022-02-12 NOTE — Progress Notes (Signed)
[facility]  Name: Heather Lucas Date: 02/12/2022 MRN: [id number] DOB: 1960/09/25 PCP: Ginger Organ., MD  Time spent:  53 minutes  Treatment:  Ind. therapy  Mental Status Exam:    Appearance:    Casual     Behavior:   Appropriate  Motor:   WNL  Speech/Language:    Clear and Coherent  Affect:   Full range   Mood:   anxious  Thought process:   Logical, linear, goal directed  Thought content:     WNL  Sensory/Perceptual disturbances:     none  Orientation:   x4  Attention:   Good  Concentration:   Good  Memory:   Intact  Fund of knowledge:    Consistent with age and development  Insight:     Good  Judgment:    Good  Impulse Control:   Good     Reported Symptoms:  anxiety daily (10/10), depression (3/10)  Risk Assessment: Danger to Self:  No Self-injurious Behavior: No Danger to Others: No Duty to Warn:no Physical Aggression / Violence:No  Access to Firearms a concern: No  Gang Involvement:No  Patient / guardian was educated about steps to take if suicide or homicide risk level increases between visits: yes While future psychiatric events cannot be accurately predicted, the patient does not currently require acute inpatient psychiatric care and does not currently meet [facility] involuntary commitment criteria.  Medical History/Surgical History: Past Medical History:  Diagnosis Date   Agatston coronary artery calcium score between 100 and 199 08/26/2017   Borderline hyperlipidemia    as of 10/2016: TC 134, TG 90, HDL 51, LDL 95 (PCP recently increase rosuvastatin dose from 10 to 20 mg)   Borderline hypertension    Coronary artery disease involving native coronary artery of native heart without angina pectoris 02/18/2020   Family history of premature coronary artery disease 08/26/2017   Hx of CABG x 4 06/17/2021   Hyperlipidemia due to dietary fat intake 08/26/2017   Ischemic cardiomyopathy 06/17/2021    Past Surgical History:  Procedure  Laterality Date   CORONARY ARTERY BYPASS GRAFT  03/2020   [facility], [place], [place] -> reportedly CABG x4   [place]   LEFT HEART CATH AND CORONARY ANGIOGRAPHY  02/2020   [facility] [place], [place]) -> multivessel disease, referred for CABG   NM MYOVIEW LTD  02/2018   Clearly appears abnormal, referred for Erlene Quan, [place] ECHOCARDIOGRAM  02/2020   Several echoes in January and February 2022-initially noted to be abnormal along with Myoview stress test, immediately post CABG recheck    Medications: Current Outpatient Medications  Medication Sig Dispense Refill   aspirin 81 MG EC tablet Take 81 mg by mouth daily.     buPROPion (WELLBUTRIN XL) 300 MG 24 hr tablet Take 1 tablet (300 mg total) by mouth daily. 30 tablet 2   clonazePAM (KLONOPIN) 1 MG tablet TAKE ONE TABLET BY MOUTH TWICE DAILY 60 tablet 2   clopidogrel (PLAVIX) 75 MG tablet Take 75 mg by mouth daily.     ergocalciferol (VITAMIN D2) 1.25 MG (50000 UT) capsule Take 50,000 Units by mouth daily.     ezetimibe (ZETIA) 10 MG tablet Take 1 tablet (10 mg total) by mouth daily. 90 tablet 3   midodrine (PROAMATINE) 5 MG tablet Take 5 mg  in the morning  and take 2.5 mg ( 1/2 tablet ) in the evening.     mirtazapine (REMERON) 15  MG tablet Take 1 tablet (15 mg total) by mouth at bedtime. 30 tablet 2   rosuvastatin (CRESTOR) 20 MG tablet Take 20 mg by mouth daily.  5   Sennosides 15 MG TABS Take 15 mg by mouth daily. Takes 8 tablets a night     valbenazine (INGREZZA) 40 MG capsule Take 1 capsule (40 mg total) by mouth daily. 30 capsule 5   valbenazine (INGREZZA) 40 MG capsule Take 1 capsule (40 mg total) by mouth daily. 30 capsule 0   No current facility-administered medications for this visit.     Subjective:  Patient presents on time for today's session.  She shared recent events, progress since last visit.  She stated that she has been more effective at  work, less anxiety and feeling more confident in her ability to achieve her work duties and tasks.  She stated she had a recent review with her manager which went well overall and received some constructive criticism which she felt was helpful.  She continues to struggle at times getting to work on time.  Collaboratively, explored in detail barriers in the morning where she often will hit the "snooze button" which then can cause her to be late for work significantly.  She states she sets to alarms.  She plans to follow through by utilizing thought stopping as discussed in session and engaging in "start behaviors" to begin her day as opposed to procrastinating. She continues to cope with some financial stress, plans to move into her new apartment later this month as her previous roommate had to move out.  She stated this will be challenging financially due to her overall increased rent.  She identified wanting to improve self-care by engaging in some exercise and plans to begin in the next few weeks.  Interventions: CBT, supportive therapy, motivational interviewing  Diagnoses:    ICD-10-CM   1. Major depressive disorder, recurrent episode, moderate ([place])  F33.1          Plan: Patient is to use CBT, mindfulness and coping skills to help manage decrease symptoms associated with their diagnosis.   Long-term goals:   Maintain symptom reduction: The patient will report sustained reduction in symptoms of anxiety using both CBT and mindfulness interventions for 3 consecutive months progressively.  Improve emotional regulation: The patient will learn and apply CBT and mindfulness-based strategies to regulate emotions, such as mindfulness-based stress reduction and cognitive restructuring, and report an improvement in emotional regulation for at least 3 consecutive months progressively.  Short-term goal:  The patient will learn and apply CBT and mindfulness-based coping skills for managing anxiety and  practice using it between sessions.       2.   Increase awareness of automatic thoughts: The patient will learn to identify automatic thoughts and increase awareness of their impact on emotions and behaviors through both CBT and mindfulness-based interventions.       3.    Identify, challenge, and replace fearful/anxious/depressive self talk with positive, realistic, and empowering self talk that does not feed anxiety nor depression.      Anson [place], [facility]

## 2022-02-12 NOTE — Telephone Encounter (Signed)
Noted. Ty!

## 2022-02-12 NOTE — Telephone Encounter (Signed)
Rtc to patient and she reports no improvement on the Ingrezza 60 mg, advised her to increase to 80 mg Ingrezza. She will be in office this afternoon to see Gerald Stabs and I will leave samples of 80 mg for her. She does have some 40 mg left as well and told her she can take 2 of those also if needed.   She will call back with an update.

## 2022-02-24 ENCOUNTER — Encounter: Payer: Self-pay | Admitting: Adult Health

## 2022-02-24 ENCOUNTER — Ambulatory Visit: Payer: BC Managed Care – PPO | Admitting: Adult Health

## 2022-02-24 ENCOUNTER — Other Ambulatory Visit (HOSPITAL_COMMUNITY): Payer: Self-pay

## 2022-02-24 DIAGNOSIS — F411 Generalized anxiety disorder: Secondary | ICD-10-CM | POA: Diagnosis not present

## 2022-02-24 DIAGNOSIS — G2571 Drug induced akathisia: Secondary | ICD-10-CM | POA: Diagnosis not present

## 2022-02-24 DIAGNOSIS — F331 Major depressive disorder, recurrent, moderate: Secondary | ICD-10-CM

## 2022-02-24 DIAGNOSIS — F410 Panic disorder [episodic paroxysmal anxiety] without agoraphobia: Secondary | ICD-10-CM

## 2022-02-24 DIAGNOSIS — G2401 Drug induced subacute dyskinesia: Secondary | ICD-10-CM

## 2022-02-24 MED ORDER — VALBENAZINE TOSYLATE 80 MG PO CAPS
80.0000 mg | ORAL_CAPSULE | Freq: Every day | ORAL | 2 refills | Status: DC
  Filled 2022-02-24: qty 30, 30d supply, fill #0

## 2022-02-24 NOTE — Progress Notes (Signed)
Heather Lucas [id number] 12/10/1960 62 y.o.  Subjective:   Patient ID:  Heather Lucas is a 62 y.o. (DOB 03-07-1960) female.  Chief Complaint: No chief complaint on file.   HPI Clovis Warwick presents to the office today for follow-up of MDD, GAD, panic attacks and TD.  Describes mood today as "ok". Pleasant. Denies tearfulness. Mood symptoms - reports anxiety - sometimes - work and recent move. Feels depressed sometimes. Denies irritability. Reports some worry, rumination, and over thinking - living alone now and worrying about bills. Mood is more consistent. Stating "I feel like I'm doing ok". Feels like medications are helpful. Stable interest and motivation. Taking medications as prescribed.  Energy levels improved - "I still don't have a lot". Active, does not have a regular exercise routine Enjoys some usual interests and activities. Single. Lives with a roommate. Brother in [place]. Friends local. Attends church. Appetite adequate. Weight loss - 133 pounds. Sleeps well most nights. Averages 10 to 11 hours during the week and longer on the weekends. Focus and concentration improved. Completing tasks. Managing aspects of household. 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 Recently moved into an  Denies SI or HI.  Denies AH or VH. Denies self harm. Denies substance use.   Previous medication trials:  Zoloft, Prozac, Wellbutrin, Lexapro, Trintellix, Hydroxyzine.   AIMS    Flowsheet Row Office Visit from 12/13/2021 in [facility]  AIMS Total Score 28        Review of Systems:  Review of Systems  Musculoskeletal:  Negative for gait problem.  Neurological:  Negative for tremors.  Psychiatric/Behavioral:         Please refer to HPI    Medications: I have reviewed the patient's current medications.  Current Outpatient Medications  Medication Sig Dispense Refill   aspirin 81 MG EC tablet Take 81 mg by mouth daily.     buPROPion (WELLBUTRIN XL) 300 MG 24 hr  tablet Take 1 tablet (300 mg total) by mouth daily. 30 tablet 2   clonazePAM (KLONOPIN) 1 MG tablet TAKE ONE TABLET BY MOUTH TWICE DAILY 60 tablet 2   clopidogrel (PLAVIX) 75 MG tablet Take 75 mg by mouth daily.     ergocalciferol (VITAMIN D2) 1.25 MG (50000 UT) capsule Take 50,000 Units by mouth daily.     ezetimibe (ZETIA) 10 MG tablet Take 1 tablet (10 mg total) by mouth daily. 90 tablet 3   midodrine (PROAMATINE) 5 MG tablet Take 5 mg  in the morning  and take 2.5 mg ( 1/2 tablet ) in the evening.     mirtazapine (REMERON) 15 MG tablet Take 1 tablet (15 mg total) by mouth at bedtime. 30 tablet 2   rosuvastatin (CRESTOR) 20 MG tablet Take 20 mg by mouth daily.  5   Sennosides 15 MG TABS Take 15 mg by mouth daily. Takes 8 tablets a night     valbenazine (INGREZZA) 40 MG capsule Take 1 capsule (40 mg total) by mouth daily. 30 capsule 5   valbenazine (INGREZZA) 40 MG capsule Take 1 capsule (40 mg total) by mouth daily. 30 capsule 0   No current facility-administered medications for this visit.    Medication Side Effects: None  Allergies: No Known Allergies  Past Medical History:  Diagnosis Date   Agatston coronary artery calcium score between 100 and 199 08/26/2017   Borderline hyperlipidemia    as of 10/2016: TC 134, TG 90, HDL 51, LDL 95 (PCP recently increase rosuvastatin dose from 10 to 20 mg)  Borderline hypertension    Coronary artery disease involving native coronary artery of native heart without angina pectoris 02/18/2020   Family history of premature coronary artery disease 08/26/2017   Hx of CABG x 4 06/17/2021   Hyperlipidemia due to dietary fat intake 08/26/2017   Ischemic cardiomyopathy 06/17/2021    Past Medical History, Surgical history, Social history, and Family history were reviewed and updated as appropriate.   Please see review of systems for further details on the patient's review from today.   Objective:   Physical Exam:  There were no vitals taken for this  visit.  Physical Exam Constitutional:      General: She is not in acute distress. Musculoskeletal:        General: No deformity.  Neurological:     Mental Status: She is alert and oriented to person, place, and time.     Coordination: Coordination normal.  Psychiatric:        Attention and Perception: Attention and perception normal. She does not perceive auditory or visual hallucinations.        Mood and Affect: Mood normal. Mood is not anxious or depressed. Affect is not labile, blunt, angry or inappropriate.        Speech: Speech normal.        Behavior: Behavior normal.        Thought Content: Thought content normal. Thought content is not paranoid or delusional. Thought content does not include homicidal or suicidal ideation. Thought content does not include homicidal or suicidal plan.        Cognition and Memory: Cognition and memory normal.        Judgment: Judgment normal.     Comments: Insight intact     Lab Review:     Component Value Date/Time   NA 141 10/16/2017 1412   K 4.6 10/16/2017 1412   CL 102 10/16/2017 1412   CO2 25 10/16/2017 1412   GLUCOSE 86 10/16/2017 1412   BUN 14 10/16/2017 1412   CREATININE 0.79 10/16/2017 1412   CALCIUM 9.0 10/16/2017 1412   GFRNONAA 83 10/16/2017 1412   GFRAA 95 10/16/2017 1412    No results found for: "WBC", "RBC", "HGB", "HCT", "PLT", "MCV", "MCH", "MCHC", "RDW", "LYMPHSABS", "MONOABS", "EOSABS", "BASOSABS"  No results found for: "POCLITH", "LITHIUM"   No results found for: "PHENYTOIN", "PHENOBARB", "VALPROATE", "CBMZ"   .res Assessment: Plan:    Plan:  PDMP reviewed  Clonazepam 1mg  daily - takes at bedtime. Remeron 15mg  at hs Ingrezza 80mg  daily - TD less - but still visible - legs and mouth.  Wellbutrin XL 300mg  every morning  Time spent with patient was 25 minutes. Greater than 50% of face to face time with patient was spent on counseling and coordination of care.    RTC 4 weeks.  Patient advised to contact  office with any questions, adverse effects, or acute worsening in signs and symptoms.   Discussed potential benefits, risk, and side effects of benzodiazepines to include potential risk of tolerance and dependence, as well as possible drowsiness.  Advised patient not to drive if experiencing drowsiness and to take lowest possible effective dose to minimize risk of dependence and tolerance.   Discussed potential metabolic side effects associated with atypical antipsychotics, as well as potential risk for movement side effects. Advised pt to contact office if movement side effects occur Diagnoses and all orders for this visit:  Major depressive disorder, recurrent episode, moderate (HCC)  Generalized anxiety disorder  Panic attacks  Akathisia  Tardive dyskinesia     Please see After Visit Summary for patient specific instructions.  Future Appointments  Date Time Provider Department Center  03/18/2022  4:00 PM Waldron Session, [facility] None  04/10/2022  4:00 PM Waldron Session, [facility] None  07/02/2022  4:20 PM Marykay Lex, MD [facility] None    No orders of the defined types were placed in this encounter.   -------------------------------

## 2022-02-25 ENCOUNTER — Other Ambulatory Visit (HOSPITAL_COMMUNITY): Payer: Self-pay

## 2022-02-26 ENCOUNTER — Other Ambulatory Visit (HOSPITAL_COMMUNITY): Payer: Self-pay

## 2022-03-03 ENCOUNTER — Other Ambulatory Visit (HOSPITAL_COMMUNITY): Payer: Self-pay

## 2022-03-03 DIAGNOSIS — Z951 Presence of aortocoronary bypass graft: Secondary | ICD-10-CM | POA: Diagnosis not present

## 2022-03-03 DIAGNOSIS — I2510 Atherosclerotic heart disease of native coronary artery without angina pectoris: Secondary | ICD-10-CM | POA: Diagnosis not present

## 2022-03-03 DIAGNOSIS — I255 Ischemic cardiomyopathy: Secondary | ICD-10-CM | POA: Diagnosis not present

## 2022-03-03 DIAGNOSIS — E785 Hyperlipidemia, unspecified: Secondary | ICD-10-CM | POA: Diagnosis not present

## 2022-03-03 LAB — LIPID PANEL
Chol/HDL Ratio: 2.5 ratio (ref 0.0–4.4)
Cholesterol, Total: 110 mg/dL (ref 100–199)
HDL: 44 mg/dL (ref >39)
LDL Chol Calc (NIH): 49 mg/dL (ref 0–99)
Triglycerides: 89 mg/dL (ref 0–149)
VLDL Cholesterol Cal: 17 mg/dL (ref 5–40)

## 2022-03-03 LAB — HEPATIC FUNCTION PANEL
ALT: 18 IU/L (ref 0–32)
AST: 22 IU/L (ref 0–40)
Albumin: 4.1 g/dL (ref 3.9–4.9)
Alkaline Phosphatase: 92 IU/L (ref 44–121)
Bilirubin Total: 0.5 mg/dL (ref 0.0–1.2)
Bilirubin, Direct: 0.14 mg/dL (ref 0.00–0.40)
Total Protein: 6 g/dL (ref 6.0–8.5)

## 2022-03-14 DIAGNOSIS — H2513 Age-related nuclear cataract, bilateral: Secondary | ICD-10-CM | POA: Diagnosis not present

## 2022-03-18 ENCOUNTER — Ambulatory Visit (INDEPENDENT_AMBULATORY_CARE_PROVIDER_SITE_OTHER): Payer: BC Managed Care – PPO | Admitting: Mental Health

## 2022-03-18 DIAGNOSIS — F411 Generalized anxiety disorder: Secondary | ICD-10-CM

## 2022-03-18 NOTE — Progress Notes (Signed)
[facility]  Name: Heather Lucas Date: 03/18/2022 MRN: [id number] DOB: Jul 20, 1960 PCP: Ginger Organ., MD  Time spent:  54 minutes  Treatment:  Ind. therapy  Mental Status Exam:    Appearance:    Casual     Behavior:   Appropriate  Motor:   WNL  Speech/Language:    Clear and Coherent  Affect:   Full range   Mood:   anxious  Thought process:   Logical, linear, goal directed  Thought content:     WNL  Sensory/Perceptual disturbances:     none  Orientation:   x4  Attention:   Good  Concentration:   Good  Memory:   Intact  Fund of knowledge:    Consistent with age and development  Insight:     Good  Judgment:    Good  Impulse Control:   Good     Reported Symptoms:  anxiety daily (10/10), depression (3/10)  Risk Assessment: Danger to Self:  No Self-injurious Behavior: No Danger to Others: No Duty to Warn:no Physical Aggression / Violence:No  Access to Firearms a concern: No  Gang Involvement:No  Patient / guardian was educated about steps to take if suicide or homicide risk level increases between visits: yes While future psychiatric events cannot be accurately predicted, the patient does not currently require acute inpatient psychiatric care and does not currently meet [facility] involuntary commitment criteria.  Medical History/Surgical History: Past Medical History:  Diagnosis Date   Agatston coronary artery calcium score between 100 and 199 08/26/2017   Borderline hyperlipidemia    as of 10/2016: TC 134, TG 90, HDL 51, LDL 95 (PCP recently increase rosuvastatin dose from 10 to 20 mg)   Borderline hypertension    Coronary artery disease involving native coronary artery of native heart without angina pectoris 02/18/2020   Family history of premature coronary artery disease 08/26/2017   Hx of CABG x 4 06/17/2021   Hyperlipidemia due to dietary fat intake 08/26/2017   Ischemic cardiomyopathy 06/17/2021    Past Surgical History:  Procedure  Laterality Date   CORONARY ARTERY BYPASS GRAFT  03/2020   [facility], [place], [place] -> reportedly CABG x4   [place]   LEFT HEART CATH AND CORONARY ANGIOGRAPHY  02/2020   [facility], [place]) -> multivessel disease, referred for CABG   NM MYOVIEW LTD  02/2018   Clearly appears abnormal, referred for Erlene Quan, [place] ECHOCARDIOGRAM  02/2020   Several echoes in January and February 2022-initially noted to be abnormal along with Myoview stress test, immediately post CABG recheck    Medications: Current Outpatient Medications  Medication Sig Dispense Refill   aspirin 81 MG EC tablet Take 81 mg by mouth daily.     buPROPion (WELLBUTRIN XL) 300 MG 24 hr tablet Take 1 tablet (300 mg total) by mouth daily. 30 tablet 2   clonazePAM (KLONOPIN) 1 MG tablet TAKE ONE TABLET BY MOUTH TWICE DAILY 60 tablet 2   clopidogrel (PLAVIX) 75 MG tablet Take 75 mg by mouth daily.     ergocalciferol (VITAMIN D2) 1.25 MG (50000 UT) capsule Take 50,000 Units by mouth daily.     ezetimibe (ZETIA) 10 MG tablet Take 1 tablet (10 mg total) by mouth daily. 90 tablet 3   midodrine (PROAMATINE) 5 MG tablet Take 5 mg  in the morning  and take 2.5 mg ( 1/2 tablet ) in the evening.     mirtazapine (REMERON) 15  MG tablet Take 1 tablet (15 mg total) by mouth at bedtime. 30 tablet 2   rosuvastatin (CRESTOR) 20 MG tablet Take 20 mg by mouth daily.  5   Sennosides 15 MG TABS Take 15 mg by mouth daily. Takes 8 tablets a night     valbenazine (INGREZZA) 80 MG capsule Take 1 capsule (80 mg total) by mouth daily. 30 capsule 2   No current facility-administered medications for this visit.     Subjective:  Patient presents on time for today's session.  She shared recent events, progress since last visit.  She stated that she has been more effective at work, less anxiety and feeling more confident in her ability to achieve her work duties and tasks.   She stated she had a recent review with her manager which went well overall and received some constructive criticism which she felt was helpful.  She continues to struggle at times getting to work on time.  Collaboratively, explored in detail barriers in the morning where she often will hit the "snooze button" which then can cause her to be late for work significantly.  She states she sets to alarms.  She plans to follow through by utilizing thought stopping as discussed in session and engaging in "start behaviors" to begin her day as opposed to procrastinating. She continues to cope with some financial stress, plans to move into her new apartment later this month as her previous roommate had to move out.  She stated this will be challenging financially due to her overall increased rent.  She identified wanting to improve self-care by engaging in some exercise and plans to begin in the next few weeks.  Interventions: CBT, supportive therapy, motivational interviewing  Diagnoses:    ICD-10-CM   1. Generalized anxiety disorder  F41.1           Plan: Patient is to use CBT, mindfulness and coping skills to help manage decrease symptoms associated with their diagnosis.   Long-term goals:   Maintain symptom reduction: The patient will report sustained reduction in symptoms of anxiety using both CBT and mindfulness interventions for 3 consecutive months progressively.  Improve emotional regulation: The patient will learn and apply CBT and mindfulness-based strategies to regulate emotions, such as mindfulness-based stress reduction and cognitive restructuring, and report an improvement in emotional regulation for at least 3 consecutive months progressively.  Short-term goal:  The patient will learn and apply CBT and mindfulness-based coping skills for managing anxiety and practice using it between sessions.       2.   Increase awareness of automatic thoughts: The patient will learn to identify  automatic thoughts and increase awareness of their impact on emotions and behaviors through both CBT and mindfulness-based interventions.       3.    Identify, challenge, and replace fearful/anxious/depressive self talk with positive, realistic, and empowering self talk that does not feed anxiety nor depression.      Anson Oregon, [facility]

## 2022-03-24 ENCOUNTER — Telehealth: Payer: Self-pay | Admitting: Cardiology

## 2022-03-24 NOTE — Telephone Encounter (Signed)
*  STAT* If patient is at the pharmacy, call can be transferred to refill team.   1. Which medications need to be refilled? (please list name of each medication and dose if known)  midodrine (PROAMATINE) 5 MG tablet    2. Which pharmacy/location (including street and city if local pharmacy) is medication to be sent to? [facility], [address]   3. Do they need a 30 day or 90 day supply? 30 day

## 2022-03-25 MED ORDER — MIDODRINE HCL 5 MG PO TABS: ORAL_TABLET | ORAL | 3 refills | Status: DC

## 2022-03-26 DIAGNOSIS — R42 Dizziness and giddiness: Secondary | ICD-10-CM | POA: Diagnosis not present

## 2022-03-26 DIAGNOSIS — J029 Acute pharyngitis, unspecified: Secondary | ICD-10-CM | POA: Diagnosis not present

## 2022-03-26 DIAGNOSIS — R0981 Nasal congestion: Secondary | ICD-10-CM | POA: Diagnosis not present

## 2022-03-26 DIAGNOSIS — R5383 Other fatigue: Secondary | ICD-10-CM | POA: Diagnosis not present

## 2022-03-26 DIAGNOSIS — J020 Streptococcal pharyngitis: Secondary | ICD-10-CM | POA: Diagnosis not present

## 2022-03-26 DIAGNOSIS — Z1152 Encounter for screening for COVID-19: Secondary | ICD-10-CM | POA: Diagnosis not present

## 2022-03-26 DIAGNOSIS — R059 Cough, unspecified: Secondary | ICD-10-CM | POA: Diagnosis not present

## 2022-03-26 DIAGNOSIS — I10 Essential (primary) hypertension: Secondary | ICD-10-CM | POA: Diagnosis not present

## 2022-03-28 ENCOUNTER — Ambulatory Visit: Payer: BC Managed Care – PPO | Admitting: Adult Health

## 2022-04-10 ENCOUNTER — Ambulatory Visit: Payer: BC Managed Care – PPO | Admitting: Mental Health

## 2022-04-11 ENCOUNTER — Encounter: Payer: Self-pay | Admitting: Adult Health

## 2022-04-11 ENCOUNTER — Ambulatory Visit: Payer: BC Managed Care – PPO | Admitting: Adult Health

## 2022-04-11 DIAGNOSIS — F411 Generalized anxiety disorder: Secondary | ICD-10-CM | POA: Diagnosis not present

## 2022-04-11 DIAGNOSIS — G2401 Drug induced subacute dyskinesia: Secondary | ICD-10-CM

## 2022-04-11 DIAGNOSIS — F331 Major depressive disorder, recurrent, moderate: Secondary | ICD-10-CM | POA: Diagnosis not present

## 2022-04-11 DIAGNOSIS — G2571 Drug induced akathisia: Secondary | ICD-10-CM

## 2022-04-11 DIAGNOSIS — F410 Panic disorder [episodic paroxysmal anxiety] without agoraphobia: Secondary | ICD-10-CM

## 2022-04-11 NOTE — Progress Notes (Signed)
Heather Lucas [id number] 04/12/1960 62 y.o.  Subjective:   Patient ID:  Heather Lucas is a 62 y.o. (DOB [birth year]) female.  Chief Complaint: No chief complaint on file.   HPI Heather Lucas presents to the office today for follow-up of MDD, GAD, panic attacks and TD.  Describes mood today as "ok". Pleasant. Denies tearfulness. Mood symptoms - reports anxiety "ebbs and flows". Reports some depression - better overall. Denies irritability. Reports some worry, rumination, and over thinking - worried about finances - but everything has worked out so far. Mood is variable. Stating "I not doing as well as I was". Feels like medications are helpful. Stable interest and motivation. Taking medications as prescribed.  Energy levels lower.  Active, does not have a regular exercise routine - has ben trying to go to the gym in the afternoons.  Enjoys some usual interests and activities. Single. Lives alone. Brother in [place]. Friends local. Attends church. Appetite adequate. Weight loss - 131 pounds. Sleeps well most nights. Averages 10 hours. State "I feel too sleepy". Focus and concentration improved. Completing tasks. Managing aspects of household. 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  Denies AH or VH. Denies self harm. Denies substance use.   Previous medication trials:  Zoloft, Prozac, Wellbutrin, Lexapro, Trintellix, Hydroxyzine.   [place] Office Visit from 12/13/2021 in [place]  AIMS Total Score 28        Review of Systems:  Review of Systems  Musculoskeletal:  Negative for gait problem.  Neurological:  Negative for tremors.  Psychiatric/Behavioral:         Please refer to HPI    Medications: I have reviewed the patient's current medications.  Current Outpatient Medications  Medication Sig Dispense Refill   aspirin 81 MG EC tablet Take 81 mg by mouth daily.     buPROPion (WELLBUTRIN XL) 300 MG 24 hr tablet Take  1 tablet (300 mg total) by mouth daily. 30 tablet 2   clonazePAM (KLONOPIN) 1 MG tablet TAKE ONE TABLET BY MOUTH TWICE DAILY 60 tablet 2   clopidogrel (PLAVIX) 75 MG tablet Take 75 mg by mouth daily.     ergocalciferol (VITAMIN D2) 1.25 MG (50000 UT) capsule Take 50,000 Units by mouth daily.     ezetimibe (ZETIA) 10 MG tablet Take 1 tablet (10 mg total) by mouth daily. 90 tablet 3   midodrine (PROAMATINE) 5 MG tablet Take 5 mg  in the morning  and take 2.5 mg ( 1/2 tablet ) in the evening. 45 tablet 3   mirtazapine (REMERON) 15 MG tablet Take 1 tablet (15 mg total) by mouth at bedtime. 30 tablet 2   rosuvastatin (CRESTOR) 20 MG tablet Take 20 mg by mouth daily.  5   Sennosides 15 MG TABS Take 15 mg by mouth daily. Takes 8 tablets a night     valbenazine (INGREZZA) 80 MG capsule Take 1 capsule (80 mg total) by mouth daily. 30 capsule 2   No current facility-administered medications for this visit.    Medication Side Effects: None  Allergies: No Known Allergies  Past Medical History:  Diagnosis Date   Agatston coronary artery calcium score between 100 and 199 08/26/2017   Borderline hyperlipidemia    as of 10/2016: TC 134, TG 90, HDL 51, LDL 95 (PCP recently increase rosuvastatin dose from 10 to 20 mg)   Borderline hypertension    Coronary artery disease involving native coronary artery of native heart  without angina pectoris 02/18/2020   Family history of premature coronary artery disease 08/26/2017   Hx of CABG x 4 06/17/2021   Hyperlipidemia due to dietary fat intake 08/26/2017   Ischemic cardiomyopathy 06/17/2021    Past Medical History, Surgical history, Social history, and Family history were reviewed and updated as appropriate.   Please see review of systems for further details on the patient's review from today.   Objective:   Physical Exam:  There were no vitals taken for this visit.  Physical Exam Constitutional:      General: She is not in acute distress. Musculoskeletal:         General: No deformity.  Neurological:     Mental Status: She is alert and oriented to person, place, and time.     Coordination: Coordination normal.  Psychiatric:        Attention and Perception: Attention and perception normal. She does not perceive auditory or visual hallucinations.        Mood and Affect: Mood normal. Mood is not anxious or depressed. Affect is not labile, blunt, angry or inappropriate.        Speech: Speech normal.        Behavior: Behavior normal.        Thought Content: Thought content normal. Thought content is not paranoid or delusional. Thought content does not include homicidal or suicidal ideation. Thought content does not include homicidal or suicidal plan.        Cognition and Memory: Cognition and memory normal.        Judgment: Judgment normal.     Comments: Insight intact     Lab Review:     Component Value Date/Time   NA 141 10/16/2017 1412   K 4.6 10/16/2017 1412   CL 102 10/16/2017 1412   CO2 25 10/16/2017 1412   GLUCOSE 86 10/16/2017 1412   BUN 14 10/16/2017 1412   CREATININE 0.79 10/16/2017 1412   CALCIUM 9.0 10/16/2017 1412   PROT 6.0 03/03/2022 0833   ALBUMIN 4.1 03/03/2022 0833   AST 22 03/03/2022 0833   ALT 18 03/03/2022 0833   ALKPHOS 92 03/03/2022 0833   BILITOT 0.5 03/03/2022 0833   GFRNONAA 83 10/16/2017 1412   GFRAA 95 10/16/2017 1412    No results found for: "WBC", "RBC", "HGB", "HCT", "PLT", "MCV", "MCH", "MCHC", "RDW", "LYMPHSABS", "MONOABS", "EOSABS", "BASOSABS"  No results found for: "POCLITH", "LITHIUM"   No results found for: "PHENYTOIN", "PHENOBARB", "VALPROATE", "CBMZ"   .res Assessment: Plan:    Plan:  PDMP reviewed  Clonazepam '1mg'$  daily - takes at bedtime. Decrease Remeron '15mg'$  to 7.'5mg'$  at hs  Wellbutrin XL '300mg'$  every morning - will plan to increase dose next visit.  Ingrezza '80mg'$  daily - TD less - but still visible - legs and mouth.   Time spent with patient was 25 minutes. Greater than 50% of  face to face time with patient was spent on counseling and coordination of care.    RTC 2 weeks.  Patient advised to contact office with any questions, adverse effects, or acute worsening in signs and symptoms.   Discussed potential benefits, risk, and side effects of benzodiazepines to include potential risk of tolerance and dependence, as well as possible drowsiness.  Advised patient not to drive if experiencing drowsiness and to take lowest possible effective dose to minimize risk of dependence and tolerance.   Discussed potential metabolic side effects associated with atypical antipsychotics, as well as potential risk for movement side effects. Advised  pt to contact office if movement side effects occur There are no diagnoses linked to this encounter.   Please see After Visit Summary for patient specific instructions.  Future Appointments  Date Time Provider [place]  05/08/2022  2:00 PM Anson Oregon, [facility] None  07/02/2022  4:20 PM Leonie Man, MD [facility] None    No orders of the defined types were placed in this encounter.   -------------------------------

## 2022-04-12 ENCOUNTER — Other Ambulatory Visit: Payer: Self-pay | Admitting: Adult Health

## 2022-04-12 DIAGNOSIS — F331 Major depressive disorder, recurrent, moderate: Secondary | ICD-10-CM

## 2022-04-17 ENCOUNTER — Telehealth: Payer: Self-pay | Admitting: Adult Health

## 2022-04-17 NOTE — Telephone Encounter (Signed)
We can try leaving the Remeron off and see if that helps.

## 2022-04-17 NOTE — Telephone Encounter (Signed)
Pt LVM @ 10:24a.  She said she is having trouble with sleepiness.  She was late to work on Tuesday and she missed work today due to being to sleeping to go.  Pls call her back to discuss changes to meds.  Next appt 3/20

## 2022-04-17 NOTE — Telephone Encounter (Signed)
Patient advised of recommendation.

## 2022-04-17 NOTE — Telephone Encounter (Signed)
Patient said she is still too sleepy, missed 1/2 day of work Tuesday and all day today. She did reduce the Remeron from 15 to 7.5 and that hasn't made a difference in her sleepiness.

## 2022-04-21 ENCOUNTER — Telehealth: Payer: Self-pay | Admitting: Adult Health

## 2022-04-21 NOTE — Telephone Encounter (Signed)
Pt LVM @ 2:32p.  She said she stopped the generic Remeron.  Now she can't sleep because of jaw movement.  She wants to know if there is anything else she can take that is not so strong.  Next appt 3/20

## 2022-04-21 NOTE — Telephone Encounter (Signed)
Drowsiness is improved, but still present. Now she is finding it hard to sleep. She says the tongue movement is worse, previously was just her jaw. I asked her if she noticed it more at night because she wasn't sleeping or it was really worse and she can't say.

## 2022-04-21 NOTE — Telephone Encounter (Signed)
Mirtazapine was stopped last week because she was missing work from being too groggy. Now TD is keeping her from sleeping.  She is on 80 mg of Ingrezza.

## 2022-04-21 NOTE — Telephone Encounter (Signed)
So, the drowsiness is gone, but jaw movements have increased?

## 2022-04-22 ENCOUNTER — Other Ambulatory Visit: Payer: Self-pay | Admitting: Adult Health

## 2022-04-22 DIAGNOSIS — G4700 Insomnia, unspecified: Secondary | ICD-10-CM

## 2022-04-22 MED ORDER — HYDROXYZINE HCL 25 MG PO TABS: ORAL_TABLET | ORAL | 2 refills | Status: DC

## 2022-04-22 NOTE — Telephone Encounter (Signed)
I sent in Hydroxyzine to see if that will help with sleep.

## 2022-04-22 NOTE — Telephone Encounter (Signed)
Patient notified

## 2022-04-24 ENCOUNTER — Telehealth: Payer: Self-pay | Admitting: Adult Health

## 2022-04-24 NOTE — Telephone Encounter (Signed)
Patient lvm stating her nervousness and anxiety is really bad. She stated maybe her Wellbutrin dose is too high. Please advise.  Contact # [phone]

## 2022-04-25 NOTE — Telephone Encounter (Addendum)
Mailbox is full.

## 2022-04-28 NOTE — Telephone Encounter (Signed)
Noted. Ty.

## 2022-04-28 NOTE — Telephone Encounter (Signed)
FYI:  Called patient and she said she sees you on Wednesday and this can wait until then.

## 2022-04-30 ENCOUNTER — Ambulatory Visit (INDEPENDENT_AMBULATORY_CARE_PROVIDER_SITE_OTHER): Payer: BC Managed Care – PPO | Admitting: Adult Health

## 2022-04-30 ENCOUNTER — Encounter: Payer: Self-pay | Admitting: Adult Health

## 2022-04-30 ENCOUNTER — Other Ambulatory Visit (HOSPITAL_COMMUNITY): Payer: Self-pay

## 2022-04-30 DIAGNOSIS — G4700 Insomnia, unspecified: Secondary | ICD-10-CM

## 2022-04-30 DIAGNOSIS — F331 Major depressive disorder, recurrent, moderate: Secondary | ICD-10-CM | POA: Diagnosis not present

## 2022-04-30 DIAGNOSIS — F411 Generalized anxiety disorder: Secondary | ICD-10-CM | POA: Diagnosis not present

## 2022-04-30 DIAGNOSIS — G2401 Drug induced subacute dyskinesia: Secondary | ICD-10-CM | POA: Diagnosis not present

## 2022-04-30 MED ORDER — CLONAZEPAM 1 MG PO TABS: ORAL_TABLET | ORAL | 2 refills | Status: DC

## 2022-04-30 MED ORDER — VALBENAZINE TOSYLATE 80 MG PO CAPS
80.0000 mg | ORAL_CAPSULE | Freq: Every day | ORAL | 2 refills | Status: DC
  Filled 2022-04-30: qty 30, 30d supply, fill #0

## 2022-04-30 MED ORDER — BUPROPION HCL ER (XL) 300 MG PO TB24: 300.0000 mg | ORAL_TABLET | Freq: Every day | ORAL | 5 refills | Status: DC

## 2022-04-30 MED ORDER — BUSPIRONE HCL 10 MG PO TABS: ORAL_TABLET | ORAL | 2 refills | Status: DC

## 2022-04-30 NOTE — Progress Notes (Signed)
Heather Lucas [id number] 02/20/1960 62 y.o.  Subjective:   Patient ID:  Heather Lucas is a 62 y.o. (DOB [birth year]) female.  Chief Complaint: No chief complaint on file.   HPI Heather Lucas presents to the office today for follow-up of MDD, GAD, panic attacks and TD.  Describes mood today as "ok". Pleasant. Denies tearfulness. Mood symptoms - reports increased anxiety. Reports depression "maybe a little". Denies irritability. Reports some worry, rumination, and over thinking. Mood is consistent. Stating "I not doing as well as I was - the anxiety is making things difficult". Feels like medications are helpful. Willing to consider other options for anxiety. Stable interest and motivation. Taking medications as prescribed.  Energy levels lower.  Active, does not have a regular exercise routine. Enjoys some usual interests and activities. Single. Lives alone. Brother in [place]. Friends local. Attends church. Appetite adequate. Weight stable - 131 pounds. Sleeps well most nights. Averages 10 hours. Taking Melatonin.  Focus and concentration difficulties - more so in the work setting. Completing tasks. Managing aspects of household. 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  Denies AH or VH. Denies self harm. Denies substance use.  Previous medication trials:  Zoloft, Prozac, Wellbutrin, Lexapro, Trintellix, Hydroxyzine.   [place] Office Visit from 12/13/2021 in [place]  AIMS Total Score 28        Review of Systems:  Review of Systems  Musculoskeletal:  Negative for gait problem.  Neurological:  Negative for tremors.  Psychiatric/Behavioral:         Please refer to HPI    Medications: I have reviewed the patient's current medications.  Current Outpatient Medications  Medication Sig Dispense Refill   aspirin 81 MG EC tablet Take 81 mg by mouth daily.     buPROPion (WELLBUTRIN XL) 300 MG 24 hr tablet Take 1 tablet  (300 mg total) by mouth daily. 30 tablet 0   clonazePAM (KLONOPIN) 1 MG tablet TAKE ONE TABLET BY MOUTH TWICE DAILY 60 tablet 2   clopidogrel (PLAVIX) 75 MG tablet Take 75 mg by mouth daily.     ergocalciferol (VITAMIN D2) 1.25 MG (50000 UT) capsule Take 50,000 Units by mouth daily.     ezetimibe (ZETIA) 10 MG tablet Take 1 tablet (10 mg total) by mouth daily. 90 tablet 3   hydrOXYzine (ATARAX) 25 MG tablet Take one to two tablets at bedtime for sleep. 60 tablet 2   midodrine (PROAMATINE) 5 MG tablet Take 5 mg  in the morning  and take 2.5 mg ( 1/2 tablet ) in the evening. 45 tablet 3   rosuvastatin (CRESTOR) 20 MG tablet Take 20 mg by mouth daily.  5   Sennosides 15 MG TABS Take 15 mg by mouth daily. Takes 8 tablets a night     valbenazine (INGREZZA) 80 MG capsule Take 1 capsule (80 mg total) by mouth daily. 30 capsule 2   No current facility-administered medications for this visit.    Medication Side Effects: None  Allergies: No Known Allergies  Past Medical History:  Diagnosis Date   Agatston coronary artery calcium score between 100 and 199 08/26/2017   Borderline hyperlipidemia    as of 10/2016: TC 134, TG 90, HDL 51, LDL 95 (PCP recently increase rosuvastatin dose from 10 to 20 mg)   Borderline hypertension    Coronary artery disease involving native coronary artery of native heart without angina pectoris 02/18/2020   Family history of premature coronary  artery disease 08/26/2017   Hx of CABG x 4 06/17/2021   Hyperlipidemia due to dietary fat intake 08/26/2017   Ischemic cardiomyopathy 06/17/2021    Past Medical History, Surgical history, Social history, and Family history were reviewed and updated as appropriate.   Please see review of systems for further details on the patient's review from today.   Objective:   Physical Exam:  There were no vitals taken for this visit.  Physical Exam Constitutional:      General: She is not in acute distress. Musculoskeletal:         General: No deformity.  Neurological:     Mental Status: She is alert and oriented to person, place, and time.     Coordination: Coordination normal.  Psychiatric:        Attention and Perception: Attention and perception normal. She does not perceive auditory or visual hallucinations.        Mood and Affect: Mood normal. Mood is not anxious or depressed. Affect is not labile, blunt, angry or inappropriate.        Speech: Speech normal.        Behavior: Behavior normal.        Thought Content: Thought content normal. Thought content is not paranoid or delusional. Thought content does not include homicidal or suicidal ideation. Thought content does not include homicidal or suicidal plan.        Cognition and Memory: Cognition and memory normal.        Judgment: Judgment normal.     Comments: Insight intact     Lab Review:     Component Value Date/Time   NA 141 10/16/2017 1412   K 4.6 10/16/2017 1412   CL 102 10/16/2017 1412   CO2 25 10/16/2017 1412   GLUCOSE 86 10/16/2017 1412   BUN 14 10/16/2017 1412   CREATININE 0.79 10/16/2017 1412   CALCIUM 9.0 10/16/2017 1412   PROT 6.0 03/03/2022 0833   ALBUMIN 4.1 03/03/2022 0833   AST 22 03/03/2022 0833   ALT 18 03/03/2022 0833   ALKPHOS 92 03/03/2022 0833   BILITOT 0.5 03/03/2022 0833   GFRNONAA 83 10/16/2017 1412   GFRAA 95 10/16/2017 1412    No results found for: "WBC", "RBC", "HGB", "HCT", "PLT", "MCV", "MCH", "MCHC", "RDW", "LYMPHSABS", "MONOABS", "EOSABS", "BASOSABS"  No results found for: "POCLITH", "LITHIUM"   No results found for: "PHENYTOIN", "PHENOBARB", "VALPROATE", "CBMZ"   .res Assessment: Plan:    Plan:  PDMP reviewed  Clonazepam 1mg  daily - takes at bedtime. Wellbutrin XL 300mg  every morning - will plan to increase dose next visit. Ingrezza 80mg  daily - TD less - but still visible - legs and mouth.   Decrease Remeron 15mg  to 7.5mg  at hs  Add Buspar 10mg  TID - 1/2 TID x 7, then one TID  Time spent with  patient was 25 minutes. Greater than 50% of face to face time with patient was spent on counseling and coordination of care.    RTC 2 weeks.  Patient advised to contact office with any questions, adverse effects, or acute worsening in signs and symptoms.   Discussed potential benefits, risk, and side effects of benzodiazepines to include potential risk of tolerance and dependence, as well as possible drowsiness.  Advised patient not to drive if experiencing drowsiness and to take lowest possible effective dose to minimize risk of dependence and tolerance.   Discussed potential metabolic side effects associated with atypical antipsychotics, as well as potential risk for movement side effects.  Advised pt to contact office if movement side effects occur  There are no diagnoses linked to this encounter.   Please see After Visit Summary for patient specific instructions.  Future Appointments  Date Time Provider [place]  04/30/2022  5:00 PM Malena Timpone, Berdie Ogren, NP [facility] None  05/08/2022  2:00 PM Anson Oregon, [facility] None  07/02/2022  4:20 PM Leonie Man, MD [facility] None    No orders of the defined types were placed in this encounter.   -------------------------------

## 2022-05-01 ENCOUNTER — Other Ambulatory Visit (HOSPITAL_COMMUNITY): Payer: Self-pay

## 2022-05-02 ENCOUNTER — Other Ambulatory Visit (HOSPITAL_COMMUNITY): Payer: Self-pay

## 2022-05-05 ENCOUNTER — Other Ambulatory Visit (HOSPITAL_COMMUNITY): Payer: Self-pay

## 2022-05-08 ENCOUNTER — Ambulatory Visit: Payer: BC Managed Care – PPO | Admitting: Mental Health

## 2022-05-14 ENCOUNTER — Telehealth: Payer: Self-pay | Admitting: Adult Health

## 2022-05-14 ENCOUNTER — Other Ambulatory Visit: Payer: Self-pay

## 2022-05-14 ENCOUNTER — Other Ambulatory Visit (HOSPITAL_COMMUNITY): Payer: Self-pay

## 2022-05-14 MED ORDER — VALBENAZINE TOSYLATE 40 MG PO CAPS
40.0000 mg | ORAL_CAPSULE | Freq: Every day | ORAL | 0 refills | Status: DC
  Filled 2022-05-14: qty 30, 30d supply, fill #0

## 2022-05-14 NOTE — Telephone Encounter (Signed)
Patient is taking:  Wellbutrin 300 mg qd Buspirone 10 mg TID Clonazepam 1 mg - Rx is for BID, but she said she had only been taking once a day. If you want her to take 1/2 tab BID for the extra right now she said she has plenty. Ingrezza 80 mg   She said you prescribed hydroxyzine but she isn't taking it. According to history in Epic you discontinued it at last visit.

## 2022-05-14 NOTE — Telephone Encounter (Signed)
Patient notified of recommendations. Rx for Ingrezza 40 mg sent to [facility].

## 2022-05-14 NOTE — Telephone Encounter (Signed)
Let's have add the Clonazepam 1/2 bid with the 1mg  and reduce the Ingrezza back to 40mg  daily.

## 2022-05-14 NOTE — Telephone Encounter (Signed)
Heather Lucas called at 9:30 to report that the Buspirone is not working at all.  She is not able to work today.  She needs to be able to work.  She has appt 4/8 but doesn't want to wait until the appt to get a medication that will help her.  Please call in something else.  Then she can report back on Monday how she is doing.  Call to [facility].

## 2022-05-14 NOTE — Telephone Encounter (Signed)
Patient said the buspirone is not working. It was prescribed 3/20, so has only been 2 weeks. I told her that it usually takes longer to see benefit. She is in a near panic and saying it isn't helping at all. She said it feels like her skin is crawling and it is all day.

## 2022-05-15 ENCOUNTER — Other Ambulatory Visit (HOSPITAL_COMMUNITY): Payer: Self-pay

## 2022-05-19 ENCOUNTER — Ambulatory Visit: Payer: BC Managed Care – PPO | Admitting: Adult Health

## 2022-05-19 ENCOUNTER — Encounter: Payer: Self-pay | Admitting: Adult Health

## 2022-05-19 DIAGNOSIS — F411 Generalized anxiety disorder: Secondary | ICD-10-CM | POA: Diagnosis not present

## 2022-05-19 DIAGNOSIS — F410 Panic disorder [episodic paroxysmal anxiety] without agoraphobia: Secondary | ICD-10-CM

## 2022-05-19 DIAGNOSIS — F331 Major depressive disorder, recurrent, moderate: Secondary | ICD-10-CM

## 2022-05-19 DIAGNOSIS — G4700 Insomnia, unspecified: Secondary | ICD-10-CM | POA: Diagnosis not present

## 2022-05-19 DIAGNOSIS — G2401 Drug induced subacute dyskinesia: Secondary | ICD-10-CM | POA: Diagnosis not present

## 2022-05-19 MED ORDER — VALBENAZINE TOSYLATE 40 MG PO CAPS
40.0000 mg | ORAL_CAPSULE | Freq: Every day | ORAL | 5 refills | Status: DC
  Filled 2022-05-19 – 2022-06-08 (×2): qty 30, 30d supply, fill #0
  Filled 2022-07-09: qty 30, 30d supply, fill #1

## 2022-05-19 NOTE — Progress Notes (Signed)
Heather Lucas [id number] [birth year] 62 y.o.  Subjective:   Patient ID:  Heather Lucas is a 62 y.o. (DOB 08-Oct-1960) female.  Chief Complaint: No chief complaint on file.   HPI Heather Lucas presents to the office today for follow-up of  MDD, GAD, panic attacks and TD.  Describes mood today as "ok". Pleasant. Denies tearfulness. Mood symptoms - reports decreased anxiety since decreasing the Ingrezza from 80mg  to 40mg  daily. Denies depression -"I'm just not motivated". Denies irritability. Reports some worry, rumination, and over thinking. Mood is consistent. Stating "I'm doing better than I was". Feels like medications are helpful. Stable interest and motivation. Taking medications as prescribed.  Energy levels lower.  Active, does not have a regular exercise routine. Enjoys some usual interests and activities. Single. Lives alone. Brother in [place]. Friends local. Attends church. Appetite adequate. Weight loss - 125 from 131 pounds. Sleeps well most nights. Averages 10 hours. Taking Melatonin.  Focus and concentration is better, but not where she would like it to be. Completing tasks. Managing aspects of household. 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  Denies AH or VH. Denies self harm. Denies substance use.  Previous medication trials:  Zoloft, Prozac, Wellbutrin, Lexapro, Trintellix, Hydroxyzine.  AIMS    Flowsheet Row Office Visit from 12/13/2021 in [facility]  AIMS Total Score 28        Review of Systems:  Review of Systems  Musculoskeletal:  Negative for gait problem.  Neurological:  Negative for tremors.  Psychiatric/Behavioral:         Please refer to HPI    Medications: I have reviewed the patient's current medications.  Current Outpatient Medications  Medication Sig Dispense Refill   aspirin 81 MG EC tablet Take 81 mg by mouth daily.     buPROPion (WELLBUTRIN XL) 300 MG 24 hr tablet Take 1 tablet (300 mg total) by  mouth daily. 30 tablet 5   busPIRone (BUSPAR) 10 MG tablet Take 1/2 tablet three times daily for 7 days, then increase to one tablet three times daily. 90 tablet 2   clonazePAM (KLONOPIN) 1 MG tablet TAKE ONE TABLET BY MOUTH TWICE DAILY 60 tablet 2   clopidogrel (PLAVIX) 75 MG tablet Take 75 mg by mouth daily.     ergocalciferol (VITAMIN D2) 1.25 MG (50000 UT) capsule Take 50,000 Units by mouth daily.     ezetimibe (ZETIA) 10 MG tablet Take 1 tablet (10 mg total) by mouth daily. 90 tablet 3   midodrine (PROAMATINE) 5 MG tablet Take 5 mg  in the morning  and take 2.5 mg ( 1/2 tablet ) in the evening. 45 tablet 3   rosuvastatin (CRESTOR) 20 MG tablet Take 20 mg by mouth daily.  5   Sennosides 15 MG TABS Take 15 mg by mouth daily. Takes 8 tablets a night     valbenazine (INGREZZA) 40 MG capsule Take 1 capsule (40 mg total) by mouth daily. 30 capsule 0   No current facility-administered medications for this visit.    Medication Side Effects: None  Allergies: No Known Allergies  Past Medical History:  Diagnosis Date   Agatston coronary artery calcium score between 100 and 199 08/26/2017   Borderline hyperlipidemia    as of 10/2016: TC 134, TG 90, HDL 51, LDL 95 (PCP recently increase rosuvastatin dose from 10 to 20 mg)   Borderline hypertension    Coronary artery disease involving native coronary artery of native heart without angina pectoris 02/18/2020  Family history of premature coronary artery disease 08/26/2017   Hx of CABG x 4 06/17/2021   Hyperlipidemia due to dietary fat intake 08/26/2017   Ischemic cardiomyopathy 06/17/2021    Past Medical History, Surgical history, Social history, and Family history were reviewed and updated as appropriate.   Please see review of systems for further details on the patient's review from today.   Objective:   Physical Exam:  There were no vitals taken for this visit.  Physical Exam Constitutional:      General: She is not in acute  distress. Musculoskeletal:        General: No deformity.  Neurological:     Mental Status: She is alert and oriented to person, place, and time.     Coordination: Coordination normal.  Psychiatric:        Attention and Perception: Attention and perception normal. She does not perceive auditory or visual hallucinations.        Mood and Affect: Mood normal. Mood is not anxious or depressed. Affect is not labile, blunt, angry or inappropriate.        Speech: Speech normal.        Behavior: Behavior normal.        Thought Content: Thought content normal. Thought content is not paranoid or delusional. Thought content does not include homicidal or suicidal ideation. Thought content does not include homicidal or suicidal plan.        Cognition and Memory: Cognition and memory normal.        Judgment: Judgment normal.     Comments: Insight intact     Lab Review:     Component Value Date/Time   NA 141 10/16/2017 1412   K 4.6 10/16/2017 1412   CL 102 10/16/2017 1412   CO2 25 10/16/2017 1412   GLUCOSE 86 10/16/2017 1412   BUN 14 10/16/2017 1412   CREATININE 0.79 10/16/2017 1412   CALCIUM 9.0 10/16/2017 1412   PROT 6.0 03/03/2022 0833   ALBUMIN 4.1 03/03/2022 0833   AST 22 03/03/2022 0833   ALT 18 03/03/2022 0833   ALKPHOS 92 03/03/2022 0833   BILITOT 0.5 03/03/2022 0833   GFRNONAA 83 10/16/2017 1412   GFRAA 95 10/16/2017 1412    No results found for: "WBC", "RBC", "HGB", "HCT", "PLT", "MCV", "MCH", "MCHC", "RDW", "LYMPHSABS", "MONOABS", "EOSABS", "BASOSABS"  No results found for: "POCLITH", "LITHIUM"   No results found for: "PHENYTOIN", "PHENOBARB", "VALPROATE", "CBMZ"   .res Assessment: Plan:    Plan:  PDMP reviewed  Clonazepam 1mg  daily and 0.5mg  during the day some days - takes at bedtime - will need and adjusted prescription. Buspar 10mg  daily as needed  Wellbutrin XL 300mg  every morning - will plan to increase dose next visit. Ingrezza 40mg  daily at bedtime.  Time  spent with patient was 25 minutes. Greater than 50% of face to face time with patient was spent on counseling and coordination of care.    RTC 2 weeks.  Patient advised to contact office with any questions, adverse effects, or acute worsening in signs and symptoms.   Discussed potential benefits, risk, and side effects of benzodiazepines to include potential risk of tolerance and dependence, as well as possible drowsiness.  Advised patient not to drive if experiencing drowsiness and to take lowest possible effective dose to minimize risk of dependence and tolerance.   Discussed potential metabolic side effects associated with atypical antipsychotics, as well as potential risk for movement side effects. Advised pt to contact office if movement  side effects occur  There are no diagnoses linked to this encounter.   Please see After Visit Summary for patient specific instructions.  Future Appointments  Date Time Provider Department Center  07/02/2022  4:20 PM Marykay LexHarding, David W, MD [facility] None    No orders of the defined types were placed in this encounter.   -------------------------------

## 2022-05-20 ENCOUNTER — Other Ambulatory Visit (HOSPITAL_COMMUNITY): Payer: Self-pay

## 2022-05-22 ENCOUNTER — Ambulatory Visit: Payer: BC Managed Care – PPO | Admitting: Mental Health

## 2022-05-22 ENCOUNTER — Other Ambulatory Visit (HOSPITAL_COMMUNITY): Payer: Self-pay

## 2022-05-22 DIAGNOSIS — F411 Generalized anxiety disorder: Secondary | ICD-10-CM

## 2022-05-22 NOTE — Progress Notes (Signed)
[facility]   Name: Heather Lucas Date: 05/21/2022 MRN: [id number] DOB: Jul 06, 1960 PCP: Cleatis Polka., MD   Time spent:  52 minutes   Treatment:  Ind. therapy   Mental Status Exam:         Appearance:    Casual      Behavior:   Appropriate   Motor:   WNL   Speech/Language:    Clear and Coherent   Affect:   Full range    Mood:   anxious   Thought process:   Logical, linear, goal directed   Thought content:     WNL   Sensory/Perceptual disturbances:     none   Orientation:   x4   Attention:   Good   Concentration:   Good   Memory:   Intact   Fund of knowledge:    Consistent with age and development   Insight:     Good   Judgment:    Good   Impulse Control:   Good       Reported Symptoms:  anxiety daily (10/10), depression (3/10)   Risk Assessment: Danger to Self:  No Self-injurious Behavior: No Danger to Others: No Duty to Warn:no Physical Aggression / Violence:No  Access to Firearms a concern: No  Gang Involvement:No  Patient / guardian was educated about steps to take if suicide or homicide risk level increases between visits: yes While future psychiatric events cannot be accurately predicted, the patient does not currently require acute inpatient psychiatric care and does not currently meet [facility] involuntary commitment criteria.   Medical History/Surgical History:     Past Medical History:  Diagnosis Date   Agatston coronary artery calcium score between 100 and 199 08/26/2017   Borderline hyperlipidemia      as of 10/2016: TC 134, TG 90, HDL 51, LDL 95 (PCP recently increase rosuvastatin dose from 10 to 20 mg)   Borderline hypertension     Coronary artery disease involving native coronary artery of native heart without angina pectoris 02/18/2020   Family history of premature coronary artery disease 08/26/2017   Hx of CABG x 4 06/17/2021   Hyperlipidemia due to dietary fat intake 08/26/2017   Ischemic cardiomyopathy 06/17/2021            Past Surgical History:  Procedure Laterality Date   CORONARY ARTERY BYPASS GRAFT   03/2020    [facility], [place], [place] -> reportedly CABG x4   LAPAROSCOPIC CHOLECYSTECTOMY   1995   LEFT HEART CATH AND CORONARY ANGIOGRAPHY   02/2020    [facility] [place], [place]) -> multivessel disease, referred for CABG   NM MYOVIEW LTD   02/2018    Clearly appears abnormal, referred for [facility], [place]   TRANSTHORACIC ECHOCARDIOGRAM   02/2020    Several echoes in January and February 2022-initially noted to be abnormal along with Myoview stress test, immediately post CABG recheck      Medications:       Current Outpatient Medications  Medication Sig Dispense Refill   aspirin 81 MG EC tablet Take 81 mg by mouth daily.       buPROPion (WELLBUTRIN XL) 300 MG 24 hr tablet Take 1 tablet (300 mg total) by mouth daily. 30 tablet 2   clonazePAM (KLONOPIN) 1 MG tablet TAKE ONE TABLET BY MOUTH TWICE DAILY 60 tablet 2   clopidogrel (PLAVIX) 75 MG tablet Take 75 mg by mouth daily.       ergocalciferol (VITAMIN D2) 1.25  MG (50000 UT) capsule Take 50,000 Units by mouth daily.       ezetimibe (ZETIA) 10 MG tablet Take 1 tablet (10 mg total) by mouth daily. 90 tablet 3   midodrine (PROAMATINE) 5 MG tablet Take 5 mg  in the morning  and take 2.5 mg ( 1/2 tablet ) in the evening.       mirtazapine (REMERON) 15 MG tablet Take 1 tablet (15 mg total) by mouth at bedtime. 30 tablet 2   rosuvastatin (CRESTOR) 20 MG tablet Take 20 mg by mouth daily.   5   Sennosides 15 MG TABS Take 15 mg by mouth daily. Takes 8 tablets a night       valbenazine (INGREZZA) 40 MG capsule Take 1 capsule (40 mg total) by mouth daily. 30 capsule 5   valbenazine (INGREZZA) 40 MG capsule Take 1 capsule (40 mg total) by mouth daily. 30 capsule 0    No current facility-administered medications for this visit.        Subjective:  Patient presents on time for today's session.  Assessed progress since last  visit which was about 3 months ago.  Patient stated she continues to have some self-doubt at work, is worried that she is not working fast enough.  She states she can get easily distracted, continues to cope with tardive dyskinesia symptoms although she takes medication she feels it has done little to manage the associated symptoms.  She stated that she continues to have chronic low energy, describes her mood as not moderately or severely depressed but also rarely happy or feel elated.  She stated she sleeps about 10 hours per night but often may wake a few times, she is unsure why she is tired so often.  We discussed talking with her family doctor, maybe evaluate for sleep apnea.  She stated she values time with her friends but rarely has energy to go spend time with him such as going for walks.  Provided support and encouragement for her to schedule some times to walk with friends as she stated that she is more consistent when she sets appointments with them.  Explored her morning routine where she may wake up, go back to bed once or twice after waking and then be late for work.  She is considering working on this routine and we explored collaboratively ways to try and keep consistent without laying back down.  Interventions: CBT, supportive therapy, motivational interviewing   Diagnoses:      ICD-10-CM    1. Major depressive disorder, recurrent episode, moderate (HCC)  F33.1                 Plan: Patient is to use CBT, mindfulness and coping skills to help manage decrease symptoms associated with their diagnosis.     Long-term goals:   Maintain symptom reduction: The patient will report sustained reduction in symptoms of anxiety using both CBT and mindfulness interventions for 3 consecutive months progressively.  Improve emotional regulation: The patient will learn and apply CBT and mindfulness-based strategies to regulate emotions, such as mindfulness-based stress reduction and cognitive  restructuring, and report an improvement in emotional regulation for at least 3 consecutive months progressively.   Short-term goal:  The patient will learn and apply CBT and mindfulness-based coping skills for managing anxiety and practice using it between sessions.       2.   Increase awareness of automatic thoughts: The patient will learn to identify automatic thoughts and increase awareness of  their impact on emotions and behaviors through both CBT and mindfulness-based interventions.       3.    Identify, challenge, and replace fearful/anxious/depressive self talk with positive, realistic, and empowering self talk that does not feed anxiety nor depression.         Waldron Session, [facility]

## 2022-06-09 ENCOUNTER — Other Ambulatory Visit (HOSPITAL_COMMUNITY): Payer: Self-pay

## 2022-06-10 ENCOUNTER — Other Ambulatory Visit: Payer: Self-pay

## 2022-06-10 ENCOUNTER — Other Ambulatory Visit (HOSPITAL_COMMUNITY): Payer: Self-pay

## 2022-06-12 ENCOUNTER — Ambulatory Visit: Payer: BC Managed Care – PPO | Admitting: Mental Health

## 2022-06-13 ENCOUNTER — Telehealth: Payer: Self-pay | Admitting: Adult Health

## 2022-06-13 NOTE — Telephone Encounter (Signed)
Pt called reporting taking generic wellbutrin 300 mg and continuously making her sick on stomach. She is down to 116 lbs  from 145 lbs. Needs advise on how to stop taking. She can't take any longer. Contact # [phone]  Apt 5/13

## 2022-06-13 NOTE — Telephone Encounter (Signed)
Has been on this dose since 12/23, no SE previously reported.

## 2022-06-13 NOTE — Telephone Encounter (Signed)
Patient c/o stomach upset with Wellbutrin XL 300. She has been on this dose since December it looks like, but said sx are recent. I called pharmacy to check and see if there had been a change in manufacturer and there had been. They can order from previous manufacturer and have in stock on Monday. She is due for a RF anyway. She said she has been drinking ginger ale to help settle her stomach, hasn't had an appetite.  Called patient and she is agreeable to staying on the medication with previous manufacturer. Will notify pharmacy.

## 2022-06-23 ENCOUNTER — Ambulatory Visit: Payer: BC Managed Care – PPO | Admitting: Adult Health

## 2022-06-23 ENCOUNTER — Encounter: Payer: Self-pay | Admitting: Adult Health

## 2022-06-23 DIAGNOSIS — F411 Generalized anxiety disorder: Secondary | ICD-10-CM

## 2022-06-23 DIAGNOSIS — G4700 Insomnia, unspecified: Secondary | ICD-10-CM

## 2022-06-23 DIAGNOSIS — F331 Major depressive disorder, recurrent, moderate: Secondary | ICD-10-CM | POA: Diagnosis not present

## 2022-06-23 DIAGNOSIS — F410 Panic disorder [episodic paroxysmal anxiety] without agoraphobia: Secondary | ICD-10-CM | POA: Diagnosis not present

## 2022-06-23 DIAGNOSIS — G2401 Drug induced subacute dyskinesia: Secondary | ICD-10-CM

## 2022-06-23 MED ORDER — BUPROPION HCL ER (XL) 150 MG PO TB24: ORAL_TABLET | ORAL | 0 refills | Status: DC

## 2022-06-23 NOTE — Progress Notes (Signed)
Heather Lucas [id number] [birth year] 62 y.o.  Subjective:   Patient ID:  Heather Lucas is a 62 y.o. (DOB [birth year]) female.  Chief Complaint: No chief complaint on file.   HPI Heather Lucas presents to the office today for follow-up of MDD, GAD, panic attacks and TD.  Describes mood today as "ok". Pleasant. Denies tearfulness. Mood symptoms - denies depression or sadness. Reports anxiety - "most of the time - maybe work related". Denies irritability. Reports "some" worry, rumination, and over thinking. Mood is consistent. Stating "I'm doing alright". Would like to discontinue Wellbutrin due to weight loss. Would also like to try leaving off the Ingrezza. Stable interest and motivation. Taking medications as prescribed.  Energy levels low. Active, does not have a regular exercise routine. Enjoys some usual interests and activities. Single. Lives alone. Brother in [place]. Friends local. Attends church. Appetite adequate. Weight loss - 114 from 125 pounds. Sleeps well most nights. Averages 10 hours. Taking Melatonin.  Focus and concentration "fair". Completing tasks. Managing aspects of household. 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  Denies AH or VH. Denies self harm. Denies substance use.  Previous medication trials:  Zoloft, Prozac, Wellbutrin, Lexapro, Trintellix, Hydroxyzine.   AIMS    Flowsheet Row Office Visit from 12/13/2021 in [facility]  AIMS Total Score 28        Review of Systems:  Review of Systems  Musculoskeletal:  Negative for gait problem.  Neurological:  Negative for tremors.  Psychiatric/Behavioral:         Please refer to HPI    Medications: I have reviewed the patient's current medications.  Current Outpatient Medications  Medication Sig Dispense Refill   aspirin 81 MG EC tablet Take 81 mg by mouth daily.     buPROPion (WELLBUTRIN XL) 300 MG 24 hr tablet Take 1 tablet (300 mg total) by mouth daily. 30  tablet 5   busPIRone (BUSPAR) 10 MG tablet Take 1/2 tablet three times daily for 7 days, then increase to one tablet three times daily. 90 tablet 2   clonazePAM (KLONOPIN) 1 MG tablet TAKE ONE TABLET BY MOUTH TWICE DAILY 60 tablet 2   clopidogrel (PLAVIX) 75 MG tablet Take 75 mg by mouth daily.     ergocalciferol (VITAMIN D2) 1.25 MG (50000 UT) capsule Take 50,000 Units by mouth daily.     ezetimibe (ZETIA) 10 MG tablet Take 1 tablet (10 mg total) by mouth daily. 90 tablet 3   midodrine (PROAMATINE) 5 MG tablet Take 5 mg  in the morning  and take 2.5 mg ( 1/2 tablet ) in the evening. 45 tablet 3   rosuvastatin (CRESTOR) 20 MG tablet Take 20 mg by mouth daily.  5   Sennosides 15 MG TABS Take 15 mg by mouth daily. Takes 8 tablets a night     valbenazine (INGREZZA) 40 MG capsule Take 1 capsule (40 mg total) by mouth daily. 30 capsule 5   No current facility-administered medications for this visit.    Medication Side Effects: None  Allergies: No Known Allergies  Past Medical History:  Diagnosis Date   Agatston coronary artery calcium score between 100 and 199 08/26/2017   Borderline hyperlipidemia    as of 10/2016: TC 134, TG 90, HDL 51, LDL 95 (PCP recently increase rosuvastatin dose from 10 to 20 mg)   Borderline hypertension    Coronary artery disease involving native coronary artery of native heart without angina pectoris 02/18/2020   Family history  of premature coronary artery disease 08/26/2017   Hx of CABG x 4 06/17/2021   Hyperlipidemia due to dietary fat intake 08/26/2017   Ischemic cardiomyopathy 06/17/2021    Past Medical History, Surgical history, Social history, and Family history were reviewed and updated as appropriate.   Please see review of systems for further details on the patient's review from today.   Objective:   Physical Exam:  There were no vitals taken for this visit.  Physical Exam Constitutional:      General: She is not in acute distress. Musculoskeletal:         General: No deformity.  Neurological:     Mental Status: She is alert and oriented to person, place, and time.     Coordination: Coordination normal.  Psychiatric:        Attention and Perception: Attention and perception normal. She does not perceive auditory or visual hallucinations.        Mood and Affect: Mood normal. Mood is not anxious or depressed. Affect is not labile, blunt, angry or inappropriate.        Speech: Speech normal.        Behavior: Behavior normal.        Thought Content: Thought content normal. Thought content is not paranoid or delusional. Thought content does not include homicidal or suicidal ideation. Thought content does not include homicidal or suicidal plan.        Cognition and Memory: Cognition and memory normal.        Judgment: Judgment normal.     Comments: Insight intact     Lab Review:     Component Value Date/Time   NA 141 10/16/2017 1412   K 4.6 10/16/2017 1412   CL 102 10/16/2017 1412   CO2 25 10/16/2017 1412   GLUCOSE 86 10/16/2017 1412   BUN 14 10/16/2017 1412   CREATININE 0.79 10/16/2017 1412   CALCIUM 9.0 10/16/2017 1412   PROT 6.0 03/03/2022 0833   ALBUMIN 4.1 03/03/2022 0833   AST 22 03/03/2022 0833   ALT 18 03/03/2022 0833   ALKPHOS 92 03/03/2022 0833   BILITOT 0.5 03/03/2022 0833   GFRNONAA 83 10/16/2017 1412   GFRAA 95 10/16/2017 1412    No results found for: "WBC", "RBC", "HGB", "HCT", "PLT", "MCV", "MCH", "MCHC", "RDW", "LYMPHSABS", "MONOABS", "EOSABS", "BASOSABS"  No results found for: "POCLITH", "LITHIUM"   No results found for: "PHENYTOIN", "PHENOBARB", "VALPROATE", "CBMZ"   .res Assessment: Plan:    Plan:  PDMP reviewed  Clonazepam 1mg  twice daily Buspar 10mg  three times daily  Decrease Wellbutrin XL 300mg  to 150mg  every morning for 7 days, then d/c. Will plan to d/c Ingrezza 40mg  daily at bedtime once tapered off of Wellbutrin.   Time spent with patient was 25 minutes. Greater than 50% of face to face  time with patient was spent on counseling and coordination of care.    RTC 2 weeks.  Patient advised to contact office with any questions, adverse effects, or acute worsening in signs and symptoms.   Discussed potential benefits, risk, and side effects of benzodiazepines to include potential risk of tolerance and dependence, as well as possible drowsiness.  Advised patient not to drive if experiencing drowsiness and to take lowest possible effective dose to minimize risk of dependence and tolerance.   Discussed potential metabolic side effects associated with atypical antipsychotics, as well as potential risk for movement side effects. Advised pt to contact office if movement side effects occur There are no diagnoses linked  to this encounter.   Please see After Visit Summary for patient specific instructions.  Future Appointments  Date Time Provider Department Center  06/23/2022  5:00 PM Aliciana Ricciardi, Thereasa Solo, NP [facility] None  07/02/2022  4:20 PM Marykay Lex, MD [facility] None  07/22/2022  3:00 PM Waldron Session, [facility] None    No orders of the defined types were placed in this encounter.   -------------------------------

## 2022-07-02 ENCOUNTER — Ambulatory Visit: Payer: BC Managed Care – PPO | Attending: Cardiology | Admitting: Cardiology

## 2022-07-02 ENCOUNTER — Encounter: Payer: Self-pay | Admitting: Cardiology

## 2022-07-02 VITALS — BP 112/64 | HR 77 | Wt 145.5 lb

## 2022-07-02 DIAGNOSIS — B370 Candidal stomatitis: Secondary | ICD-10-CM | POA: Diagnosis not present

## 2022-07-02 DIAGNOSIS — E7849 Other hyperlipidemia: Secondary | ICD-10-CM

## 2022-07-02 DIAGNOSIS — J029 Acute pharyngitis, unspecified: Secondary | ICD-10-CM | POA: Diagnosis not present

## 2022-07-02 DIAGNOSIS — Z951 Presence of aortocoronary bypass graft: Secondary | ICD-10-CM

## 2022-07-02 DIAGNOSIS — I2510 Atherosclerotic heart disease of native coronary artery without angina pectoris: Secondary | ICD-10-CM | POA: Diagnosis not present

## 2022-07-02 DIAGNOSIS — I9581 Postprocedural hypotension: Secondary | ICD-10-CM | POA: Diagnosis not present

## 2022-07-02 DIAGNOSIS — I10 Essential (primary) hypertension: Secondary | ICD-10-CM | POA: Diagnosis not present

## 2022-07-02 DIAGNOSIS — R0609 Other forms of dyspnea: Secondary | ICD-10-CM

## 2022-07-02 DIAGNOSIS — J020 Streptococcal pharyngitis: Secondary | ICD-10-CM | POA: Diagnosis not present

## 2022-07-02 NOTE — Progress Notes (Signed)
Primary Care Provider: Cleatis Polka., MD [facility] Cardiologist: Bryan Lemma, MD Electrophysiologist: None  Clinic Note: Chief Complaint  Patient presents with   Follow-up    Doing well.  Has strep throat today   Coronary Artery Disease    Over 2 years out from CABG.  Doing well.   Hyperlipidemia    Was started on Zetia during last visit now follow-up labs look good.    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  Just to avoid dizziness and wooziness, I think we will continue midodrine tissues doing well.  Continue to encourage adequate hydration.  The father she gets that we will try to wean her off midodrine.      Hyperlipidemia due to dietary fat intake (Chronic)    Labs from January look great.  LDL down to 49 after adding Zetia.  Continue current dose of rosuvastatin 20 mg daily and Zetia 10 mg.  Follow-up labs in January/February 2025.      Relevant Orders   Lipid panel   Comprehensive metabolic panel   Coronary artery disease involving native coronary artery of native heart without angina pectoris - Primary (Chronic)    Pretty rapid progression of disease from when I saw her back in 20 19-20 22.  Interestingly, this was all based on screening stress test with exertional dyspnea after 9 and half minutes.  She was not as symptomatic at that time.  She is now status post CABG x 4.  Feeling well with no history of exercise dyspnea. Echo shows well-preserved EF with no evidence of ischemic cardiomyopathy.  No evidence of infarct.  Plan: Stop aspirin, continue Plavix-hopefully this will help her bruising. She is not on the beta-blocker or ARB because of hypotension.  In fact she is actually on midodrine. => Currently at 2.5 mg twice daily pressures are still, low.  Continue for  now. She is now combination of Zetia and 20 mg Crestor.  Unable to tolerate higher dose of Crestor.  Lipids look great now. Post CABG, due for follow-up stress test in 2027       Relevant Orders   EKG 12-Lead (Completed)   Lipid panel   Comprehensive metabolic panel     Other   Hx of CABG x 4 (Chronic)    Preserved EF on echo.  Due for follow-up ischemic evaluation February 2027      Relevant Orders   EKG 12-Lead (Completed)   DOE (dyspnea on exertion) (Chronic)    This was the "anginal equivalent "that led to her stress test and eventually CABG.  Now that she is back in better shape, and really not noticing significant fatigue and dyspnea.      ===================================  HPI:    Heather Lucas is a 62 y.o. female with a PMH for MV CAD=> CABG x 4, HLD & HYPOTENSION (on midodrine) who presents today for for chronic follow-up..  Initial evaluation July 2019: Abnormal coronary calcium score (125, involving LM, LAD and RCA) and fatigue Coronary CTA: Calcium score 148.  Mild nonobstructive CAD.  Recommended aggressive resector modification.  She subsequently moved to [place] to live near her family and while she was evaluated with a routine Echo and Stress Test that were read as abnormal.  => Cath revealed severe CAD, and reduced EF => CABG x 4 03/19/2020.  (  LIMA-LAD, SVG-OM1, SVG-OM2, SVG-dRCA) Echo  07/2021:  EF 65-70%. No RWMA, beyond postop septal dyssynergy. Normal Valves.   After moving back to [place], she was seen again here on 06/17/2021 and was stable from a cardiac standpoint though she did note stable chronic exertional dyspnea and chronic fatigue.  => We ordered a 2D echo simply to reassess EF.  Reviewed below.  Nickcole Blasberg was last seen on January 01, 2022 by Bernadene Person, NP for follow-up.  She is doing well from a cardiac standpoint.  No angina symptoms.  No exertional dyspnea.  BP stable. => Started Zetia for LDL of 77.  Continue Crestor.  Recent  Hospitalizations: n/a  Reviewed  CV studies:    The following studies were reviewed today: (if available, images/films reviewed: From Epic Chart or Care Everywhere) Echo June 2023: EF 65 to 70%.  No RWMA other than possible septal motion.  Mild MAC.  Aortic sclerosis.  Otherwise normal.  [facility]: [facility]: January-February 2022 (In Media Section of Epic 07/25/2021)  Stress Test 02/23/2020: 9:30 min (Stage 3). 10.8 METS 80% MPHR; NO CP but + DOE. -> 1-2 mm Inf De[pression & AVR STE = c/w Ischemia Echo 02/16/2020: EF 60% with mild MR and TR. Cardiac Catheterization, Dr. Adin Hector 03/01/2020-> multivessel CAD => 50% pLAD & 60% mLAD, 70% pLCx & 90% mLCx,  70% mid RCA CABG x 4 03/19/2020 (Dr. Harlow Mares): LIMA-LAD, SVG-OM1, SVG-OM2, SVG-dRCA Limited Echo 03/26/2020: Post CABG: EF 55-60%.  Normal wall motion.  Normal RV.  Mild RA dilation.  No pericardial effusion.  Left pleural effusion noted.  Interval History:   Heather Lucas returns here today for 70-month follow-up doing pretty well except that she is dealing with strep throat-just diagnosed today.  She has no cardiac complaints of chest pain or pressure with rest or exertion.  No PND orthopnea edema.  No palpitations.  She has some bruising but no bleeding.  She remains active try to get some exercise but is doing a lot of chores around the house as well.  She denies any exertional dyspnea that was consistent with her "anginal equivalent ".  Stable from cardiac standpoint:. CV Review of Symptoms (Summary): no chest pain or dyspnea on exertion negative for - edema, irregular heartbeat, orthopnea, palpitations, paroxysmal nocturnal dyspnea, rapid heart rate, shortness of breath, or lightheadedness, dizziness wooziness, syncope/near syncope TIA/amaurosis fugax, claudication  REVIEWED OF SYSTEMS   Review of Systems  Constitutional:  Negative for malaise/fatigue and weight loss.  HENT:  Negative for congestion  and nosebleeds.   Gastrointestinal:  Positive for constipation.  Musculoskeletal:        Mild aches and pains but nothing significant  Neurological:  Positive for dizziness (Pretty stable on the midodrine that she is taking half tablet i (2.5 mg) twice daily). Negative for focal weakness and loss of consciousness.  Psychiatric/Behavioral:  Negative for depression (Controlled) and memory loss. The patient is not nervous/anxious (Overall pretty well-controlled.) and does not have insomnia.   All other systems reviewed and are negative.  I have reviewed and (if needed) personally updated the patient's problem list, medications, allergies, past medical and surgical history, social and family history.   PAST MEDICAL HISTORY   Past Medical History:  Diagnosis Date   Borderline hyperlipidemia    as of 10/2016: TC 134, TG 90, HDL 51, LDL 95 (PCP recently increase rosuvastatin dose from 10 to 20 mg)   Borderline hypertension    Coronary  artery disease involving native coronary artery of native heart without angina pectoris 03/01/2020   [facility]:; Dr. Adin Hector:  multivessel CAD => 50% pLAD & 60% mLAD, 70% pLCx & 90% mLCx,  70% mid RCA => CABG x 4   Family history of premature coronary artery disease 08/26/2017   Hx of CABG x 4 03/19/2020   [facility], [place], [place] -> CABG x4 (LIMA-LAD, SVG-OM1, SVG-OM2, SVG-dRCA   Hyperlipidemia due to dietary fat intake 08/26/2017    PAST SURGICAL HISTORY   Past Surgical History:  Procedure Laterality Date   CORONARY ARTERY BYPASS GRAFT  03/2020   [facility], [place], [place] -> reportedly CABG x4   GXT/ETT  02/23/2020   From [facility]:: 9:30 min (Stage 3). 10.8 METS 80% MPHR; NO CP but + DOE. -> 1-2 mm Inf De[pression & AVR STE = c/w Ischemia   LAPAROSCOPIC CHOLECYSTECTOMY  1995   LEFT HEART CATH AND CORONARY ANGIOGRAPHY   03/01/2020   [facility] ([facility]; Dr. Adin Hector:  multivessel CAD => 50% pLAD & 60% mLAD, 70% pLCx & 90% mLCx,  70% mid RCA => CABG   NM MYOVIEW LTD  02/2018   Clearly appears abnormal, referred for [facility], [place]   TRANSTHORACIC ECHOCARDIOGRAM  02/16/2020   [facility]:  a) Echo 02/16/2020: EF 60% with mild MR and TR.; b) Post Op Limited Echo: EF 55-60%.  Normal wall motion.  Normal RV.  Mild RA dilation.  No pericardial effusion.  Left pleural effusion noted.   TRANSTHORACIC ECHOCARDIOGRAM  07/2021   ([facility])  EF 65 to 70%.  No RWMA other than possible septal motion.  Mild MAC.  Aortic sclerosis.  Otherwise normal.    There is no immunization history on file for this patient.  MEDICATIONS/ALLERGIES   Current Meds  Medication Sig   aspirin 81 MG EC tablet Take 81 mg by mouth daily.   buPROPion (WELLBUTRIN XL) 150 MG 24 hr tablet Take one tablet daily for 7 days, then discontinue.   busPIRone (BUSPAR) 10 MG tablet Take 1/2 tablet three times daily for 7 days, then increase to one tablet three times daily.   clonazePAM (KLONOPIN) 1 MG tablet TAKE ONE TABLET BY MOUTH TWICE DAILY   clopidogrel (PLAVIX) 75 MG tablet Take 75 mg by mouth daily.   ergocalciferol (VITAMIN D2) 1.25 MG (50000 UT) capsule Take 50,000 Units by mouth daily.   ezetimibe (ZETIA) 10 MG tablet Take 1 tablet (10 mg total) by mouth daily.   midodrine (PROAMATINE) 5 MG tablet Take 5 mg  in the morning  and take 2.5 mg ( 1/2 tablet ) in the evening.   rosuvastatin (CRESTOR) 20 MG tablet Take 20 mg by mouth daily.   Sennosides 15 MG TABS Take 15 mg by mouth daily. Takes 8 tablets a night   valbenazine (INGREZZA) 40 MG capsule Take 1 capsule (40 mg total) by mouth daily.    No Known Allergies  SOCIAL HISTORY/FAMILY HISTORY   Reviewed in Epic:  Pertinent findings:  Social History   Tobacco Use   Smoking status:  Never   Smokeless tobacco: Never  Substance Use Topics   Alcohol use: Not Currently   Drug use: Never   Social History   Social History Narrative   She is single.  Lives alone.    Does not routinely exercise  About 3 years ago, she moved down to [place], [place] (in the [place]) delivered with her brother after being born and raised in [place].   Notable Family History: Mother had CABG in her 73s (died at age 76), brother had a stent in his 40s-still alive.   OBJCTIVE -PE, EKG, labs   Wt Readings from Last 3 Encounters:  07/02/22 145 lb 8 oz (66 kg)  01/01/22 145 lb 9.6 oz (66 kg)  06/17/21 144 lb 12.8 oz (65.7 kg)    Physical Exam: BP 112/64   Pulse 77   Wt 145 lb 8 oz (66 kg)   SpO2 98%   BMI 26.61 kg/m  Physical Exam Vitals reviewed.  Constitutional:      General: She is not in acute distress.    Appearance: Normal appearance. She is normal weight. She is not ill-appearing or toxic-appearing.  HENT:     Head: Normocephalic.  Neck:     Vascular: No carotid bruit.  Cardiovascular:     Rate and Rhythm: Normal rate and regular rhythm.     Pulses: Normal pulses.     Heart sounds: Normal heart sounds. No murmur heard.    No friction rub. No gallop.  Pulmonary:     Effort: Pulmonary effort is normal. No respiratory distress.     Breath sounds: Normal breath sounds. No wheezing, rhonchi or rales.  Chest:     Chest wall: No tenderness.  Musculoskeletal:        General: No swelling. Normal range of motion.     Cervical back: Normal range of motion and neck supple.  Skin:    General: Skin is warm and dry.  Neurological:     General: No focal deficit present.     Mental Status: She is alert and oriented to person, place, and time. Mental status is at baseline.     Gait: Gait normal.  Psychiatric:        Mood and Affect: Mood normal.        Behavior: Behavior normal.        Thought Content: Thought content normal.        Judgment: Judgment normal.      Adult ECG Report  Rate: 77 ;  Rhythm: normal sinus rhythm and IRBBB.  Otherwise normal axis, intervals and durations. ;   Narrative Interpretation: Stable  Recent Labs: Reviewed. Lab Results  Component Value Date   CHOL 110 03/03/2022   HDL 44 03/03/2022   LDLCALC 49 03/03/2022   TRIG 89 03/03/2022   CHOLHDL 2.5 03/03/2022   Lab Results  Component Value Date   CREATININE 0.79 10/16/2017   BUN 14 10/16/2017   NA 141 10/16/2017   K 4.6 10/16/2017   CL 102 10/16/2017   CO2 25 10/16/2017       No data to display          No results found for: "HGBA1C" No results found for: "TSH"  ================================================== I spent a total of 24 minutes with the patient spent in direct patient consultation.  Additional time spent with chart review  / charting (studies, outside notes, etc): 28 min => I was able to review her records from [place]-stress test, echo, cardiac cath and CABG reports.  Also the admission and discharge notes. Total Time: 52 min  Current medicines are reviewed at length with the patient today.  (+/- concerns) bruising  Notice: This dictation was prepared with Dragon dictation along with smart phrase technology. Any transcriptional errors that  result from this process are unintentional and may not be corrected upon review.  Studies Ordered:   Orders Placed This Encounter  Procedures   Lipid panel   Comprehensive metabolic panel   EKG 12-Lead   No orders of the defined types were placed in this encounter.   Patient Instructions / Medication Changes & Studies & Tests Ordered   Patient Instructions  Medication Instructions:  Stop taking Aspirin  *If you need a refill on your cardiac medications before your next appointment, please call your pharmacy*   Lab Work: in Jan/Feb 2025 Lipid CMP If you have labs (blood work) drawn today and your tests are completely normal, you will receive your results only by: MyChart Message  (if you have MyChart) OR A paper copy in the mail If you have any lab test that is abnormal or we need to change your treatment, we will call you to review the results.   Testing/Procedures:  Not needed  Follow-Up: At [facility], you and your health needs are our priority.  As part of our continuing mission to provide you with exceptional heart care, we have created designated Provider Care Teams.  These Care Teams include your primary Cardiologist (physician) and Advanced Practice Providers (APPs -  Physician Assistants and Nurse Practitioners) who all work together to provide you with the care you need, when you need it.     Your next appointment:   9 month(s)  The format for your next appointment:   In Person  Provider:   Bryan Lemma, MD      Marykay Lex, MD, MS Bryan Lemma, M.D., M.S. Interventional Cardiologist  [facility]  Pager # [phone] Phone # [phone] [address] [place], [place] [address]   Thank you for choosing [facility] [place]!!

## 2022-07-02 NOTE — Patient Instructions (Addendum)
Medication Instructions:  Stop taking Aspirin  *If you need a refill on your cardiac medications before your next appointment, please call your pharmacy*   Lab Work: in Jan/Feb 2025 Lipid CMP If you have labs (blood work) drawn today and your tests are completely normal, you will receive your results only by: MyChart Message (if you have MyChart) OR A paper copy in the mail If you have any lab test that is abnormal or we need to change your treatment, we will call you to review the results.   Testing/Procedures:  Not needed  Follow-Up: At [facility], you and your health needs are our priority.  As part of our continuing mission to provide you with exceptional heart care, we have created designated Provider Care Teams.  These Care Teams include your primary Cardiologist (physician) and Advanced Practice Providers (APPs -  Physician Assistants and Nurse Practitioners) who all work together to provide you with the care you need, when you need it.     Your next appointment:   9 month(s)  The format for your next appointment:   In Person  Provider:   Bryan Lemma, MD

## 2022-07-11 ENCOUNTER — Encounter: Payer: Self-pay | Admitting: Adult Health

## 2022-07-11 ENCOUNTER — Ambulatory Visit: Payer: BC Managed Care – PPO | Admitting: Adult Health

## 2022-07-11 DIAGNOSIS — F331 Major depressive disorder, recurrent, moderate: Secondary | ICD-10-CM | POA: Diagnosis not present

## 2022-07-11 DIAGNOSIS — G2401 Drug induced subacute dyskinesia: Secondary | ICD-10-CM

## 2022-07-11 DIAGNOSIS — F410 Panic disorder [episodic paroxysmal anxiety] without agoraphobia: Secondary | ICD-10-CM | POA: Diagnosis not present

## 2022-07-11 DIAGNOSIS — G4700 Insomnia, unspecified: Secondary | ICD-10-CM | POA: Diagnosis not present

## 2022-07-11 DIAGNOSIS — F411 Generalized anxiety disorder: Secondary | ICD-10-CM

## 2022-07-11 NOTE — Progress Notes (Signed)
Heather Lucas [id number] October 04, 1960 62 y.o.  Subjective:   Patient ID:  Heather Lucas is a 62 y.o. (DOB 1960/10/27) female.  Chief Complaint: No chief complaint on file.   HPI Kenneth Daigler presents to the office today for follow-up of MDD, GAD, panic attacks and TD.  Describes mood today as "ok". Pleasant. Denies tearfulness. Mood symptoms - denies depression - "I might be sad because I don't have anyone to spend time with". Reports anxiety - "mostly, it's just at work".  Denies irritability. Reports "some" worry, rumination, and over thinking - worries about losing her job. Mood is consistent. Stating "I'm not doing good, but I don't know why". Has tapered off of Wellbutrin and is eating better. Reports taking Buspar once daily most days, but feels like it makes her sleepy.  Reports taking Clonazepam at bedtime for sleep. Reports stable interest and motivation, but doesn't have a lot to do. Taking medications as prescribed.  Energy levels low. Active, does not have a regular exercise routine. Enjoys some usual interests and activities. Single. Lives alone. Brother in [place]. Friends local. Attends church. Appetite adequate. Weight stable - 114 from 125 pounds. Sleeps well most nights. Averages 10 hours. Taking Melatonin.  Focus and concentration difficulties. Completing tasks. Managing aspects of household. 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  Denies AH or VH. Denies self harm. Denies substance use.  Previous medication trials:  Zoloft, Prozac, Wellbutrin, Lexapro, Trintellix, Hydroxyzine.  AIMS    Flowsheet Row Office Visit from 12/13/2021 in [facility]  AIMS Total Score 28       Review of Systems:  Review of Systems  Musculoskeletal:  Negative for gait problem.  Neurological:  Negative for tremors.  Psychiatric/Behavioral:         Please refer to HPI    Medications: I have reviewed the patient's current  medications.  Current Outpatient Medications  Medication Sig Dispense Refill   buPROPion (WELLBUTRIN XL) 150 MG 24 hr tablet Take one tablet daily for 7 days, then discontinue. 7 tablet 0   busPIRone (BUSPAR) 10 MG tablet Take 1/2 tablet three times daily for 7 days, then increase to one tablet three times daily. 90 tablet 2   clonazePAM (KLONOPIN) 1 MG tablet TAKE ONE TABLET BY MOUTH TWICE DAILY 60 tablet 2   clopidogrel (PLAVIX) 75 MG tablet Take 75 mg by mouth daily.     ergocalciferol (VITAMIN D2) 1.25 MG (50000 UT) capsule Take 50,000 Units by mouth daily.     ezetimibe (ZETIA) 10 MG tablet Take 1 tablet (10 mg total) by mouth daily. 90 tablet 3   midodrine (PROAMATINE) 5 MG tablet Take 5 mg  in the morning  and take 2.5 mg ( 1/2 tablet ) in the evening. 45 tablet 3   rosuvastatin (CRESTOR) 20 MG tablet Take 20 mg by mouth daily.  5   Sennosides 15 MG TABS Take 15 mg by mouth daily. Takes 8 tablets a night     valbenazine (INGREZZA) 40 MG capsule Take 1 capsule (40 mg total) by mouth daily. 30 capsule 5   No current facility-administered medications for this visit.    Medication Side Effects: None  Allergies: No Known Allergies  Past Medical History:  Diagnosis Date   Agatston coronary artery calcium score between 100 and 199 08/26/2017   Borderline hyperlipidemia    as of 10/2016: TC 134, TG 90, HDL 51, LDL 95 (PCP recently increase rosuvastatin dose from 10 to 20  mg)   Borderline hypertension    Coronary artery disease involving native coronary artery of native heart without angina pectoris 02/18/2020   Family history of premature coronary artery disease 08/26/2017   Hx of CABG x 4 03/19/2020   [facility], [place], [place] -> reportedly CABG x4   Hyperlipidemia due to dietary fat intake 08/26/2017   Ischemic cardiomyopathy 06/17/2021    Past Medical History, Surgical history, Social history, and Family history were reviewed and updated as appropriate.    Please see review of systems for further details on the patient's review from today.   Objective:   Physical Exam:  There were no vitals taken for this visit.  Physical Exam Constitutional:      General: She is not in acute distress. Musculoskeletal:        General: No deformity.  Neurological:     Mental Status: She is alert and oriented to person, place, and time.     Coordination: Coordination normal.  Psychiatric:        Attention and Perception: Attention and perception normal. She does not perceive auditory or visual hallucinations.        Mood and Affect: Mood normal. Mood is not anxious or depressed. Affect is not labile, blunt, angry or inappropriate.        Speech: Speech normal.        Behavior: Behavior normal.        Thought Content: Thought content normal. Thought content is not paranoid or delusional. Thought content does not include homicidal or suicidal ideation. Thought content does not include homicidal or suicidal plan.        Cognition and Memory: Cognition and memory normal.        Judgment: Judgment normal.     Comments: Insight intact     Lab Review:     Component Value Date/Time   NA 141 10/16/2017 1412   K 4.6 10/16/2017 1412   CL 102 10/16/2017 1412   CO2 25 10/16/2017 1412   GLUCOSE 86 10/16/2017 1412   BUN 14 10/16/2017 1412   CREATININE 0.79 10/16/2017 1412   CALCIUM 9.0 10/16/2017 1412   PROT 6.0 03/03/2022 0833   ALBUMIN 4.1 03/03/2022 0833   AST 22 03/03/2022 0833   ALT 18 03/03/2022 0833   ALKPHOS 92 03/03/2022 0833   BILITOT 0.5 03/03/2022 0833   GFRNONAA 83 10/16/2017 1412   GFRAA 95 10/16/2017 1412    No results found for: "WBC", "RBC", "HGB", "HCT", "PLT", "MCV", "MCH", "MCHC", "RDW", "LYMPHSABS", "MONOABS", "EOSABS", "BASOSABS"  No results found for: "POCLITH", "LITHIUM"   No results found for: "PHENYTOIN", "PHENOBARB", "VALPROATE", "CBMZ"   .res Assessment: Plan:    Plan:  PDMP reviewed  Clonazepam 1mg  twice  daily - taking one tablet at bedtime  Ingrezza 40mg  daily at bedtime - stopped for a few days and restarted.  D/C Buspar 10mg  three times daily - has not been taking daily or as needed  Time spent with patient was 25 minutes. Greater than 50% of face to face time with patient was spent on counseling and coordination of care.    RTC 2 weeks.  Patient advised to contact office with any questions, adverse effects, or acute worsening in signs and symptoms.   Discussed potential benefits, risk, and side effects of benzodiazepines to include potential risk of tolerance and dependence, as well as possible drowsiness.  Advised patient not to drive if experiencing drowsiness and to take lowest possible effective dose to minimize risk  of dependence and tolerance.   Discussed potential metabolic side effects associated with atypical antipsychotics, as well as potential risk for movement side effects. Advised pt to contact office if movement side effects occur  There are no diagnoses linked to this encounter.   Please see After Visit Summary for patient specific instructions.  Future Appointments  Date Time Provider Department Center  07/22/2022  3:00 PM Waldron Session, [facility] None    No orders of the defined types were placed in this encounter.   -------------------------------

## 2022-07-12 NOTE — Progress Notes (Incomplete)
Primary Care Provider: Cleatis Polka., MD [facility] Cardiologist: Heather Lemma, MD Electrophysiologist: None  Clinic Note: No chief complaint on file.   ===================================  ASSESSMENT/PLAN   Problem List Items Addressed This Visit   None  ===================================  HPI:    Heather Lucas is a 62 y.o. female with a PMH for MV CAD=> CABG x 4, resolved ICM, HLD & HYPOTENSION (on midodrine) who presents today for for chronic follow-up..  Initial evaluation July 2019: Abnormal coronary calcium score (125, involving LM, LAD and RCA) and fatigue Coronary CTA: Calcium score 148.  Mild nonobstructive CAD.  Recommended aggressive resector modification.  She subsequently moved to [place] to live near her family and while she was evaluated with a routine Echo and Stress Test that were read as abnormal.  => Cath revealed severe CAD, and reduced EF => CABG x 4 03/19/2020.  (We do not have Op Note) Echo  07/2021:  EF 65-70%. No RWMA, beyond postop septal dyssynergy. Normal Valves.   After moving back to [place], she was seen again here on 06/17/2021 and was stable from a cardiac standpoint though she did note stable chronic exertional dyspnea and chronic fatigue.   Heather Lucas was last seen on ***  Recent Hospitalizations: ***  Reviewed  CV studies:    The following studies were reviewed today: (if available, images/films reviewed: From Epic Chart or Care Everywhere) Echo June 2023: EF 65 to 70%.  No RWMA other than possible septal motion.  Mild MAC.  Aortic sclerosis.  Otherwise normal.  From [facility]January-February 2022 Stress Test 02/23/2020: 9:30 min (Stage 3) Echo: EF 60% with mild MR and TR. Cardiac catheterization, Dr. Adin Lucas -> multivessel CAD => 50% prox LAD & 60% mLAD, 70% prox LCx & 90% mLCx,  70% mid RCA  Interval History:   Heather Lucas   CV Review of Symptoms (Summary):  {roscv:310661}  REVIEWED OF SYSTEMS   ROS  I have reviewed and (if needed) personally updated the patient's problem list, medications, allergies, past medical and surgical history, social and family history.   PAST MEDICAL HISTORY   Past Medical History:  Diagnosis Date  . Agatston coronary artery calcium score between 100 and 199 08/26/2017  . Borderline hyperlipidemia    as of 10/2016: TC 134, TG 90, HDL 51, LDL 95 (PCP recently increase rosuvastatin dose from 10 to 20 mg)  . Borderline hypertension   . Coronary artery disease involving native coronary artery of native heart without angina pectoris 02/18/2020  . Family history of premature coronary artery disease 08/26/2017  . Hx of CABG x 4 03/19/2020   [facility], [place], [place] -> reportedly CABG x4  . Hyperlipidemia due to dietary fat intake 08/26/2017  . Ischemic cardiomyopathy 06/17/2021    PAST SURGICAL HISTORY   Past Surgical History:  Procedure Laterality Date  . CORONARY ARTERY BYPASS GRAFT  03/2020   [facility], [place], [place] -> reportedly CABG x4  . LAPAROSCOPIC CHOLECYSTECTOMY  1995  . LEFT HEART CATH AND CORONARY ANGIOGRAPHY  02/2020   [facility] [place], [place]) -> multivessel disease, referred for CABG  . NM MYOVIEW LTD  02/2018   Clearly appears abnormal, referred for Heather Lucas, [place]  . TRANSTHORACIC ECHOCARDIOGRAM  02/2020   Several echoes in January and February 2022-initially noted to be abnormal along with Myoview stress test, immediately post CABG recheck    There is no immunization history on file for this patient.  MEDICATIONS/ALLERGIES  Current Meds  Medication Sig  . aspirin 81 MG EC tablet Take 81 mg by mouth daily.  Marland Kitchen buPROPion (WELLBUTRIN XL) 150 MG 24 hr tablet Take one tablet daily for 7 days, then discontinue.  . busPIRone (BUSPAR) 10 MG tablet Take 1/2 tablet three times daily for 7 days, then increase to one tablet three times  daily.  . clonazePAM (KLONOPIN) 1 MG tablet TAKE ONE TABLET BY MOUTH TWICE DAILY  . clopidogrel (PLAVIX) 75 MG tablet Take 75 mg by mouth daily.  . ergocalciferol (VITAMIN D2) 1.25 MG (50000 UT) capsule Take 50,000 Units by mouth daily.  Marland Kitchen ezetimibe (ZETIA) 10 MG tablet Take 1 tablet (10 mg total) by mouth daily.  . midodrine (PROAMATINE) 5 MG tablet Take 5 mg  in the morning  and take 2.5 mg ( 1/2 tablet ) in the evening.  . rosuvastatin (CRESTOR) 20 MG tablet Take 20 mg by mouth daily.  . Sennosides 15 MG TABS Take 15 mg by mouth daily. Takes 8 tablets a night  . valbenazine (INGREZZA) 40 MG capsule Take 1 capsule (40 mg total) by mouth daily.    No Known Allergies  SOCIAL HISTORY/FAMILY HISTORY   Reviewed in Epic:  Pertinent findings:  Social History   Tobacco Use  . Smoking status: Never  . Smokeless tobacco: Never  Substance Use Topics  . Alcohol use: Not Currently  . Drug use: Never   Social History   Social History Narrative   She is single.  Lives alone.    Does not routinely exercise      About 3 years ago, she moved down to [place], [place] (in the [place]) delivered with her brother after being born and raised in [place].   Notable Family History: Mother had CABG in her 67s (died at age 30), brother had a stent in his 40s-still alive.   OBJCTIVE -PE, EKG, labs   Wt Readings from Last 3 Encounters:  07/02/22 145 lb 8 oz (66 kg)  01/01/22 145 lb 9.6 oz (66 kg)  06/17/21 144 lb 12.8 oz (65.7 kg)    Physical Exam: BP 112/64   Pulse 77   Wt 145 lb 8 oz (66 kg)   SpO2 98%   BMI 26.61 kg/m  Physical Exam   Adult ECG Report  Rate: *** ;  Rhythm: {rhythm:17366};   Narrative Interpretation: ***  Recent Labs:  ***  Lab Results  Component Value Date   CHOL 110 03/03/2022   HDL 44 03/03/2022   LDLCALC 49 03/03/2022   TRIG 89 03/03/2022   CHOLHDL 2.5 03/03/2022   Lab Results  Component Value Date   CREATININE 0.79 10/16/2017   BUN 14  10/16/2017   NA 141 10/16/2017   K 4.6 10/16/2017   CL 102 10/16/2017   CO2 25 10/16/2017       No data to display          No results found for: "HGBA1C" No results found for: "TSH"  ================================================== I spent a total of ***minutes with the patient spent in direct patient consultation.  Additional time spent with chart review  / charting (studies, outside notes, etc): *** min Total Time: *** min  Current medicines are reviewed at length with the patient today.  (+/- concerns) ***  Notice: This dictation was prepared with Dragon dictation along with smart phrase technology. Any transcriptional errors that result from this process are unintentional and may not be corrected upon review.  Studies Ordered:   No  orders of the defined types were placed in this encounter.  No orders of the defined types were placed in this encounter.   Patient Instructions / Medication Changes & Studies & Tests Ordered   There are no Patient Instructions on file for this visit.     Marykay Lex, MD, MS Heather Lucas, M.D., M.S. Interventional Cardiologist  [facility]  Pager # [phone] Phone # [phone] [address] [place], [place] [address]   Thank you for choosing [facility] [place]!!

## 2022-07-13 ENCOUNTER — Encounter: Payer: Self-pay | Admitting: Cardiology

## 2022-07-13 NOTE — Assessment & Plan Note (Signed)
Remains hypotensive on low-dose midodrine.  Just to avoid dizziness and wooziness, I think we will continue midodrine tissues doing well.  Continue to encourage adequate hydration.  The father she gets that we will try to wean her off midodrine.

## 2022-07-13 NOTE — Assessment & Plan Note (Signed)
Labs from January look great.  LDL down to 49 after adding Zetia.  Continue current dose of rosuvastatin 20 mg daily and Zetia 10 mg.  Follow-up labs in January/February 2025.

## 2022-07-13 NOTE — Assessment & Plan Note (Signed)
This was the "anginal equivalent "that led to her stress test and eventually CABG.  Now that she is back in better shape, and really not noticing significant fatigue and dyspnea.

## 2022-07-13 NOTE — Assessment & Plan Note (Signed)
Pretty rapid progression of disease from when I saw her back in 20 19-20 22.  Interestingly, this was all based on screening stress test with exertional dyspnea after 9 and half minutes.  She was not as symptomatic at that time.  She is now status post CABG x 4.  Feeling well with no history of exercise dyspnea. Echo shows well-preserved EF with no evidence of ischemic cardiomyopathy.  No evidence of infarct.  Plan: Stop aspirin, continue Plavix-hopefully this will help her bruising. She is not on the beta-blocker or ARB because of hypotension.  In fact she is actually on midodrine. => Currently at 2.5 mg twice daily pressures are still, low.  Continue for now. She is now combination of Zetia and 20 mg Crestor.  Unable to tolerate higher dose of Crestor.  Lipids look great now. Post CABG, due for follow-up stress test in 2027

## 2022-07-13 NOTE — Assessment & Plan Note (Signed)
Preserved EF on echo.  Due for follow-up ischemic evaluation February 2027

## 2022-07-14 ENCOUNTER — Other Ambulatory Visit: Payer: Self-pay

## 2022-07-14 ENCOUNTER — Telehealth: Payer: Self-pay | Admitting: Adult Health

## 2022-07-14 NOTE — Telephone Encounter (Signed)
Please see message and advise 

## 2022-07-14 NOTE — Telephone Encounter (Signed)
We can try the 60mg  dose again versus going to the 80mg . We could also try switching her to austedo - she may do better with it.

## 2022-07-14 NOTE — Telephone Encounter (Signed)
Pt called and said that she is having more problems with her legs shaking. She wants to know if she can go up on the ingrezza.However when she goes up on that medication it makes her more anxious. So she doesn't know if she needs to go up on her anxiety medicine. Please give her a call at [phone]

## 2022-07-15 ENCOUNTER — Other Ambulatory Visit: Payer: Self-pay

## 2022-07-15 NOTE — Telephone Encounter (Signed)
Patient doesn't have 60 mg Ingrezza. I can send in Rx if needed. She reports the leg shaking is very fast and It is increasing her anxiety. The clonazepam helps but makes her too sleepy to take during the day because she can't focus on work.

## 2022-07-15 NOTE — Telephone Encounter (Signed)
We could try switching her to Lorazepam if it's making her too sleepy - or she can reduce the dose of Clonazepam to 0.5mg  - which I feel like we discussed at her appointment.

## 2022-07-15 NOTE — Telephone Encounter (Signed)
Rx for lorazepam pended.

## 2022-07-15 NOTE — Telephone Encounter (Signed)
Mailbox is full.

## 2022-07-16 ENCOUNTER — Ambulatory Visit (HOSPITAL_COMMUNITY)
Admission: EM | Admit: 2022-07-16 | Discharge: 2022-07-16 | Disposition: A | Discharge status: Other Acute Inpatient Hospital | Payer: BC Managed Care – PPO | Attending: Behavioral Health | Admitting: Behavioral Health

## 2022-07-16 ENCOUNTER — Encounter (HOSPITAL_COMMUNITY): Payer: Self-pay | Admitting: Behavioral Health

## 2022-07-16 ENCOUNTER — Other Ambulatory Visit: Payer: Self-pay

## 2022-07-16 ENCOUNTER — Telehealth: Payer: Self-pay | Admitting: Adult Health

## 2022-07-16 DIAGNOSIS — G2401 Drug induced subacute dyskinesia: Secondary | ICD-10-CM | POA: Diagnosis not present

## 2022-07-16 DIAGNOSIS — E538 Deficiency of other specified B group vitamins: Secondary | ICD-10-CM | POA: Diagnosis not present

## 2022-07-16 DIAGNOSIS — F332 Major depressive disorder, recurrent severe without psychotic features: Secondary | ICD-10-CM

## 2022-07-16 DIAGNOSIS — Z79899 Other long term (current) drug therapy: Secondary | ICD-10-CM | POA: Diagnosis not present

## 2022-07-16 DIAGNOSIS — F411 Generalized anxiety disorder: Secondary | ICD-10-CM | POA: Diagnosis not present

## 2022-07-16 DIAGNOSIS — R45851 Suicidal ideations: Secondary | ICD-10-CM | POA: Diagnosis not present

## 2022-07-16 DIAGNOSIS — R5382 Chronic fatigue, unspecified: Secondary | ICD-10-CM | POA: Diagnosis not present

## 2022-07-16 DIAGNOSIS — E611 Iron deficiency: Secondary | ICD-10-CM | POA: Diagnosis not present

## 2022-07-16 LAB — COMPREHENSIVE METABOLIC PANEL
ALT: 20 U/L (ref 0–44)
AST: 24 U/L (ref 15–41)
Albumin: 3.9 g/dL (ref 3.5–5.0)
Alkaline Phosphatase: 75 U/L (ref 38–126)
Anion gap: 11 (ref 5–15)
BUN: 10 mg/dL (ref 8–23)
CO2: 25 mmol/L (ref 22–32)
Calcium: 9.1 mg/dL (ref 8.9–10.3)
Chloride: 105 mmol/L (ref 98–111)
Creatinine, Ser: 1.05 mg/dL — ABNORMAL HIGH (ref 0.44–1.00)
GFR, Estimated: >60 mL/min (ref >60)
Glucose, Bld: 167 mg/dL — ABNORMAL HIGH (ref 70–99)
Potassium: 4 mmol/L (ref 3.5–5.1)
Sodium: 141 mmol/L (ref 135–145)
Total Bilirubin: 0.8 mg/dL (ref 0.3–1.2)
Total Protein: 5.8 g/dL — ABNORMAL LOW (ref 6.5–8.1)

## 2022-07-16 LAB — POC URINE PREG, ED: Preg Test, Ur: NEGATIVE

## 2022-07-16 LAB — CBC WITH DIFFERENTIAL/PLATELET
Abs Immature Granulocytes: 0.02 10*3/uL (ref 0.00–0.07)
Basophils Absolute: 0 10*3/uL (ref 0.0–0.1)
Basophils Relative: 1 %
Eosinophils Absolute: 0 10*3/uL (ref 0.0–0.5)
Eosinophils Relative: 1 %
HCT: 40.6 % (ref 36.0–46.0)
Hemoglobin: 13.5 g/dL (ref 12.0–15.0)
Immature Granulocytes: 0 %
Lymphocytes Relative: 30 %
Lymphs Abs: 1.8 10*3/uL (ref 0.7–4.0)
MCH: 30.7 pg (ref 26.0–34.0)
MCHC: 33.3 g/dL (ref 30.0–36.0)
MCV: 92.3 fL (ref 80.0–100.0)
Monocytes Absolute: 0.4 10*3/uL (ref 0.1–1.0)
Monocytes Relative: 8 %
Neutro Abs: 3.6 10*3/uL (ref 1.7–7.7)
Neutrophils Relative %: 60 %
Platelets: 209 10*3/uL (ref 150–400)
RBC: 4.4 MIL/uL (ref 3.87–5.11)
RDW: 11.9 % (ref 11.5–15.5)
WBC: 5.9 10*3/uL (ref 4.0–10.5)
nRBC: 0 % (ref 0.0–0.2)

## 2022-07-16 LAB — URINALYSIS, ROUTINE W REFLEX MICROSCOPIC
Bacteria, UA: NONE SEEN
Bilirubin Urine: NEGATIVE
Glucose, UA: NEGATIVE mg/dL
Hgb urine dipstick: NEGATIVE
Ketones, ur: NEGATIVE mg/dL
Nitrite: NEGATIVE
Protein, ur: NEGATIVE mg/dL
Specific Gravity, Urine: 1.009 (ref 1.005–1.030)
pH: 6 (ref 5.0–8.0)

## 2022-07-16 LAB — LIPID PANEL
Cholesterol: 112 mg/dL (ref 0–200)
HDL: 44 mg/dL (ref >40)
LDL Cholesterol: 49 mg/dL (ref 0–99)
Total CHOL/HDL Ratio: 2.5 RATIO
Triglycerides: 96 mg/dL (ref <150)
VLDL: 19 mg/dL (ref 0–40)

## 2022-07-16 LAB — POCT URINE DRUG SCREEN - MANUAL ENTRY (I-SCREEN)
POC Amphetamine UR: NOT DETECTED
POC Buprenorphine (BUP): NOT DETECTED
POC Cocaine UR: NOT DETECTED
POC Marijuana UR: NOT DETECTED
POC Methadone UR: NOT DETECTED
POC Methamphetamine UR: NOT DETECTED
POC Morphine: NOT DETECTED
POC Oxazepam (BZO): POSITIVE — AB
POC Oxycodone UR: NOT DETECTED
POC Secobarbital (BAR): NOT DETECTED

## 2022-07-16 LAB — ETHANOL: Alcohol, Ethyl (B): <10 mg/dL (ref <10)

## 2022-07-16 LAB — HEMOGLOBIN A1C
Hgb A1c MFr Bld: 5 % (ref 4.8–5.6)
Mean Plasma Glucose: 96.8 mg/dL

## 2022-07-16 LAB — MAGNESIUM: Magnesium: 2.2 mg/dL (ref 1.7–2.4)

## 2022-07-16 LAB — TSH: TSH: 1.925 u[IU]/mL (ref 0.350–4.500)

## 2022-07-16 MED ORDER — LORAZEPAM 0.5 MG PO TABS: 0.5000 mg | ORAL_TABLET | Freq: Three times a day (TID) | ORAL | 0 refills | Status: DC | PRN

## 2022-07-16 MED ORDER — CLOPIDOGREL BISULFATE 75 MG PO TABS: 75.0000 mg | ORAL_TABLET | Freq: Every day | ORAL | Status: DC

## 2022-07-16 MED ORDER — ALUM & MAG HYDROXIDE-SIMETH 200-200-20 MG/5ML PO SUSP: 30.0000 mL | ORAL | Status: DC | PRN

## 2022-07-16 MED ORDER — HYDROXYZINE HCL 25 MG PO TABS
25.0000 mg | ORAL_TABLET | Freq: Three times a day (TID) | ORAL | Status: DC | PRN
  Administered 2022-07-16: 25 mg via ORAL
  Filled 2022-07-16: qty 1

## 2022-07-16 MED ORDER — TRAZODONE HCL 50 MG PO TABS: 50.0000 mg | ORAL_TABLET | Freq: Every evening | ORAL | Status: DC | PRN

## 2022-07-16 MED ORDER — MIDODRINE HCL 2.5 MG PO TABS
2.5000 mg | ORAL_TABLET | Freq: Two times a day (BID) | ORAL | Status: DC
  Filled 2022-07-16: qty 1

## 2022-07-16 MED ORDER — ACETAMINOPHEN 325 MG PO TABS
650.0000 mg | ORAL_TABLET | Freq: Four times a day (QID) | ORAL | Status: DC | PRN
  Administered 2022-07-16: 650 mg via ORAL
  Filled 2022-07-16: qty 2

## 2022-07-16 MED ORDER — MIDODRINE HCL 2.5 MG PO TABS
2.5000 mg | ORAL_TABLET | Freq: Two times a day (BID) | ORAL | Status: DC
  Filled 2022-07-16 (×2): qty 1

## 2022-07-16 MED ORDER — EZETIMIBE 10 MG PO TABS: 10.0000 mg | ORAL_TABLET | Freq: Every day | ORAL | Status: DC

## 2022-07-16 MED ORDER — LORAZEPAM 0.5 MG PO TABS
0.5000 mg | ORAL_TABLET | Freq: Three times a day (TID) | ORAL | Status: DC | PRN
  Administered 2022-07-16: 0.5 mg via ORAL
  Filled 2022-07-16: qty 1

## 2022-07-16 MED ORDER — VALBENAZINE TOSYLATE 60 MG PO CAPS
60.0000 mg | ORAL_CAPSULE | Freq: Every day | ORAL | Status: DC
  Administered 2022-07-16: 60 mg via ORAL
  Filled 2022-07-16: qty 1

## 2022-07-16 MED ORDER — MAGNESIUM HYDROXIDE 400 MG/5ML PO SUSP: 30.0000 mL | Freq: Every day | ORAL | Status: DC | PRN

## 2022-07-16 MED ORDER — ROSUVASTATIN CALCIUM 20 MG PO TABS: 20.0000 mg | ORAL_TABLET | Freq: Every day | ORAL | Status: DC

## 2022-07-16 NOTE — BH Assessment (Signed)
Comprehensive Clinical Assessment (CCA) Note  07/16/2022 Heather Lucas [id number]  DISPOSITION: Per Heather Champagne NP pt is recommended for Inpatient psychiatric treatment.   The patient demonstrates the following risk factors for suicide: Chronic risk factors for suicide include: psychiatric disorder of MDD and GAD . Acute risk factors for suicide include: social withdrawal/isolation and depression sx . Protective factors for this patient include: positive social support, positive therapeutic relationship, and hope for the future. Considering these factors, the overall suicide risk at this point appears to be high. Patient is appropriate for outpatient follow up.   Per Triage assessment: "Heather Lucas is a 62 year old female presenting to [facility] voluntarily with chief complaint of SI no plan or intention to act on them per her report, however patient called NP at [facility] and informed them that she was having SI with plant to OD or hang herself. Pt reports stressors to include TD for the past year and reports that it is hard to deal with it. Pt denies HI, AVH and substance use. Pt has not acted on thoughts and denies history of suicide attempts. Pt reports worsening depression and SI for the past couple of months. Pt reports she has not motivation to do anything so she sits at home which concerns her friends."  With further assessment:   Pt stated that she has been thinking at times about killing herself but added I don't think I could really do it." Pt stated that she had thoughts of intentionally overdosing on her prescribed medications or hanging herself. Pt stated that she has a wide variety if prescribed medication but is afraid "it won't work." Pt stated that she had a friend who hanged himself about 7-8 years  Pt stated that most of the time she has thoughts like "I just don't want to be here anymore." ago.   Pt denied any previous attempts to kill herself or hurt anyone else. Pt stated  that her TD was her biggest stressor. She added that the medicines that have tried are not working. Pt has CAD and angina as well per hx.   Pt stated she has lost 20-30 lbs. in about the past 6 months without trying to reduce. Pt stated she "is sleeping too much." Pt stated that for example when she gets up in the morning on the weekend she moves to the sofa where she stays until Monday and time to go to work. Pt stated that while on the sofa she often is sleeping. Pt stated she is worried about not working fast enough at this point. Pt stated that at times, she drools due to the TD. Pt stated that she is feeling sad, uninterested or unmotivated to participate in activities like work, bathing and doing household chores. Pt stated that she is often feeling fatigued and has trouble concentrating.  Pt stated that she lives alone and works at a [employer]. One of her best supports is at work. Pt stated she has 2 other friends who are her primary supports, one of which they talk on the phone daily. Pt denied any access to weapons, denied any hx of abuse and has not legal issues currently. Pt stated that she was divorced about 30 years ago and has no children.   Pt sees Heather Nasuti NP for medication management and Heather Lucas, [facility], for OP therapy at [facility]. Pt stated that she has not seem Heather Lucas but about 3 times and is not sure that she can build rapport  with him.    Chief Complaint:  Chief Complaint  Patient presents with   Suicidal   Visit Diagnosis:  MDD, Recurrent, Moderate to Severe GAD     CCA Screening, Triage and Referral (STR)  Patient Reported Information How did you hear about [place]? Other (Comment)  What Is the Reason for Your Visit/Call Today? Heather Lucas is a 62 year old female presenting to [facility] voluntarily with chief complaint of SI no plan or intention to act on them per her report, however patient called NP at [facility] and  informed them that she was having SI with plant to OD or hang herself. Pt reports stressors to include TD for the past year and reports that it is hard to deal with it. Pt denies HI, AVH and substance use. Pt has not acted on thoughts and denies history of suicide attempts. Pt reports worsening depression and SI for the past couple of months. Pt reports she has not motivation to do anything so she sits at home which concerns her friends.  How Long Has This Been Causing You Problems? 1-6 months  What Do You Feel Would Help You the Most Today? Treatment for Depression or other mood problem   Have You Recently Had Any Thoughts About Hurting Yourself? Yes  Are You Planning to Commit Suicide/Harm Yourself At This time? Yes   Flowsheet Row ED from 07/16/2022 in [facility]  C-SSRS RISK CATEGORY Moderate Risk       Have you Recently Had Thoughts About Hurting Someone Heather Lucas? No  Are You Planning to Harm Someone at This Time? No  Explanation: na  Have You Used Any Alcohol or Drugs in the Past 24 Hours? No  What Did You Use and How Much? na  Do You Currently Have a Therapist/Psychiatrist? Yes  Name of Therapist/Psychiatrist: Name of Therapist/Psychiatrist: Pt sees Heather Nasuti NP for medication management and Heather Lucas, [facility], for OP therapy at [facility].   Have You Been Recently Discharged From Any Office Practice or Programs? Yes  Explanation of Discharge From Practice/Program: Pt saw her PCP yesterday per her report.     CCA Screening Triage Referral Assessment Type of Contact: Face-to-Face  Telemedicine Service Delivery:   Is this Initial or Reassessment?   Date Telepsych consult ordered in [facility]:    Time Telepsych consult ordered in [facility]:    Location of Assessment: [facility] Assessment Services  Provider Location: [facility]   Collateral Involvement: none provided   Does Patient Have a Automotive engineer  Guardian? No  Legal Guardian Contact Information: na  Copy of Legal Guardianship Form: No - copy requested  Legal Guardian Notified of Arrival: -- (na)  Legal Guardian Notified of Pending Discharge: -- (na)  If Minor and Not Living with Parent(s), Who has Custody? adult  Is CPS involved or ever been involved? Never (none reported)  Is APS involved or ever been involved? Never (none reported)   Patient Determined To Be At Risk for Harm To Self or Others Based on Review of Patient Reported Information or Presenting Complaint? Yes, for Self-Harm  Method: Plan with intent and identified person  Availability of Means: Has close by  Intent: Vague intent or NA  Notification Required: No need or identified person  Additional Information for Danger to Others Potential: na Additional Comments for Danger to Others Potential: Pt denied any previous attempts to kill herself or hurt anyone else.  Are There Guns or Other Weapons in  Your Home? No (denied)  Types of Guns/Weapons: na  Are These Weapons Safely Secured?                            -- (na)  Who Could Verify You Are Able To Have These Secured: na  Do You Have any Outstanding Charges, Pending Court Dates, Parole/Probation? none  Contacted To Inform of Risk of Harm To Self or Others: -- (na)    Does Patient Present under Involuntary Commitment? No    [place] of Residence: [place]   Patient Currently Receiving the Following Services: Medication Management; Individual Therapy   Determination of Need: Emergent (2 hours) (Per Heather Champagne NP pt is recommended for Inpatient psychiatric treatment.)   Options For Referral: Inpatient Hospitalization     CCA Biopsychosocial Patient Reported Schizophrenia/Schizoaffective Diagnosis in Past: No   Strengths: seeking help   Mental Health Symptoms Depression:   Change in energy/activity; Difficulty Concentrating; Fatigue; Sleep (too much or little)   Duration of  Depressive symptoms:  Duration of Depressive Symptoms: Greater than two weeks   Mania:   None   Anxiety:    Difficulty concentrating; Restlessness; Worrying   Psychosis:   None   Duration of Psychotic symptoms:    Trauma:   None   Obsessions:   None   Compulsions:   None   Inattention:   N/A   Hyperactivity/Impulsivity:   N/A   Oppositional/Defiant Behaviors:   N/A   Emotional Irregularity:   None   Other Mood/Personality Symptoms:   none    Mental Status Exam Appearance and self-care  Stature:   Small   Weight:   Average weight   Clothing:   Casual; Neat/clean   Grooming:   Normal   Cosmetic use:   None   Posture/gait:   Normal   Motor activity:   Not Remarkable   Sensorium  Attention:   Normal   Concentration:   Anxiety interferes; Preoccupied   Orientation:   Object; Person; Place; Situation; Time; X5   Recall/memory:   Normal   Affect and Mood  Affect:   Depressed; Flat   Mood:   Depressed; Dysphoric   Relating  Eye contact:   Fleeting (Slow in shifting attention)   Facial expression:   Depressed; Constricted   Attitude toward examiner:   Cooperative; Guarded   Thought and Language  Speech flow:  Clear and Coherent; Paucity; Slow; Soft   Thought content:   Appropriate to Mood and Circumstances   Preoccupation:   Other (Comment) (Pt stated that her TD was her biggest stressor. She added that the medicines that have tried are not working.)   Hallucinations:   None   Organization:   Therapist, nutritional of Knowledge:   Average   Intelligence:   Average   Abstraction:   Functional   Judgement:   Fair   Dance movement psychotherapist:   Adequate   Insight:   Fair   Decision Making:   Vacilates   Social Functioning  Social Maturity:   Responsible   Social Judgement:   Normal   Stress  Stressors:   Illness   Coping Ability:   Deficient supports; Overwhelmed; Exhausted   Skill  Deficits:   Self-care   Supports:   Friends/Service system; Other (Comment) (Friends)     Religion: Religion/Spirituality Are You A Religious Person?: Yes What is Your Religious Affiliation?: Baptist How Might This Affect Treatment?: unknown  Leisure/Recreation:  Leisure / Recreation Do You Have Hobbies?: No  Exercise/Diet: Exercise/Diet Do You Exercise?: No Have You Gained or Lost A Significant Amount of Weight in the Past Six Months?: Yes-Lost Number of Pounds Lost?: 20 (Pt stated she has lost 20-30 lbs. in about the past 6 months without trying to reduce.) Do You Follow a Special Diet?: No Do You Have Any Trouble Sleeping?: Yes Explanation of Sleeping Difficulties: Pt stated she "is sleeping too much." Pt stated that for example when she gets up in the morning on the weekend she moves to the sofa where she stays until Monday and time to go to work. Pt stated that while on the sofa she often is sleeping.   CCA Employment/Education Employment/Work Situation: Employment / Work Situation Employment Situation: Employed Work Stressors: Pt stated she is worried about not working fast enough at this point. Pt stated that at times, she drools due to the TD. Patient's Job has Been Impacted by Current Illness: Yes Describe how Patient's Job has Been Impacted: not working fast enough she fears Has Patient ever Been in Equities trader?: No  Education: Education Is Patient Currently Attending School?: No Last Grade Completed: 12 Did You Attend College?: No Did You Have An Individualized Education Program (IIEP): No Did You Have Any Difficulty At School?: No Patient's Education Has Been Impacted by Current Illness: No   CCA Family/Childhood History Family and Relationship History: Family history Marital status: Divorced Divorced, when?: 30 years ago What types of issues is patient dealing with in the relationship?: unknown Additional relationship information: none Does patient  have children?: No  Childhood History:  Childhood History By whom was/is the patient raised?: Both parents Did patient suffer any verbal/emotional/physical/sexual abuse as a child?: No Did patient suffer from severe childhood neglect?: No Has patient ever been sexually abused/assaulted/raped as an adolescent or adult?: No Was the patient ever a victim of a crime or a disaster?: No Witnessed domestic violence?: No Has patient been affected by domestic violence as an adult?: No       CCA Substance Use Alcohol/Drug Use: Alcohol / Drug Use Pain Medications: srr MAR Prescriptions: see MAR Over the Counter: see MAR History of alcohol / drug use?: No history of alcohol / drug abuse                         ASAM's:  Six Dimensions of Multidimensional Assessment  Dimension 1:  Acute Intoxication and/or Withdrawal Potential:      Dimension 2:  Biomedical Conditions and Complications:      Dimension 3:  Emotional, Behavioral, or Cognitive Conditions and Complications:     Dimension 4:  Readiness to Change:     Dimension 5:  Relapse, Continued use, or Continued Problem Potential:     Dimension 6:  Recovery/Living Environment:     ASAM Severity Score:    ASAM Recommended Level of Treatment:     Substance use Disorder (SUD)    Recommendations for Services/Supports/Treatments:    Discharge Disposition:    DSM5 Diagnoses: Patient Active Problem List   Diagnosis Date Noted   Suicidal ideation 07/16/2022   Tardive dyskinesia 07/16/2022   MDD (major depressive disorder), recurrent severe, without psychosis (HCC) 07/16/2022   Depressive disorder 08/02/2021   Hypotension after procedure 06/18/2021   Hx of CABG x 4 06/17/2021   B12 deficiency 10/29/2020   Vitamin D deficiency 10/29/2020   Thyroid goiter 10/29/2020   History of depression 10/29/2020   Osteopenia  of multiple sites 10/29/2020   Coronary artery disease involving native coronary artery of native heart  without angina pectoris 02/18/2020   GAD (generalized anxiety disorder) 01/02/2020   Familial hypercholesterolemia 04/19/2019   Agatston coronary artery calcium score between 100 and 199 08/26/2017   Family history of premature coronary artery disease 08/26/2017   DOE (dyspnea on exertion) 08/26/2017   Fatigue due to excessive exertion 08/26/2017   Hyperlipidemia due to dietary fat intake 08/26/2017   Morton's neuroma of third interspace of right foot 02/19/2017   Pain in right ankle and joints of right foot 02/19/2017   Chronic seasonal allergic rhinitis 04/30/2016     Referrals to Alternative Service(s): Referred to Alternative Service(s):   Place:   Date:   Time:    Referred to Alternative Service(s):   Place:   Date:   Time:    Referred to Alternative Service(s):   Place:   Date:   Time:    Referred to Alternative Service(s):   Place:   Date:   Time:     Doralee Kocak T, Counselor

## 2022-07-16 NOTE — ED Notes (Signed)
Two attempts were made at obtaining blood from patient however they were unsuccesful

## 2022-07-16 NOTE — Telephone Encounter (Signed)
Patient reporting SI, thoughts of overdosing or hanging, but states she doesn't think she will do anything. She said TD is really hard and she is having a lot of movement in her legs. She was told lorazepam had been sent to her pharmacy. She mentioned that she had been taking some Buspar also, though not regularly. She said she was supposed to have stopped it. She is asking for help with TD. I reviewed emergency resources with her.

## 2022-07-16 NOTE — ED Provider Notes (Signed)
[facility] Continuous Assessment Admission H&P  Date: 07/16/22 Patient Name: Heather Lucas MRN: [id number] Chief Complaint: "I have tardive dyskinesia and it's just hard to deal with"  Diagnoses:  Final diagnoses:  Tardive dyskinesia  Suicidal ideation  MDD (major depressive disorder), recurrent severe, without psychosis (HCC)    HPI: Heather Lucas is a 62 y.o. female patient with a past psychiatric history of MDD, GAD, insomnia, panic attacks, and tardive dyskinesia who presented voluntarily and unaccompanied to [facility] with complaints of suicidal ideations and worsening depression.  Patient assessed face-to-face by this provider and chart reviewed on 07/16/22. Counselor Beryle Flock present during assessment. On evaluation, Heather Lucas is seated in assessment area in no acute distress. Patient is alert and oriented x4, cooperative and pleasant. Speech is clear and coherent, normal rate and volume. Eye contact is good. Mood is anxious and depressed with flat and congruent affect. Patient has constant tapping of right foot throughout assessment. Thought process is coherent with logical thought content. Patient reports suicidal ideations and states "I thought about it but I'm not planning on it. It's not something I would act on, I just wish I wasn't here. It would be easier to deal with if I wasn't here." Patient states she has had thoughts of intentionally overdosing on her medications or hanging herself. Patient states "I understand pills don't always work" and states she had a friend who hung himself 7-8 years ago. Patient states she went to her medical doctor today and "answered a question that I had thought about committing suicide and he sent me here." Per chart review, patient called [facility] today "reporting SI, thoughts of overdosing or hanging, but states she doesn't think she will do anything. She said TD is really hard and she is having a lot of  movement in her legs. She was told lorazepam had been sent to her pharmacy. She mentioned that she had been taking some Buspar also, though not regularly. She said she was supposed to have stopped it. She is asking for help with TD." Patient was then instructed to come to [facility]. Patient denies homicidal ideations. Patient denies a history of suicide attempts or self-harm. Patient denies past psychiatric hospitalizations. Patient denies auditory and visual hallucinations. Patient denies symptoms of paranoia. Patient is able to converse coherently with goal-directed thoughts and no distractibility or preoccupation. Objectively, there is no evidence of psychosis/mania, delusional thinking, or indication that patient is responding to internal or external stimuli.  Patient reports increased sleep and states on the weekends "I move from the bed to the sofa and that's where I stay til I go to work Monday morning." Patient reports fair appetite although states she recently lost 25-30lb while taking Wellbutrin because it caused her to have an upset stomach. Patient states Wellbutrin has already been discontinued. Patient lives alone in [place] and denies access to weapons/firearms. Patient is employed in home [employer]. Patient denies use of alcohol or illicit substances. Patient states she receives outpatient psychiatric services at [facility] and sees Yvette Rack, NP who currently prescribes her Clonazepam 1mg  BID and Ingrezza 40mg  at bedtime. Patient states she also recently began seeing a counselor named Thayer Ohm. Per chart review, patient discussed with [facility] this week increasing Ingrezza because of "leg shaking" and switching from clonazepam to lorazepam. Patient reports in addition to her leg movements, she has drooling and increased anxiety. Patient reports increased depressive symptoms related to her tardive dyskinesia.  Patient states she  is neglecting her personal hygiene and not doing household chores. Patient states her tardive dyskinesia makes it difficult to get to work. Patient shares that she has 3 close friends as support "but no one to hang out with." Patient shares that she had open heart surgery in February 2022 and currently takes Plavix, Zetia, Proamatine, and Crestor.   Patient offered support and encouragement. Discussed with patient increasing Ingrezza to 60mg  daily, starting lorazepam 0.5mg  TID PRN anxiety, and recommendations for inpatient psychiatric treatment. Discussed admission to the continuous observation unit while awaiting inpatient placement. Patient is in agreement with plan of care.  Total Time spent with patient: 1 hour  Musculoskeletal  Strength & Muscle Tone: within normal limits Gait & Station: normal Patient leans: N/A  Psychiatric Specialty Exam  Presentation General Appearance:  Appropriate for Environment; Casual  Eye Contact: Good  Speech: Clear and Coherent; Normal Rate  Speech Volume: Normal  Handedness: Right   Mood and Affect  Mood: Depressed; Anxious  Affect: Congruent; Flat   Thought Process  Thought Processes: Coherent; Goal Directed  Descriptions of Associations:Intact  Orientation:Full (Time, Place and Person)  Thought Content:Logical  Diagnosis of Schizophrenia or Schizoaffective disorder in past: No   Hallucinations:Hallucinations: None  Ideas of Reference:None  Suicidal Thoughts:Suicidal Thoughts: Yes, Passive SI Passive Intent and/or Plan: Without Intent  Homicidal Thoughts:Homicidal Thoughts: No   Sensorium  Memory: Immediate Good; Recent Good; Remote Good  Judgment: Fair  Insight: Fair   Art therapist  Concentration: Good  Attention Span: Good  Recall: Good  Fund of Knowledge: Good  Language: Good   Psychomotor Activity  Psychomotor Activity: Psychomotor Activity: Restlessness (Constant tapping of right  foot)   Assets  Assets: Communication Skills; Desire for Improvement; Financial Resources/Insurance; Housing; Leisure Time; Physical Health; Resilience; Transportation   Sleep  Sleep: Sleep: -- ("Too much") Number of Hours of Sleep: 0 ("Too much")   Nutritional Assessment (For OBS and FBC admissions only) Has the patient had a weight loss or gain of 10 pounds or more in the last 3 months?: Yes (Patient reports she lost 20-30 lb while taking Wellbutrin (caused upset stomach), medication was stopped) Has the patient had a decrease in food intake/or appetite?: No Does the patient have dental problems?: No Does the patient have eating habits or behaviors that may be indicators of an eating disorder including binging or inducing vomiting?: No Has the patient recently lost weight without trying?: 3 Has the patient been eating poorly because of a decreased appetite?: 0 (Patient reports she lost 20-30 lb while taking Wellbutrin (caused upset stomach), medication was stopped) Malnutrition Screening Tool Score: 3 Nutritional Assessment Referrals: Medication/Tx changes    Physical Exam Vitals and nursing note reviewed.  Constitutional:      General: She is not in acute distress.    Appearance: Normal appearance. She is not ill-appearing.  HENT:     Head: Normocephalic and atraumatic.     Nose: Nose normal.  Eyes:     General:        Right eye: No discharge.        Left eye: No discharge.     Conjunctiva/sclera: Conjunctivae normal.  Cardiovascular:     Rate and Rhythm: Normal rate.  Pulmonary:     Effort: Pulmonary effort is normal. No respiratory distress.  Musculoskeletal:        General: Normal range of motion.     Cervical back: Normal range of motion.  Skin:    General:  Skin is warm and dry.  Neurological:     General: No focal deficit present.     Mental Status: She is alert and oriented to person, place, and time. Mental status is at baseline.  Psychiatric:         Attention and Perception: Attention and perception normal.        Mood and Affect: Mood is anxious and depressed. Affect is flat.        Speech: Speech normal.        Behavior: Behavior normal. Behavior is cooperative.        Thought Content: Thought content is not paranoid or delusional. Thought content includes suicidal ideation. Thought content does not include homicidal ideation. Thought content does not include homicidal or suicidal plan.        Cognition and Memory: Cognition and memory normal.        Judgment: Judgment normal.    Review of Systems  Constitutional: Negative.   HENT: Negative.    Eyes: Negative.   Respiratory: Negative.    Cardiovascular: Negative.   Gastrointestinal: Negative.   Genitourinary: Negative.   Musculoskeletal: Negative.   Skin: Negative.   Neurological: Negative.   Endo/Heme/Allergies: Negative.   Psychiatric/Behavioral:  Positive for depression and suicidal ideas. Negative for hallucinations, memory loss and substance abuse. The patient is nervous/anxious. The patient does not have insomnia.     Blood pressure (!) 141/76, pulse 85, temperature 98 F (36.7 C), temperature source Oral, resp. rate 20, SpO2 98 %. There is no height or weight on file to calculate BMI.  Past Psychiatric History: MDD, GAD, insomnia, panic attacks, and tardive dyskinesia  Is the patient at risk to self? Yes  Has the patient been a risk to self in the past 6 months? Yes .    Has the patient been a risk to self within the distant past? Yes   Is the patient a risk to others? No   Has the patient been a risk to others in the past 6 months? No   Has the patient been a risk to others within the distant past? No   Past Medical History:  Past Medical History:  Diagnosis Date   Borderline hyperlipidemia    as of 10/2016: TC 134, TG 90, HDL 51, LDL 95 (PCP recently increase rosuvastatin dose from 10 to 20 mg)   Borderline hypertension    Coronary artery disease involving  native coronary artery of native heart without angina pectoris 03/01/2020   [facility]:; Dr. Adin Hector:  multivessel CAD => 50% pLAD & 60% mLAD, 70% pLCx & 90% mLCx,  70% mid RCA => CABG x 4   Family history of premature coronary artery disease 08/26/2017   Hx of CABG x 4 03/19/2020   [facility], [place], [place] -> CABG x4 (LIMA-LAD, SVG-OM1, SVG-OM2, SVG-dRCA   Hyperlipidemia due to dietary fat intake 08/26/2017   Family History:  Family History  Problem Relation Age of Onset   Heart attack Mother 25       Recently died; age 27.   Cancer Father    Heart attack Brother    CAD Brother    Heart attack Maternal Grandmother    Diabetes Maternal Grandmother    Colon cancer Paternal Grandmother    Other Paternal Grandfather        Circulatory problems   Social History:  Social History   Tobacco Use   Smoking status: Never   Smokeless tobacco:  Never  Substance Use Topics   Alcohol use: Not Currently   Drug use: Never   Last Labs:  Admission on 07/16/2022  Component Date Value Ref Range Status   Color, Urine 07/16/2022 YELLOW  YELLOW Final   APPearance 07/16/2022 CLEAR  CLEAR Final   Specific Gravity, Urine 07/16/2022 1.009  1.005 - 1.030 Final   pH 07/16/2022 6.0  5.0 - 8.0 Final   Glucose, UA 07/16/2022 NEGATIVE  NEGATIVE mg/dL Final   Hgb urine dipstick 07/16/2022 NEGATIVE  NEGATIVE Final   Bilirubin Urine 07/16/2022 NEGATIVE  NEGATIVE Final   Ketones, ur 07/16/2022 NEGATIVE  NEGATIVE mg/dL Final   Protein, ur 72/53/6644 NEGATIVE  NEGATIVE mg/dL Final   Nitrite 03/47/4259 NEGATIVE  NEGATIVE Final   Leukocytes,Ua 07/16/2022 SMALL (A)  NEGATIVE Final   RBC / HPF 07/16/2022 0-5  0 - 5 RBC/hpf Final   WBC, UA 07/16/2022 0-5  0 - 5 WBC/hpf Final   Bacteria, UA 07/16/2022 NONE SEEN  NONE SEEN Final   Squamous Epithelial / HPF 07/16/2022 0-5  0 - 5 /HPF Final   Mucus 07/16/2022 PRESENT   Final   Performed at [facility], [address] [place], [place] [address]   Preg Test, Ur 07/16/2022 Negative  Negative Final   POC Amphetamine UR 07/16/2022 None Detected  NONE DETECTED (Cut Off Level 1000 ng/mL) Final   POC Secobarbital (BAR) 07/16/2022 None Detected  NONE DETECTED (Cut Off Level 300 ng/mL) Final   POC Buprenorphine (BUP) 07/16/2022 None Detected  NONE DETECTED (Cut Off Level 10 ng/mL) Final   POC Oxazepam (BZO) 07/16/2022 Positive (A)  NONE DETECTED (Cut Off Level 300 ng/mL) Final   POC Cocaine UR 07/16/2022 None Detected  NONE DETECTED (Cut Off Level 300 ng/mL) Final   POC Methamphetamine UR 07/16/2022 None Detected  NONE DETECTED (Cut Off Level 1000 ng/mL) Final   POC Morphine 07/16/2022 None Detected  NONE DETECTED (Cut Off Level 300 ng/mL) Final   POC Methadone UR 07/16/2022 None Detected  NONE DETECTED (Cut Off Level 300 ng/mL) Final   POC Oxycodone UR 07/16/2022 None Detected  NONE DETECTED (Cut Off Level 100 ng/mL) Final   POC Marijuana UR 07/16/2022 None Detected  NONE DETECTED (Cut Off Level 50 ng/mL) Final    Allergies: Patient has no known allergies.  Medications:  Facility Ordered Medications  Medication   acetaminophen (TYLENOL) tablet 650 mg   alum & mag hydroxide-simeth (MAALOX/MYLANTA) 200-200-20 MG/5ML suspension 30 mL   magnesium hydroxide (MILK OF MAGNESIA) suspension 30 mL   hydrOXYzine (ATARAX) tablet 25 mg   traZODone (DESYREL) tablet 50 mg   valbenazine (INGREZZA) capsule 60 mg   midodrine (PROAMATINE) tablet 2.5 mg   [START ON 07/17/2022] clopidogrel (PLAVIX) tablet 75 mg   [START ON 07/17/2022] rosuvastatin (CRESTOR) tablet 20 mg   [START ON 07/17/2022] ezetimibe (ZETIA) tablet 10 mg   LORazepam (ATIVAN) tablet 0.5 mg   PTA Medications  Medication Sig   rosuvastatin (CRESTOR) 20 MG tablet Take 20 mg by mouth daily.   clopidogrel (PLAVIX) 75 MG tablet Take 75 mg by mouth daily.   ergocalciferol (VITAMIN D2) 1.25 MG (50000 UT) capsule Take 50,000 Units by mouth  daily.   Sennosides 15 MG TABS Take 15 mg by mouth daily. Takes 8 tablets a night   ezetimibe (ZETIA) 10 MG tablet Take 1 tablet (10 mg total) by mouth daily.   midodrine (PROAMATINE) 5 MG tablet Take 5 mg  in the morning  and take  2.5 mg ( 1/2 tablet ) in the evening.   clonazePAM (KLONOPIN) 1 MG tablet TAKE ONE TABLET BY MOUTH TWICE DAILY   valbenazine (INGREZZA) 40 MG capsule Take 1 capsule (40 mg total) by mouth daily.      Medical Decision Making  Heather Lucas was admitted to [facility] continuous assessment unit for suicidal ideation, MDD (major depressive disorder), recurrent severe, without psychosis (HCC), crisis management, and stabilization. Routine labs ordered, which include Lab Orders         CBC with Differential/Platelet         Comprehensive metabolic panel         Hemoglobin A1c         Magnesium         Ethanol         Lipid panel         TSH         Prolactin         Urinalysis, Routine w reflex microscopic -Urine, Clean Catch         POC urine preg, ED         POCT Urine Drug Screen - (I-Screen)    Medication Management: Medications started Meds ordered this encounter  Medications   acetaminophen (TYLENOL) tablet 650 mg   alum & mag hydroxide-simeth (MAALOX/MYLANTA) 200-200-20 MG/5ML suspension 30 mL   magnesium hydroxide (MILK OF MAGNESIA) suspension 30 mL   hydrOXYzine (ATARAX) tablet 25 mg   traZODone (DESYREL) tablet 50 mg   valbenazine (INGREZZA) capsule 60 mg   midodrine (PROAMATINE) tablet 2.5 mg   clopidogrel (PLAVIX) tablet 75 mg   rosuvastatin (CRESTOR) tablet 20 mg   ezetimibe (ZETIA) tablet 10 mg   LORazepam (ATIVAN) tablet 0.5 mg    Will maintain observation checks every 15 minutes for safety. Psychosocial education regarding relapse prevention and self-care; social and communication    Recommendations  Based on my evaluation the patient does not appear to have an emergency medical condition.  -Recommend  inpatient psychiatric treatment; per Rona Ravens, [facility], patient has been tentatively accepted to [facility] tonight 07/16/22 pending labs, vol consent faxed to [phone], accepting provider is Dr. Toni Amend. -Increase Ingrezza to 60mg  daily -Start Ativan 0.5mg  TID PRN anxiety -PRN orders also placed for Tylenol, Maalox, hydroxyzine, MOM, and trazodone  Sunday Corn, NP 07/16/22  6:22 PM

## 2022-07-16 NOTE — Telephone Encounter (Signed)
Let's call her back and refer to the [facility]. I spoke with her PCP today who is also concerned about her mood symptoms.

## 2022-07-16 NOTE — ED Notes (Signed)
Safe Transport requested.to [facility]. 

## 2022-07-16 NOTE — ED Notes (Signed)
Patient is currently lying in bed no distress noted, will continue to monitor patient for safety

## 2022-07-16 NOTE — ED Notes (Signed)
Unable to give the patients Midodrine as we do not have it in this facility a message has been sent to pharmacy team.

## 2022-07-16 NOTE — Telephone Encounter (Signed)
Patient lvm stating that she has being experiencing depression really bad today and having some suicidal thoughts. Ph: [phone]

## 2022-07-16 NOTE — ED Notes (Signed)
Pt A&o x 4,  anxious, calm & cooperative.  Pending lab results & report to [facility].  Pt is Voluntary.  Pt resting at present.  Monitoring for safety.

## 2022-07-16 NOTE — ED Notes (Signed)
Report called to RN Matt, [facility], [facility]U [id number].  Librarian, academic.

## 2022-07-16 NOTE — Telephone Encounter (Signed)
Notified patient to go to [facility].

## 2022-07-16 NOTE — Progress Notes (Signed)
   07/16/22 1441  [facility] Triage Screening (Walk-ins at [facility] only)  How Did You Hear About [place]? Other (Comment)  What Is the Reason for Your Visit/Call Today? Heather Lucas is a 62 year old female presenting to [facility] voluntarily with chief complaint of SI no plan or intention to act on them per her report, however patient called NP at [facility] and informed them that she was having SI with plant to OD or hang herself. Pt reports stressors to include TD for the past year and reports that it is hard to deal with it. Pt denies HI, AVH and substance use. Pt has not acted on thoughts and denies history of suicide attempts. Pt reports worsening depression and SI for the past couple of months. Pt reports she has not motivation to do anything so she sits at home which concerns her friends.  How Long Has This Been Causing You Problems? 1-6 months  Have You Recently Had Any Thoughts About Hurting Yourself? Yes  How long ago did you have thoughts about hurting yourself? past couple of months  Are You Planning to Commit Suicide/Harm Yourself At This time? Yes  Have you Recently Had Thoughts About Hurting Someone [name]? No  Are You Planning To Harm Someone At This Time? No  Are you currently experiencing any auditory, visual or other hallucinations? No  Have You Used Any Alcohol or Drugs in the Past 24 Hours? No  Do you have any current medical co-morbidities that require immediate attention? No  Clinician description of patient physical appearance/behavior: TD  What Do You Feel Would Help You the Most Today? Treatment for Depression or other mood problem  If access to [facility] was not available, would you have sought care in the Emergency Department? No  Determination of Need Urgent (48 hours)  Options For Referral Medication Management;Outpatient Therapy;Inpatient Hospitalization

## 2022-07-16 NOTE — ED Notes (Signed)
Patient is increasingly anxious due to tardive dyskinesia symptoms she is experiencing she has asked for a dose of her prn ativan

## 2022-07-17 ENCOUNTER — Other Ambulatory Visit: Payer: Self-pay

## 2022-07-17 ENCOUNTER — Encounter: Payer: Self-pay | Admitting: Behavioral Health

## 2022-07-17 ENCOUNTER — Inpatient Hospital Stay
Admission: AD | Admit: 2022-07-17 | Discharge: 2022-07-21 | DRG: 885 | Disposition: A | Discharge status: Home / Self Care | Payer: BC Managed Care – PPO | Source: Other Acute Inpatient Hospital | Attending: Psychiatry | Admitting: Psychiatry

## 2022-07-17 ENCOUNTER — Inpatient Hospital Stay: Payer: BC Managed Care – PPO

## 2022-07-17 DIAGNOSIS — Z79899 Other long term (current) drug therapy: Secondary | ICD-10-CM | POA: Diagnosis not present

## 2022-07-17 DIAGNOSIS — Z7902 Long term (current) use of antithrombotics/antiplatelets: Secondary | ICD-10-CM | POA: Diagnosis not present

## 2022-07-17 DIAGNOSIS — R45851 Suicidal ideations: Secondary | ICD-10-CM | POA: Diagnosis not present

## 2022-07-17 DIAGNOSIS — Z951 Presence of aortocoronary bypass graft: Secondary | ICD-10-CM

## 2022-07-17 DIAGNOSIS — G4700 Insomnia, unspecified: Secondary | ICD-10-CM | POA: Diagnosis present

## 2022-07-17 DIAGNOSIS — F411 Generalized anxiety disorder: Secondary | ICD-10-CM | POA: Diagnosis not present

## 2022-07-17 DIAGNOSIS — F332 Major depressive disorder, recurrent severe without psychotic features: Principal | ICD-10-CM | POA: Diagnosis present

## 2022-07-17 DIAGNOSIS — Z8249 Family history of ischemic heart disease and other diseases of the circulatory system: Secondary | ICD-10-CM

## 2022-07-17 DIAGNOSIS — G2401 Drug induced subacute dyskinesia: Secondary | ICD-10-CM | POA: Diagnosis present

## 2022-07-17 DIAGNOSIS — R4182 Altered mental status, unspecified: Secondary | ICD-10-CM | POA: Diagnosis not present

## 2022-07-17 LAB — VITAMIN B12: Vitamin B-12: 274 pg/mL (ref 180–914)

## 2022-07-17 LAB — TSH: TSH: 1.447 u[IU]/mL (ref 0.350–4.500)

## 2022-07-17 MED ORDER — MAGNESIUM HYDROXIDE 400 MG/5ML PO SUSP
30.0000 mL | Freq: Every day | ORAL | Status: DC | PRN
  Administered 2022-07-20: 30 mL via ORAL
  Filled 2022-07-17 (×3): qty 30

## 2022-07-17 MED ORDER — ZIPRASIDONE MESYLATE 20 MG IM SOLR: 20.0000 mg | Freq: Two times a day (BID) | INTRAMUSCULAR | Status: DC | PRN

## 2022-07-17 MED ORDER — HYDROXYZINE HCL 25 MG PO TABS
25.0000 mg | ORAL_TABLET | Freq: Three times a day (TID) | ORAL | Status: DC | PRN
  Administered 2022-07-17 – 2022-07-21 (×8): 25 mg via ORAL
  Filled 2022-07-17 (×9): qty 1

## 2022-07-17 MED ORDER — ROSUVASTATIN CALCIUM 20 MG PO TABS
20.0000 mg | ORAL_TABLET | Freq: Every day | ORAL | Status: DC
  Administered 2022-07-17 – 2022-07-21 (×5): 20 mg via ORAL
  Filled 2022-07-17 (×5): qty 1

## 2022-07-17 MED ORDER — ACETAMINOPHEN 325 MG PO TABS
650.0000 mg | ORAL_TABLET | Freq: Four times a day (QID) | ORAL | Status: DC | PRN
  Administered 2022-07-18: 650 mg via ORAL
  Filled 2022-07-17: qty 2

## 2022-07-17 MED ORDER — TRAZODONE HCL 50 MG PO TABS
50.0000 mg | ORAL_TABLET | Freq: Every evening | ORAL | Status: DC | PRN
  Administered 2022-07-17 – 2022-07-20 (×3): 50 mg via ORAL
  Filled 2022-07-17 (×3): qty 1

## 2022-07-17 MED ORDER — VALBENAZINE TOSYLATE 60 MG PO CAPS
60.0000 mg | ORAL_CAPSULE | Freq: Every day | ORAL | Status: DC
  Filled 2022-07-17: qty 1

## 2022-07-17 MED ORDER — ALUM & MAG HYDROXIDE-SIMETH 200-200-20 MG/5ML PO SUSP: 30.0000 mL | ORAL | Status: DC | PRN

## 2022-07-17 MED ORDER — LORAZEPAM 0.5 MG PO TABS
0.5000 mg | ORAL_TABLET | Freq: Three times a day (TID) | ORAL | Status: DC | PRN
  Administered 2022-07-17 – 2022-07-20 (×4): 0.5 mg via ORAL
  Filled 2022-07-17 (×4): qty 1

## 2022-07-17 MED ORDER — EZETIMIBE 10 MG PO TABS
10.0000 mg | ORAL_TABLET | Freq: Every day | ORAL | Status: DC
  Administered 2022-07-17 – 2022-07-21 (×5): 10 mg via ORAL
  Filled 2022-07-17 (×5): qty 1

## 2022-07-17 MED ORDER — VORTIOXETINE HBR 5 MG PO TABS
5.0000 mg | ORAL_TABLET | Freq: Every day | ORAL | Status: DC
  Administered 2022-07-17 – 2022-07-21 (×5): 5 mg via ORAL
  Filled 2022-07-17 (×5): qty 1

## 2022-07-17 MED ORDER — LORAZEPAM 1 MG PO TABS: 1.0000 mg | ORAL_TABLET | ORAL | Status: DC | PRN

## 2022-07-17 MED ORDER — MIDODRINE HCL 5 MG PO TABS
5.0000 mg | ORAL_TABLET | Freq: Two times a day (BID) | ORAL | Status: DC
  Administered 2022-07-18 – 2022-07-21 (×7): 5 mg via ORAL
  Filled 2022-07-17 (×9): qty 1

## 2022-07-17 MED ORDER — CLOPIDOGREL BISULFATE 75 MG PO TABS
75.0000 mg | ORAL_TABLET | Freq: Every day | ORAL | Status: DC
  Administered 2022-07-17 – 2022-07-21 (×5): 75 mg via ORAL
  Filled 2022-07-17 (×5): qty 1
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
## 2022-07-17 NOTE — Tx Team (Signed)
Initial Treatment Plan 07/17/2022 1:01 AM [name] [id number]    PATIENT STRESSORS: Health problems     PATIENT STRENGTHS: Ability for insight  Average or above average intelligence  Motivation for treatment/growth    PATIENT IDENTIFIED PROBLEMS: Depression  Suicidal Ideation  Anxiety                  DISCHARGE CRITERIA:  Improved stabilization in mood, thinking, and/or behavior Verbal commitment to aftercare and medication compliance  PRELIMINARY DISCHARGE PLAN: Outpatient therapy Return to previous living arrangement  PATIENT/FAMILY INVOLVEMENT: This treatment plan has been presented to and reviewed with the patient, Heather Lucas.  The patient has been given the opportunity to ask questions and make suggestions.  Elmyra Ricks, RN 07/17/2022, 1:01 AM

## 2022-07-17 NOTE — Group Note (Signed)
[facility] LCSW Group Therapy Note   Group Date: 07/17/2022 Start Time: 1310 End Time: 1400   Type of Therapy/Topic:  Group Therapy:  Balance in Life  Participation Level:  Did Not Attend   Description of Group:    This group will address the concept of balance and how it feels and looks when one is unbalanced. Patients will be encouraged to process areas in their lives that are out of balance, and identify reasons for remaining unbalanced. Facilitators will guide patients utilizing problem- solving interventions to address and correct the stressor making their life unbalanced. Understanding and applying boundaries will be explored and addressed for obtaining  and maintaining a balanced life. Patients will be encouraged to explore ways to assertively make their unbalanced needs known to significant others in their lives, using other group members and facilitator for support and feedback.  Therapeutic Goals: Patient will identify two or more emotions or situations they have that consume much of in their lives. Patient will identify signs/triggers that life has become out of balance:  Patient will identify two ways to set boundaries in order to achieve balance in their lives:  Patient will demonstrate ability to communicate their needs through discussion and/or role plays  Summary of Patient Progress: Pt declined to attend group despite encouragement from this CSW.    Therapeutic Modalities:   Cognitive Behavioral Therapy Solution-Focused Therapy Assertiveness Training   Rowland Ericsson R Amarea Macdowell, LCSW 

## 2022-07-17 NOTE — H&P (Signed)
Psychiatric Admission Assessment Adult  Patient Identification: Heather Lucas MRN:  [id number] Date of Evaluation:  07/17/2022 Chief Complaint:  MDD (major depressive disorder), recurrent severe, without psychosis (HCC) [F33.2] Principal Diagnosis: MDD (major depressive disorder), recurrent severe, without psychosis (HCC) Diagnosis:  Principal Problem:   MDD (major depressive disorder), recurrent severe, without psychosis (HCC) Active Problems:   Hx of CABG x 4   GAD (generalized anxiety disorder)   Tardive dyskinesia  History of Present Illness: Patient seen and chart reviewed.  62 year old woman referred for inpatient treatment because of reports of suicidal ideation.  Patient tells me "I was thinking about suicide".  She is a little vague about the time course of events saying that she has chronic anxiety and depression.  She said recently she had found herself at home after work lying on the sofa feeling hopeless and having thoughts about how everyone would be better off with her dead.  She denied having made any plan or having any intent to act on it.  Patient denies hallucinations or psychotic symptoms.  She has chronic anxiety and depression and hopelessness which she feels has probably been a little worse over the last couple months.  Denies any single specific new stressor.  Says that she feels she has little to live for.  She does her work every day comes home has little contact with other people and no other interests it sounds like.  Denies alcohol or drug abuse.  She is seeing a therapist and a Publishing rights manager for medicine management.  Not currently on any medicine for depression there being concerns about past side effects.  Patient's tardive dyskinesia has been a major stress for her.  She has ripped ported feeling hopeless about it in the past.  Had been started on Ingrezza with some response but unfortunately felt like it was making her anxiety worse. Associated  Signs/Symptoms: Depression Symptoms:  depressed mood, anhedonia, psychomotor retardation, difficulty concentrating, suicidal thoughts without plan, (Hypo) Manic Symptoms:   None Anxiety Symptoms:  Excessive Worry, Psychotic Symptoms:   None PTSD Symptoms: Negative Total Time spent with patient: 45 minutes  Past Psychiatric History: Patient has a history of longstanding treatment of anxiety and depression symptoms with medication management going back to her 20s.  She has been on multiple medications in the past.  She reports that she has gotten benefit from some of them most notably from some SSRIs.  She recalls Trintellix having been particularly helpful.  Patient had recently been tried on Wellbutrin but it had made her very sick to her stomach.  She had been living in [place] until last year and during that time was prescribed Abilify and Zyprexa.  She seems to develop tardive dyskinesia while taking those medicines and this is now a source of ongoing anxiety for her.  No history of hospitalizations no history of suicide attempts no history of substance abuse  Is the patient at risk to self? Yes.    Has the patient been a risk to self in the past 6 months? Yes.    Has the patient been a risk to self within the distant past? No.  Is the patient a risk to others? No.  Has the patient been a risk to others in the past 6 months? No.  Has the patient been a risk to others within the distant past? No.   [place] Scale:  Flowsheet Row Admission (Current) from 07/17/2022 in [facility] ED from 07/16/2022 in [facility]  C-SSRS RISK CATEGORY Low Risk Moderate Risk        Prior Inpatient Therapy: No. If yes, describe none Prior Outpatient Therapy: Yes.   If yes, describe longstanding outpatient treatment for anxiety and depression  Alcohol Screening: 1. How often do you have a drink containing alcohol?: Never 2. How many drinks containing  alcohol do you have on a typical day when you are drinking?: 1 or 2 3. How often do you have six or more drinks on one occasion?: Never AUDIT-C Score: 0 4. How often during the last year have you found that you were not able to stop drinking once you had started?: Never 5. How often during the last year have you failed to do what was normally expected from you because of drinking?: Never 6. How often during the last year have you needed a first drink in the morning to get yourself going after a heavy drinking session?: Never 7. How often during the last year have you had a feeling of guilt of remorse after drinking?: Never 8. How often during the last year have you been unable to remember what happened the night before because you had been drinking?: Never 9. Have you or someone else been injured as a result of your drinking?: No 10. Has a relative or friend or a doctor or another health worker been concerned about your drinking or suggested you cut down?: No Alcohol Use Disorder Identification Test Final Score (AUDIT): 0 Substance Abuse History in the last 12 months:  No. Consequences of Substance Abuse: Negative Previous Psychotropic Medications: Yes  Psychological Evaluations: Yes  Past Medical History:  Past Medical History:  Diagnosis Date   Borderline hyperlipidemia    as of 10/2016: TC 134, TG 90, HDL 51, LDL 95 (PCP recently increase rosuvastatin dose from 10 to 20 mg)   Borderline hypertension    Coronary artery disease involving native coronary artery of native heart without angina pectoris 03/01/2020   [facility]:; Dr. Adin Hector:  multivessel CAD => 50% pLAD & 60% mLAD, 70% pLCx & 90% mLCx,  70% mid RCA => CABG x 4   Family history of premature coronary artery disease 08/26/2017   Hx of CABG x 4 03/19/2020   [facility], [place], [place] -> CABG x4 (LIMA-LAD, SVG-OM1, SVG-OM2, SVG-dRCA   Hyperlipidemia due to dietary  fat intake 08/26/2017    Past Surgical History:  Procedure Laterality Date   CORONARY ARTERY BYPASS GRAFT  03/2020   [facility], [place], [place] -> reportedly CABG x4   GXT/ETT  02/23/2020   From [facility] ([facility]:: 9:30 min (Stage 3). 10.8 METS 80% MPHR; NO CP but + DOE. -> 1-2 mm Inf De[pression & AVR STE = c/w Ischemia   LAPAROSCOPIC CHOLECYSTECTOMY  1995   LEFT HEART CATH AND CORONARY ANGIOGRAPHY  03/01/2020   [facility]; Dr. Adin Hector:  multivessel CAD => 50% pLAD & 60% mLAD, 70% pLCx & 90% mLCx,  70% mid RCA => CABG   NM MYOVIEW LTD  02/2018   Clearly appears abnormal, referred for [facility], [place]   TRANSTHORACIC ECHOCARDIOGRAM  02/16/2020   [facility] ([facility]:  a) Echo 02/16/2020: EF 60% with mild MR and TR.; b) Post Op Limited Echo: EF 55-60%.  Normal wall motion.  Normal RV.  Mild RA dilation.  No pericardial effusion.  Left pleural effusion noted.  TRANSTHORACIC ECHOCARDIOGRAM  07/2021   ([facility])  EF 65 to 70%.  No RWMA other than possible septal motion.  Mild MAC.  Aortic sclerosis.  Otherwise normal.   Family History:  Family History  Problem Relation Age of Onset   Heart attack Mother 80       Recently died; age 48.   Cancer Father    Heart attack Brother    CAD Brother    Heart attack Maternal Grandmother    Diabetes Maternal Grandmother    Colon cancer Paternal Grandmother    Other Paternal Grandfather        Circulatory problems   Family Psychiatric  History: Father with alcohol problems Tobacco Screening:  Social History   Tobacco Use  Smoking Status Never  Smokeless Tobacco Never    [facility] Tobacco Counseling     Are you interested in Tobacco Cessation Medications?  N/A, patient does not use tobacco products Counseled patient on smoking cessation:  N/A, patient does not use tobacco  products Reason Tobacco Screening Not Completed: No value filed.       Social History:  Social History   Substance and Sexual Activity  Alcohol Use Not Currently     Social History   Substance and Sexual Activity  Drug Use Never    Additional Social History:                           Allergies:  No Known Allergies Lab Results:  Results for orders placed or performed during the hospital encounter of 07/16/22 (from the past 48 hour(s))  Urinalysis, Routine w reflex microscopic -Urine, Clean Catch     Status: Abnormal   Collection Time: 07/16/22  3:55 PM  Result Value Ref Range   Color, Urine YELLOW YELLOW   APPearance CLEAR CLEAR   Specific Gravity, Urine 1.009 1.005 - 1.030   pH 6.0 5.0 - 8.0   Glucose, UA NEGATIVE NEGATIVE mg/dL   Hgb urine dipstick NEGATIVE NEGATIVE   Bilirubin Urine NEGATIVE NEGATIVE   Ketones, ur NEGATIVE NEGATIVE mg/dL   Protein, ur NEGATIVE NEGATIVE mg/dL   Nitrite NEGATIVE NEGATIVE   Leukocytes,Ua SMALL (A) NEGATIVE   RBC / HPF 0-5 0 - 5 RBC/hpf   WBC, UA 0-5 0 - 5 WBC/hpf   Bacteria, UA NONE SEEN NONE SEEN   Squamous Epithelial / HPF 0-5 0 - 5 /HPF   Mucus PRESENT     Comment: Performed at [facility], [address] [place], [place] [address]  POC urine preg, ED     Status: Normal   Collection Time: 07/16/22  3:58 PM  Result Value Ref Range   Preg Test, Ur Negative Negative  POCT Urine Drug Screen - (I-Screen)     Status: Abnormal   Collection Time: 07/16/22  3:58 PM  Result Value Ref Range   POC Amphetamine UR None Detected NONE DETECTED (Cut Off Level 1000 ng/mL)   POC Secobarbital (BAR) None Detected NONE DETECTED (Cut Off Level 300 ng/mL)   POC Buprenorphine (BUP) None Detected NONE DETECTED (Cut Off Level 10 ng/mL)   POC Oxazepam (BZO) Positive (A) NONE DETECTED (Cut Off Level 300 ng/mL)   POC Cocaine UR None Detected NONE DETECTED (Cut Off Level 300 ng/mL)   POC Methamphetamine UR None Detected NONE DETECTED (Cut  Off Level 1000 ng/mL)   POC Morphine None Detected NONE DETECTED (Cut Off Level 300 ng/mL)   POC Methadone UR None  Detected NONE DETECTED (Cut Off Level 300 ng/mL)   POC Oxycodone UR None Detected NONE DETECTED (Cut Off Level 100 ng/mL)   POC Marijuana UR None Detected NONE DETECTED (Cut Off Level 50 ng/mL)  CBC with Differential/Platelet     Status: None   Collection Time: 07/16/22  6:36 PM  Result Value Ref Range   WBC 5.9 4.0 - 10.5 K/uL   RBC 4.40 3.87 - 5.11 MIL/uL   Hemoglobin 13.5 12.0 - 15.0 g/dL   HCT 16.1 09.6 - 04.5 %   MCV 92.3 80.0 - 100.0 fL   MCH 30.7 26.0 - 34.0 pg   MCHC 33.3 30.0 - 36.0 g/dL   RDW 40.9 81.1 - 91.4 %   Platelets 209 150 - 400 K/uL   nRBC 0.0 0.0 - 0.2 %   Neutrophils Relative % 60 %   Neutro Abs 3.6 1.7 - 7.7 K/uL   Lymphocytes Relative 30 %   Lymphs Abs 1.8 0.7 - 4.0 K/uL   Monocytes Relative 8 %   Monocytes Absolute 0.4 0.1 - 1.0 K/uL   Eosinophils Relative 1 %   Eosinophils Absolute 0.0 0.0 - 0.5 K/uL   Basophils Relative 1 %   Basophils Absolute 0.0 0.0 - 0.1 K/uL   Immature Granulocytes 0 %   Abs Immature Granulocytes 0.02 0.00 - 0.07 K/uL    Comment: Performed at [facility], [address] [place], [place] [address]  Comprehensive metabolic panel     Status: Abnormal   Collection Time: 07/16/22  6:36 PM  Result Value Ref Range   Sodium 141 135 - 145 mmol/L   Potassium 4.0 3.5 - 5.1 mmol/L   Chloride 105 98 - 111 mmol/L   CO2 25 22 - 32 mmol/L   Glucose, Bld 167 (H) 70 - 99 mg/dL    Comment: Glucose reference range applies only to samples taken after fasting for at least 8 hours.   BUN 10 8 - 23 mg/dL   Creatinine, Ser 6.21 (H) 0.44 - 1.00 mg/dL   Calcium 9.1 8.9 - 30.8 mg/dL   Total Protein 5.8 (L) 6.5 - 8.1 g/dL   Albumin 3.9 3.5 - 5.0 g/dL   AST 24 15 - 41 U/L   ALT 20 0 - 44 U/L   Alkaline Phosphatase 75 38 - 126 U/L   Total Bilirubin 0.8 0.3 - 1.2 mg/dL   GFR, Estimated >65 >78 mL/min    Comment: (NOTE) Calculated  using the CKD-EPI Creatinine Equation (2021)    Anion gap 11 5 - 15    Comment: Performed at [facility], [address] [place], [place] [address]  Hemoglobin A1c     Status: None   Collection Time: 07/16/22  6:36 PM  Result Value Ref Range   Hgb A1c MFr Bld 5.0 4.8 - 5.6 %    Comment: (NOTE) Pre diabetes:          5.7%-6.4%  Diabetes:              >6.4%  Glycemic control for   <7.0% adults with diabetes    Mean Plasma Glucose 96.8 mg/dL    Comment: Performed at [facility], [address], [place], [place] [address]  Magnesium     Status: None   Collection Time: 07/16/22  6:36 PM  Result Value Ref Range   Magnesium 2.2 1.7 - 2.4 mg/dL    Comment: Performed at [facility], [address],  [place], [place] [address]  Ethanol     Status: None   Collection Time: 07/16/22  6:36 PM  Result Value Ref Range   Alcohol, Ethyl (B) <10 <10 mg/dL    Comment: (NOTE) Lowest detectable limit for serum alcohol is 10 mg/dL.  For medical purposes only. Performed at [facility], [address], [place], [place] [address]   Lipid panel     Status: None   Collection Time: 07/16/22  6:36 PM  Result Value Ref Range   Cholesterol 112 0 - 200 mg/dL   Triglycerides 96 <621 mg/dL   HDL 44 >30 mg/dL   Total CHOL/HDL Ratio 2.5 RATIO   VLDL 19 0 - 40 mg/dL   LDL Cholesterol 49 0 - 99 mg/dL    Comment:        Total Cholesterol/HDL:CHD Risk Coronary Heart Disease Risk Table                     Men   Women  1/2 Average Risk   3.4   3.3  Average Risk       5.0   4.4  2 X Average Risk   9.6   7.1  3 X Average Risk  23.4   11.0        Use the calculated Patient Ratio above and the CHD Risk Table to determine the patient's CHD Risk.        ATP III CLASSIFICATION (LDL):  <100     mg/dL   Optimal  865-784  mg/dL   Near or Above                    Optimal  130-159  mg/dL   Borderline  696-295  mg/dL   High  >284     mg/dL   Very High Performed at [facility], [address] [place], [place] [address]   TSH     Status: None   Collection Time: 07/16/22  6:36 PM  Result Value Ref Range   TSH 1.925 0.350 - 4.500 uIU/mL    Comment: Performed by a 3rd Generation assay with a functional sensitivity of <=0.01 uIU/mL. Performed at [facility], [address] [place], [place] [address]     Blood Alcohol level:  Lab Results  Component Value Date   ETH <10 07/16/2022    Metabolic Disorder Labs:  Lab Results  Component Value Date   HGBA1C 5.0 07/16/2022   MPG 96.8 07/16/2022   No results found for: "PROLACTIN" Lab Results  Component Value Date   CHOL 112 07/16/2022   TRIG 96 07/16/2022   HDL 44 07/16/2022   CHOLHDL 2.5 07/16/2022   VLDL 19 07/16/2022   LDLCALC 49 07/16/2022   LDLCALC 49 03/03/2022    Current Medications: Current Facility-Administered Medications  Medication Dose Route Frequency Provider Last Rate Last Admin   acetaminophen (TYLENOL) tablet 650 mg  650 mg Oral Q6H PRN Sunday Corn, NP       alum & mag hydroxide-simeth (MAALOX/MYLANTA) 200-200-20 MG/5ML suspension 30 mL  30 mL Oral Q4H PRN Sunday Corn, NP       clopidogrel (PLAVIX) tablet 75 mg  75 mg Oral Daily Sunday Corn, NP   75 mg at 07/17/22 2536   ezetimibe (ZETIA) tablet 10 mg  10 mg Oral Daily Sunday Corn, NP   10 mg at 07/17/22 6440   hydrOXYzine (ATARAX) tablet 25 mg  25 mg Oral TID PRN Sunday Corn, NP   25 mg at 07/17/22 0830   LORazepam (ATIVAN) tablet 0.5 mg  0.5 mg Oral TID PRN Sunday Corn, NP       ziprasidone (GEODON) injection 20 mg  20 mg Intramuscular Q12H PRN Sunday Corn, NP       And   LORazepam (ATIVAN) tablet 1 mg  1 mg Oral PRN Sunday Corn, NP       magnesium hydroxide (MILK OF MAGNESIA) suspension 30 mL  30 mL Oral Daily PRN Sunday Corn, NP       rosuvastatin (CRESTOR) tablet 20 mg  20 mg Oral Daily Sunday Corn, NP   20 mg at 07/17/22 1610   traZODone (DESYREL) tablet  50 mg  50 mg Oral QHS PRN Sunday Corn, NP       vortioxetine HBr (TRINTELLIX) tablet 5 mg  5 mg Oral Daily Erskin Zinda, Jackquline Denmark, MD       PTA Medications: Medications Prior to Admission  Medication Sig Dispense Refill Last Dose   clonazePAM (KLONOPIN) 1 MG tablet TAKE ONE TABLET BY MOUTH TWICE DAILY 60 tablet 2    clopidogrel (PLAVIX) 75 MG tablet Take 75 mg by mouth daily.      ergocalciferol (VITAMIN D2) 1.25 MG (50000 UT) capsule Take 50,000 Units by mouth daily.      ezetimibe (ZETIA) 10 MG tablet Take 1 tablet (10 mg total) by mouth daily. 90 tablet 3    LORazepam (ATIVAN) 0.5 MG tablet Take 1 tablet (0.5 mg total) by mouth 3 (three) times daily as needed for anxiety. 90 tablet 0    midodrine (PROAMATINE) 5 MG tablet Take 5 mg  in the morning  and take 2.5 mg ( 1/2 tablet ) in the evening. 45 tablet 3    rosuvastatin (CRESTOR) 20 MG tablet Take 20 mg by mouth daily.  5    Sennosides 15 MG TABS Take 15 mg by mouth daily. Takes 8 tablets a night      valbenazine (INGREZZA) 40 MG capsule Take 1 capsule (40 mg total) by mouth daily. 30 capsule 5     Musculoskeletal: Strength & Muscle Tone: within normal limits Gait & Station: normal Patient leans: N/A            Psychiatric Specialty Exam:  Presentation  General Appearance:  Appropriate for Environment; Casual  Eye Contact: Good  Speech: Clear and Coherent; Normal Rate  Speech Volume: Normal  Handedness: Right   Mood and Affect  Mood: Depressed; Anxious  Affect: Congruent; Flat   Thought Process  Thought Processes: Coherent; Goal Directed  Duration of Psychotic Symptoms:N/A Past Diagnosis of Schizophrenia or Psychoactive disorder: No  Descriptions of Associations:Intact  Orientation:Full (Time, Place and Person)  Thought Content:Logical  Hallucinations:Hallucinations: None  Ideas of Reference:None  Suicidal Thoughts:Suicidal Thoughts: Yes, Passive SI Passive Intent and/or Plan: Without  Intent  Homicidal Thoughts:Homicidal Thoughts: No   Sensorium  Memory: Immediate Good; Recent Good; Remote Good  Judgment: Fair  Insight: Fair   Art therapist  Concentration: Good  Attention Span: Good  Recall: Good  Fund of Knowledge: Good  Language: Good   Psychomotor Activity  Psychomotor Activity: Psychomotor Activity: Restlessness (Constant tapping of right foot)   Assets  Assets: Communication Skills; Desire for Improvement; Financial Resources/Insurance; Housing; Leisure Time; Physical Health; Resilience; Transportation   Sleep  Sleep: Sleep: -- ("Too much") Number of Hours of Sleep: 0 ("Too much")  Physical Exam: Physical Exam Vitals and nursing note reviewed.  Constitutional:      Appearance: Normal appearance.  HENT:     Head: Normocephalic and atraumatic.     Mouth/Throat:     Pharynx: Oropharynx is clear.  Eyes:     Pupils: Pupils are equal, round, and reactive to light.  Cardiovascular:     Rate and Rhythm: Normal rate and regular rhythm.  Pulmonary:     Effort: Pulmonary effort is normal.     Breath sounds: Normal breath sounds.  Abdominal:     General: Abdomen is flat.     Palpations: Abdomen is soft.  Musculoskeletal:        General: Normal range of motion.  Skin:    General: Skin is warm and dry.  Neurological:     General: No focal deficit present.     Mental Status: She is alert. Mental status is at baseline.     Comments: Patient has noticeable involuntary movements especially in the oral area including tongue and lips.  Psychiatric:        Attention and Perception: Attention normal.        Mood and Affect: Mood is depressed. Affect is blunt.        Speech: Speech is delayed.        Behavior: Behavior is slowed.        Thought Content: Thought content includes suicidal ideation. Thought content does not include suicidal plan.        Cognition and Memory: Cognition normal.    Review of Systems   Constitutional: Negative.   HENT: Negative.    Eyes: Negative.   Respiratory: Negative.    Cardiovascular: Negative.   Gastrointestinal: Negative.   Musculoskeletal: Negative.   Skin: Negative.   Neurological:  Positive for tremors.  Psychiatric/Behavioral:  Positive for depression and suicidal ideas. The patient is nervous/anxious.    Blood pressure 116/72, pulse 70, temperature 97.9 F (36.6 C), temperature source Oral, resp. rate 14, height 5\' 1"  (1.549 m), weight 53.1 kg, SpO2 100 %. Body mass index is 22.11 kg/m.  Treatment Plan Summary: Medication management and Plan diagnosis will be continued is major depression recurrent moderate to severe with generalized anxiety.  Patient currently denies acute suicidal ideation.  Probably will not require lengthy hospitalization.  Noted to patient that although she is on clonazepam she is not on any medicine for depression specifically.  Patient reports that she does not recall having had side effects with Trintellix or other antidepressants and that the only reason she stopped taking it supposedly was because of cost.  I suggest restarting Trintellix starting at 5 mg a day.  Continue other medicines although we will not continue the Ingrezza since it is reported that makes anxiety worse.  Outpatient provider had been considering switching to other medicines for tardive dyskinesia.  Include in individual and group therapy.  Ongoing daily assessment of dangerousness.  Also, the patient presents with a very slow speech flat affect at times seems to be cognitive slightly slowed.  She does have a past history of coronary artery disease.  I suggested that given the worsening of her symptoms it would be reasonable to do an MRI especially in a person her age to make sure she does not have strokes or diffuse white matter disease or another neurologic condition that could be accounting for cognitive and mood symptoms.  Also ordering TSH and vitamin B12  level.  Observation Level/Precautions:  15 minute checks  Laboratory:  Chemistry Profile  Psychotherapy:    Medications:    Consultations:    Discharge Concerns:    Estimated LOS:  Other:     Physician Treatment Plan for Primary Diagnosis: MDD (major depressive disorder), recurrent severe, without psychosis (HCC) Long Term Goal(s): Improvement in symptoms so as ready for discharge  Short Term Goals: Ability to verbalize feelings will improve and Ability to disclose and discuss suicidal ideas  Physician Treatment Plan for Secondary Diagnosis: Principal Problem:   MDD (major depressive disorder), recurrent severe, without psychosis (HCC) Active Problems:   Hx of CABG x 4   GAD (generalized anxiety disorder)   Tardive dyskinesia  Long Term Goal(s): Improvement in symptoms so as ready for discharge  Short Term Goals: Ability to maintain clinical measurements within normal limits will improve and Compliance with prescribed medications will improve  I certify that inpatient services furnished can reasonably be expected to improve the patient's condition.    Mordecai Rasmussen, MD 6/6/202410:37 AM

## 2022-07-17 NOTE — Progress Notes (Signed)
Patient denies SI, HI, and AVH. She is calm and cooperative with assessment. Patient is compliant with scheduled medications. She isolates to her room for most of the day, but will come out for meals and medications. Patient also had an MRI done today. She remains safe on the unit at this time.

## 2022-07-17 NOTE — Plan of Care (Signed)
  Problem: Education: Goal: Knowledge of General Education information will improve Description: Including pain rating scale, medication(s)/side effects and non-pharmacologic comfort measures Outcome: Progressing   Problem: Activity: Goal: Risk for activity intolerance will decrease Outcome: Progressing   Problem: Nutrition: Goal: Adequate nutrition will be maintained Outcome: Progressing   Problem: Coping: Goal: Level of anxiety will decrease Outcome: Not Progressing   

## 2022-07-17 NOTE — [facility] Suicide Risk Assessment (Signed)
[facility] Admission Suicide Risk Assessment   Nursing information obtained from:  Patient Demographic factors:  Caucasian Current Mental Status:  Suicidal ideation indicated by patient Loss Factors:  Decline in physical health Historical Factors:  Impulsivity Risk Reduction Factors:  Positive social support, Positive therapeutic relationship  Total Time spent with patient: 45 minutes Principal Problem: MDD (major depressive disorder), recurrent severe, without psychosis (HCC) Diagnosis:  Principal Problem:   MDD (major depressive disorder), recurrent severe, without psychosis (HCC) Active Problems:   Hx of CABG x 4   GAD (generalized anxiety disorder)   Tardive dyskinesia  Subjective Data: Patient seen and chart reviewed.  62 year old woman with a history of depression and anxiety and tardive dyskinesia recommended for hospitalization because of recent reports of suicidal thought.  No intention or plan.  No psychotic symptoms.  Continued Clinical Symptoms:  Alcohol Use Disorder Identification Test Final Score (AUDIT): 0 The "Alcohol Use Disorders Identification Test", Guidelines for Use in Primary Care, Second Edition.  World Science writer [facility]). Score between 0-7:  no or low risk or alcohol related problems. Score between 8-15:  moderate risk of alcohol related problems. Score between 16-19:  high risk of alcohol related problems. Score 20 or above:  warrants further diagnostic evaluation for alcohol dependence and treatment.   CLINICAL FACTORS:   Severe Anxiety and/or Agitation Depression:   Hopelessness   Musculoskeletal: Strength & Muscle Tone: within normal limits Gait & Station: normal Patient leans: N/A  Psychiatric Specialty Exam:  Presentation  General Appearance:  Appropriate for Environment; Casual  Eye Contact: Good  Speech: Clear and Coherent; Normal Rate  Speech Volume: Normal  Handedness: Right   Mood and Affect  Mood: Depressed;  Anxious  Affect: Congruent; Flat   Thought Process  Thought Processes: Coherent; Goal Directed  Descriptions of Associations:Intact  Orientation:Full (Time, Place and Person)  Thought Content:Logical  History of Schizophrenia/Schizoaffective disorder:No  Duration of Psychotic Symptoms:No data recorded Hallucinations:Hallucinations: None  Ideas of Reference:None  Suicidal Thoughts:Suicidal Thoughts: Yes, Passive SI Passive Intent and/or Plan: Without Intent  Homicidal Thoughts:Homicidal Thoughts: No   Sensorium  Memory: Immediate Good; Recent Good; Remote Good  Judgment: Fair  Insight: Fair   Art therapist  Concentration: Good  Attention Span: Good  Recall: Good  Fund of Knowledge: Good  Language: Good   Psychomotor Activity  Psychomotor Activity: Psychomotor Activity: Restlessness (Constant tapping of right foot)   Assets  Assets: Communication Skills; Desire for Improvement; Financial Resources/Insurance; Housing; Leisure Time; Physical Health; Resilience; Transportation   Sleep  Sleep: Sleep: -- ("Too much") Number of Hours of Sleep: 0 ("Too much")    Physical Exam: Physical Exam Vitals and nursing note reviewed.  Constitutional:      Appearance: Normal appearance.  HENT:     Head: Normocephalic and atraumatic.     Mouth/Throat:     Pharynx: Oropharynx is clear.  Eyes:     Pupils: Pupils are equal, round, and reactive to light.  Cardiovascular:     Rate and Rhythm: Normal rate and regular rhythm.  Pulmonary:     Effort: Pulmonary effort is normal.     Breath sounds: Normal breath sounds.  Abdominal:     General: Abdomen is flat.     Palpations: Abdomen is soft.  Musculoskeletal:        General: Normal range of motion.  Skin:    General: Skin is warm and dry.  Neurological:     General: No focal deficit present.     Mental Status: She  is alert. Mental status is at baseline.     Comments: Patient does have  notable involuntary movements especially in the oral area including tongue and cheeks.  Psychiatric:        Attention and Perception: Attention normal.        Mood and Affect: Mood is anxious and depressed. Affect is blunt.        Speech: Speech is delayed.        Behavior: Behavior is slowed.        Thought Content: Thought content includes suicidal ideation. Thought content does not include suicidal plan.        Cognition and Memory: Cognition normal.        Judgment: Judgment normal.    Review of Systems  Constitutional: Negative.   HENT: Negative.    Eyes: Negative.   Respiratory: Negative.    Cardiovascular: Negative.   Gastrointestinal: Negative.   Musculoskeletal: Negative.   Skin: Negative.   Neurological:  Positive for tremors.  Psychiatric/Behavioral:  Positive for depression and suicidal ideas. Negative for hallucinations and substance abuse. The patient is nervous/anxious.    Blood pressure 116/72, pulse 70, temperature 97.9 F (36.6 C), temperature source Oral, resp. rate 14, height 5\' 1"  (1.549 m), weight 53.1 kg, SpO2 100 %. Body mass index is 22.11 kg/m.   COGNITIVE FEATURES THAT CONTRIBUTE TO RISK:  None    SUICIDE RISK:   Mild:  Suicidal ideation of limited frequency, intensity, duration, and specificity.  There are no identifiable plans, no associated intent, mild dysphoria and related symptoms, good self-control (both objective and subjective assessment), few other risk factors, and identifiable protective factors, including available and accessible social support.  PLAN OF CARE: Continue 15-minute checks.  Reviewed with patient appropriate treatments available and recommendations made.  Restart antidepressant medicine.  Engage in individual and group counseling and therapy.  Ongoing assessment of dangerousness prior to discharge  I certify that inpatient services furnished can reasonably be expected to improve the patient's condition.   Mordecai Rasmussen,  MD 07/17/2022, 10:34 AM

## 2022-07-17 NOTE — Plan of Care (Signed)

## 2022-07-17 NOTE — Progress Notes (Signed)
Patient admitted from [facility], report received from [place], Charity fundraiser. Pt calm and pleasant during assessment stating she came into the hospital because she was having SI. Pt stated she is overwhelmed from having TD. Pt denies SI/HI/AVH with this Clinical research associate. Pt skin assessment completed with Marylu Lund, RN, Pt has bruising on left forearm and right deltoid area. Pt oriented to the unit and her room. Pt given education, support, and encouragement to be active in her treatment plan. Pt being monitored Q 15 minutes for safety per unit protocol, remains safe on the unit

## 2022-07-17 NOTE — [facility] Suicide Risk Assessment (Signed)
[facility] INPATIENT:  Family/Significant Other Suicide Prevention Education  Suicide Prevention Education:  Patient Refusal for Family/Significant Other Suicide Prevention Education: The patient Heather Lucas has refused to provide written consent for family/significant other to be provided Family/Significant Other Suicide Prevention Education during admission and/or prior to discharge.  Physician notified.  SPE completed with pt, as pt refused to consent to family contact. SPI pamphlet provided to pt and pt was encouraged to share information with support network, ask questions, and talk about any concerns relating to SPE. Pt denies access to guns/firearms and verbalized understanding of information provided. Mobile Crisis information also provided to pt.   Harden Mo 07/17/2022, 11:17 AM

## 2022-07-18 LAB — PROLACTIN: Prolactin: 13.4 ng/mL (ref 3.6–25.2)

## 2022-07-18 MED ORDER — PROPRANOLOL HCL 10 MG PO TABS
5.0000 mg | ORAL_TABLET | Freq: Two times a day (BID) | ORAL | Status: DC
  Administered 2022-07-19 – 2022-07-21 (×5): 5 mg via ORAL
  Filled 2022-07-18 (×8): qty 0.5

## 2022-07-18 NOTE — Progress Notes (Signed)
[facility] MD Progress Note  07/18/2022 3:38 PM Heather Lucas  MRN:  [id number] Subjective: Patient seen and chart reviewed.  Follow-up 62 year old woman with major depression.  Patient reports her mood is feeling more stable today.  She denies any suicidal thoughts.  Denies homicidal thoughts.  Denies hallucinations.  Tremor is still present.  Tried giving her low-dose propranolol but right now her blood pressure has been too low to tolerate it Principal Problem: MDD (major depressive disorder), recurrent severe, without psychosis (HCC) Diagnosis: Principal Problem:   MDD (major depressive disorder), recurrent severe, without psychosis (HCC) Active Problems:   Hx of CABG x 4   GAD (generalized anxiety disorder)   Tardive dyskinesia  Total Time spent with patient: 30 minutes  Past Psychiatric History: Past history of depression and anxiety  Past Medical History:  Past Medical History:  Diagnosis Date   Borderline hyperlipidemia    as of 10/2016: TC 134, TG 90, HDL 51, LDL 95 (PCP recently increase rosuvastatin dose from 10 to 20 mg)   Borderline hypertension    Coronary artery disease involving native coronary artery of native heart without angina pectoris 03/01/2020   [facility]:; Dr. Adin Hector:  multivessel CAD => 50% pLAD & 60% mLAD, 70% pLCx & 90% mLCx,  70% mid RCA => CABG x 4   Family history of premature coronary artery disease 08/26/2017   Hx of CABG x 4 03/19/2020   [facility], [place], [place] -> CABG x4 (LIMA-LAD, SVG-OM1, SVG-OM2, SVG-dRCA   Hyperlipidemia due to dietary fat intake 08/26/2017    Past Surgical History:  Procedure Laterality Date   CORONARY ARTERY BYPASS GRAFT  03/2020   [facility], [place], [place] -> reportedly CABG x4   GXT/ETT  02/23/2020   From [facility]:: 9:30 min (Stage 3). 10.8 METS 80% MPHR; NO CP but + DOE. -> 1-2 mm Inf  De[pression & AVR STE = c/w Ischemia   LAPAROSCOPIC CHOLECYSTECTOMY  1995   LEFT HEART CATH AND CORONARY ANGIOGRAPHY  03/01/2020   [facility] ([facility]; Dr. Adin Hector:  multivessel CAD => 50% pLAD & 60% mLAD, 70% pLCx & 90% mLCx,  70% mid RCA => CABG   NM [facility]  02/2018   Clearly appears abnormal, referred for [facility], [place]   TRANSTHORACIC ECHOCARDIOGRAM  02/16/2020   [facility] ([facility]:  a) Echo 02/16/2020: EF 60% with mild MR and TR.; b) Post Op Limited Echo: EF 55-60%.  Normal wall motion.  Normal RV.  Mild RA dilation.  No pericardial effusion.  Left pleural effusion noted.   TRANSTHORACIC ECHOCARDIOGRAM  07/2021   ([facility])  EF 65 to 70%.  No RWMA other than possible septal motion.  Mild MAC.  Aortic sclerosis.  Otherwise normal.   Family History:  Family History  Problem Relation Age of Onset   Heart attack Mother 28       Recently died; age 66.   Cancer Father    Heart attack Brother    CAD Brother    Heart attack Maternal Grandmother    Diabetes Maternal Grandmother    Colon cancer Paternal Grandmother    Other Paternal Grandfather        Circulatory problems   Family Psychiatric  History: See previous Social History:  Social History   Substance and Sexual Activity  Alcohol Use Not Currently     Social  History   Substance and Sexual Activity  Drug Use Never    Social History   Socioeconomic History   Marital status: Divorced    Spouse name: Not on file   Number of children: Not on file   Years of education: 12   Highest education level: High school graduate  Occupational History    Employer: [employer]    Comment: Works doing Paramedic  Tobacco Use   Smoking status: Never   Smokeless tobacco: Never  Substance and Sexual Activity   Alcohol use: Not Currently   Drug use: Never   Sexual activity: Not Currently  Other Topics Concern   Not on file   Social History Narrative   She is single.  Lives alone.    Does not routinely exercise      About 3 years ago, she moved down to [place], [place] (in the [place]) delivered with her brother after being born and raised in [place].   Social Determinants of Health   Financial Resource Strain: Not on file  Food Insecurity: No Food Insecurity (07/17/2022)   Hunger Vital Sign    Worried About Running Out of Food in the Last Year: Never true    Ran Out of Food in the Last Year: Never true  Transportation Needs: No Transportation Needs (07/17/2022)   PRAPARE - Administrator, Civil Service (Medical): No    Lack of Transportation (Non-Medical): No  Physical Activity: Not on file  Stress: Not on file  Social Connections: Not on file   Additional Social History:                         Sleep: Fair  Appetite:  Fair  Current Medications: Current Facility-Administered Medications  Medication Dose Route Frequency Provider Last Rate Last Admin   acetaminophen (TYLENOL) tablet 650 mg  650 mg Oral Q6H PRN Sunday Corn, NP       alum & mag hydroxide-simeth (MAALOX/MYLANTA) 200-200-20 MG/5ML suspension 30 mL  30 mL Oral Q4H PRN Sunday Corn, NP       clopidogrel (PLAVIX) tablet 75 mg  75 mg Oral Daily Sunday Corn, NP   75 mg at 07/18/22 4098   ezetimibe (ZETIA) tablet 10 mg  10 mg Oral Daily Sunday Corn, NP   10 mg at 07/18/22 1191   hydrOXYzine (ATARAX) tablet 25 mg  25 mg Oral TID PRN Sunday Corn, NP   25 mg at 07/18/22 4782   LORazepam (ATIVAN) tablet 0.5 mg  0.5 mg Oral TID PRN Sunday Corn, NP   0.5 mg at 07/17/22 2103   ziprasidone (GEODON) injection 20 mg  20 mg Intramuscular Q12H PRN Sunday Corn, NP       And   LORazepam (ATIVAN) tablet 1 mg  1 mg Oral PRN Sunday Corn, NP       magnesium hydroxide (MILK OF MAGNESIA) suspension 30 mL  30 mL Oral Daily PRN Sunday Corn, NP       midodrine (PROAMATINE) tablet 5 mg   5 mg Oral BID WC Gracen Ringwald, Jackquline Denmark, MD   5 mg at 07/18/22 9562   propranolol (INDERAL) tablet 5 mg  5 mg Oral BID Louna Rothgeb T, MD       rosuvastatin (CRESTOR) tablet 20 mg  20 mg Oral Daily Sunday Corn, NP   20 mg at 07/18/22 0828   traZODone (DESYREL) tablet 50 mg  50 mg Oral QHS PRN Sunday Corn, NP   50 mg at 07/17/22 2103   vortioxetine HBr (TRINTELLIX) tablet 5 mg  5 mg Oral Daily Rand Etchison, Jackquline Denmark, MD   5 mg at 07/18/22 0831    Lab Results:  Results for orders placed or performed during the hospital encounter of 07/17/22 (from the past 48 hour(s))  Vitamin B12     Status: None   Collection Time: 07/17/22 10:35 AM  Result Value Ref Range   Vitamin B-12 274 180 - 914 pg/mL    Comment: (NOTE) This assay is not validated for testing neonatal or myeloproliferative syndrome specimens for Vitamin B12 levels. Performed at [facility], [address] [place], [place] [address]   TSH     Status: None   Collection Time: 07/17/22 10:35 AM  Result Value Ref Range   TSH 1.447 0.350 - 4.500 uIU/mL    Comment: Performed by a 3rd Generation assay with a functional sensitivity of <=0.01 uIU/mL. Performed at [facility], [address] [place], [place] [address]     Blood Alcohol level:  Lab Results  Component Value Date   [facility] <10 07/16/2022    Metabolic Disorder Labs: Lab Results  Component Value Date   HGBA1C 5.0 07/16/2022   MPG 96.8 07/16/2022   No results found for: "PROLACTIN" Lab Results  Component Value Date   CHOL 112 07/16/2022   TRIG 96 07/16/2022   HDL 44 07/16/2022   CHOLHDL 2.5 07/16/2022   VLDL 19 07/16/2022   LDLCALC 49 07/16/2022   LDLCALC 49 03/03/2022    Physical Findings: AIMS: Facial and Oral Movements Muscles of Facial Expression: None, normal Lips and Perioral Area: Minimal Jaw: None, normal Tongue: Minimal,Extremity Movements Upper (arms, wrists, hands, fingers): None, normal Lower (legs, knees, ankles, toes): None,  normal, Trunk Movements Neck, shoulders, hips: None, normal, Overall Severity Severity of abnormal movements (highest score from questions above): Minimal Incapacitation due to abnormal movements: None, normal Patient's awareness of abnormal movements (rate only patient's report): Aware, no distress, Dental Status Current problems with teeth and/or dentures?: No Does patient usually wear dentures?: No  CIWA:    COWS:     Musculoskeletal: Strength & Muscle Tone: within normal limits Gait & Station: normal Patient leans: N/A  Psychiatric Specialty Exam:  Presentation  General Appearance:  Appropriate for Environment; Casual  Eye Contact: Good  Speech: Clear and Coherent; Normal Rate  Speech Volume: Normal  Handedness: Right   Mood and Affect  Mood: Depressed; Anxious  Affect: Congruent; Flat   Thought Process  Thought Processes: Coherent; Goal Directed  Descriptions of Associations:Intact  Orientation:Full (Time, Place and Person)  Thought Content:Logical  History of Schizophrenia/Schizoaffective disorder:No  Duration of Psychotic Symptoms:No data recorded Hallucinations:No data recorded Ideas of Reference:None  Suicidal Thoughts:No data recorded Homicidal Thoughts:No data recorded  Sensorium  Memory: Immediate Good; Recent Good; Remote Good  Judgment: Fair  Insight: Fair   Art therapist  Concentration: Good  Attention Span: Good  Recall: Good  Fund of Knowledge: Good  Language: Good   Psychomotor Activity  Psychomotor Activity:No data recorded  Assets  Assets: Communication Skills; Desire for Improvement; Financial Resources/Insurance; Housing; Leisure Time; Physical Health; Resilience; Transportation   Sleep  Sleep:No data recorded   Physical Exam: Physical Exam Vitals and nursing note reviewed.  Constitutional:      Appearance: Normal appearance.  HENT:     Head: Normocephalic and atraumatic.      Mouth/Throat:  Pharynx: Oropharynx is clear.  Eyes:     Pupils: Pupils are equal, round, and reactive to light.  Cardiovascular:     Rate and Rhythm: Normal rate and regular rhythm.  Pulmonary:     Effort: Pulmonary effort is normal.     Breath sounds: Normal breath sounds.  Abdominal:     General: Abdomen is flat.     Palpations: Abdomen is soft.  Musculoskeletal:        General: Normal range of motion.  Skin:    General: Skin is warm and dry.  Neurological:     General: No focal deficit present.     Mental Status: She is alert. Mental status is at baseline.  Psychiatric:        Attention and Perception: Attention normal.        Mood and Affect: Mood normal.        Speech: Speech normal.        Behavior: Behavior normal.        Thought Content: Thought content normal.        Cognition and Memory: Cognition normal.    Review of Systems  Constitutional: Negative.   HENT: Negative.    Eyes: Negative.   Respiratory: Negative.    Cardiovascular: Negative.   Gastrointestinal: Negative.   Musculoskeletal: Negative.   Skin: Negative.   Neurological:  Positive for tremors.  Psychiatric/Behavioral: Negative.     Blood pressure (!) 90/57, pulse 71, temperature 98.4 F (36.9 C), temperature source Oral, resp. rate 17, height 5\' 1"  (1.549 m), weight 53.1 kg, SpO2 97 %. Body mass index is 22.11 kg/m.   Treatment Plan Summary: Plan restarted medicines.  Tolerating medication fine.  Denies suicidal thoughts.  Says that she feels more comfortable making sure that she is stable through the weekend.  Engages appropriately in group activities.  Ongoing assessment of mood over the next couple days.  Labs came back.  MRI scan unremarkable largely normal.  TSH and vitamin B12 also normal.  Mordecai Rasmussen, MD 07/18/2022, 3:38 PM

## 2022-07-18 NOTE — Group Note (Signed)
Date:  07/18/2022 Time:  2:53 AM  Group Topic/Focus:  Wrap-Up Group:   The focus of this group is to help patients review their daily goal of treatment and discuss progress on daily workbooks.    Participation Level:  Active  Participation Quality:  Appropriate and Attentive  Affect:  Appropriate  Cognitive:  Alert and Appropriate  Insight: Good and Improving  Engagement in Group:  Developing/Improving and Engaged  Modes of Intervention:  Support  Additional Comments:     Porter Nakama 07/18/2022, 2:53 AM

## 2022-07-18 NOTE — Plan of Care (Signed)

## 2022-07-18 NOTE — BH IP Treatment Plan (Signed)
Interdisciplinary Treatment and Diagnostic Plan Update  07/18/2022 Time of Session: 8:59AM Heather Lucas MRN: [id number]  Principal Diagnosis: MDD (major depressive disorder), recurrent severe, without psychosis (HCC)  Secondary Diagnoses: Principal Problem:   MDD (major depressive disorder), recurrent severe, without psychosis (HCC) Active Problems:   Hx of CABG x 4   GAD (generalized anxiety disorder)   Tardive dyskinesia   Current Medications:  Current Facility-Administered Medications  Medication Dose Route Frequency Provider Last Rate Last Admin   acetaminophen (TYLENOL) tablet 650 mg  650 mg Oral Q6H PRN Sunday Corn, NP       alum & mag hydroxide-simeth (MAALOX/MYLANTA) 200-200-20 MG/5ML suspension 30 mL  30 mL Oral Q4H PRN Sunday Corn, NP       clopidogrel (PLAVIX) tablet 75 mg  75 mg Oral Daily Sunday Corn, NP   75 mg at 07/18/22 6578   ezetimibe (ZETIA) tablet 10 mg  10 mg Oral Daily Sunday Corn, NP   10 mg at 07/18/22 4696   hydrOXYzine (ATARAX) tablet 25 mg  25 mg Oral TID PRN Sunday Corn, NP   25 mg at 07/18/22 2952   LORazepam (ATIVAN) tablet 0.5 mg  0.5 mg Oral TID PRN Sunday Corn, NP   0.5 mg at 07/17/22 2103   ziprasidone (GEODON) injection 20 mg  20 mg Intramuscular Q12H PRN Sunday Corn, NP       And   LORazepam (ATIVAN) tablet 1 mg  1 mg Oral PRN Sunday Corn, NP       magnesium hydroxide (MILK OF MAGNESIA) suspension 30 mL  30 mL Oral Daily PRN Sunday Corn, NP       midodrine (PROAMATINE) tablet 5 mg  5 mg Oral BID WC Clapacs, Jackquline Denmark, MD   5 mg at 07/18/22 8413   propranolol (INDERAL) tablet 5 mg  5 mg Oral BID Clapacs, John T, MD       rosuvastatin (CRESTOR) tablet 20 mg  20 mg Oral Daily Sunday Corn, NP   20 mg at 07/18/22 2440   traZODone (DESYREL) tablet 50 mg  50 mg Oral QHS PRN Sunday Corn, NP   50 mg at 07/17/22 2103   vortioxetine HBr (TRINTELLIX) tablet 5 mg  5 mg Oral Daily Clapacs, Jackquline Denmark,  MD   5 mg at 07/18/22 0831   PTA Medications: Medications Prior to Admission  Medication Sig Dispense Refill Last Dose   clonazePAM (KLONOPIN) 1 MG tablet TAKE ONE TABLET BY MOUTH TWICE DAILY 60 tablet 2    clopidogrel (PLAVIX) 75 MG tablet Take 75 mg by mouth daily.      ergocalciferol (VITAMIN D2) 1.25 MG (50000 UT) capsule Take 50,000 Units by mouth daily.      ezetimibe (ZETIA) 10 MG tablet Take 1 tablet (10 mg total) by mouth daily. 90 tablet 3    LORazepam (ATIVAN) 0.5 MG tablet Take 1 tablet (0.5 mg total) by mouth 3 (three) times daily as needed for anxiety. 90 tablet 0    midodrine (PROAMATINE) 5 MG tablet Take 5 mg  in the morning  and take 2.5 mg ( 1/2 tablet ) in the evening. 45 tablet 3    rosuvastatin (CRESTOR) 20 MG tablet Take 20 mg by mouth daily.  5    Sennosides 15 MG TABS Take 15 mg by mouth daily. Takes 8 tablets a night      valbenazine (INGREZZA) 40 MG capsule Take 1 capsule (40 mg total)  by mouth daily. 30 capsule 5     Patient Stressors: Health problems    Patient Strengths: Ability for insight  Average or above average intelligence  Motivation for treatment/growth   Treatment Modalities: Medication Management, Group therapy, Case management,  1 to 1 session with clinician, Psychoeducation, Recreational therapy.   Physician Treatment Plan for Primary Diagnosis: MDD (major depressive disorder), recurrent severe, without psychosis (HCC) Long Term Goal(s): Improvement in symptoms so as ready for discharge   Short Term Goals: Ability to maintain clinical measurements within normal limits will improve Compliance with prescribed medications will improve Ability to verbalize feelings will improve Ability to disclose and discuss suicidal ideas  Medication Management: Evaluate patient's response, side effects, and tolerance of medication regimen.  Therapeutic Interventions: 1 to 1 sessions, Unit Group sessions and Medication administration.  Evaluation of  Outcomes: Not Met  Physician Treatment Plan for Secondary Diagnosis: Principal Problem:   MDD (major depressive disorder), recurrent severe, without psychosis (HCC) Active Problems:   Hx of CABG x 4   GAD (generalized anxiety disorder)   Tardive dyskinesia  Long Term Goal(s): Improvement in symptoms so as ready for discharge   Short Term Goals: Ability to maintain clinical measurements within normal limits will improve Compliance with prescribed medications will improve Ability to verbalize feelings will improve Ability to disclose and discuss suicidal ideas     Medication Management: Evaluate patient's response, side effects, and tolerance of medication regimen.  Therapeutic Interventions: 1 to 1 sessions, Unit Group sessions and Medication administration.  Evaluation of Outcomes: Not Met   RN Treatment Plan for Primary Diagnosis: MDD (major depressive disorder), recurrent severe, without psychosis (HCC) Long Term Goal(s): Knowledge of disease and therapeutic regimen to maintain health will improve  Short Term Goals: Ability to demonstrate self-control, Ability to participate in decision making will improve, Ability to verbalize feelings will improve, Ability to disclose and discuss suicidal ideas, Ability to identify and develop effective coping behaviors will improve, and Compliance with prescribed medications will improve  Medication Management: RN will administer medications as ordered by provider, will assess and evaluate patient's response and provide education to patient for prescribed medication. RN will report any adverse and/or side effects to prescribing provider.  Therapeutic Interventions: 1 on 1 counseling sessions, Psychoeducation, Medication administration, Evaluate responses to treatment, Monitor vital signs and CBGs as ordered, Perform/monitor CIWA, COWS, AIMS and Fall Risk screenings as ordered, Perform wound care treatments as ordered.  Evaluation of Outcomes: Not  Met   LCSW Treatment Plan for Primary Diagnosis: MDD (major depressive disorder), recurrent severe, without psychosis (HCC) Long Term Goal(s): Safe transition to appropriate next level of care at discharge, Engage patient in therapeutic group addressing interpersonal concerns.  Short Term Goals: Engage patient in aftercare planning with referrals and resources, Increase social support, Increase ability to appropriately verbalize feelings, Increase emotional regulation, Facilitate acceptance of mental health diagnosis and concerns, and Increase skills for wellness and recovery  Therapeutic Interventions: Assess for all discharge needs, 1 to 1 time with Social worker, Explore available resources and support systems, Assess for adequacy in community support network, Educate family and significant other(s) on suicide prevention, Complete Psychosocial Assessment, Interpersonal group therapy.  Evaluation of Outcomes: Not Met   Progress in Treatment: Attending groups: Yes. Participating in groups: Yes. Taking medication as prescribed: Yes. Toleration medication: Yes. Family/Significant other contact made: No, will contact:  once permission is given. Patient understands diagnosis: Yes. Discussing patient identified problems/goals with staff: Yes. Medical problems stabilized or resolved: Yes.  Denies suicidal/homicidal ideation: Yes. Issues/concerns per patient self-inventory: No. Other: none  New problem(s) identified: No, Describe:  none  New Short Term/Long Term Goal(s):  medication management for mood stabilization; elimination of SI thoughts; development of comprehensive mental wellness plan.   Patient Goals:  "that I don't know, just getting out of here and getting healthy again"  Discharge Plan or Barriers: CSW to assist patient in development of appropriate discharge plans.    Reason for Continuation of Hospitalization: Anxiety Depression Medical Issues Medication  stabilization Suicidal ideation  Estimated Length of Stay:  1-7 days  Last 3 [place] Suicide Severity Risk Score: Flowsheet Row Admission (Current) from 07/17/2022 in [facility] ED from 07/16/2022 in [facility]  C-SSRS RISK CATEGORY Low Risk Moderate Risk       Last [facility] 2/9 Scores:     No data to display          Scribe for Treatment Team: Harden Mo, LCSW 07/18/2022 10:09 AM

## 2022-07-18 NOTE — Progress Notes (Addendum)
This Clinical research associate held scheduled 1700 Propanolol due to low BP. MD notified, in person, of this.   07/18/22 1733  Vital Signs  Pulse Rate 64  Pulse Rate Source Monitor  Resp 16  BP (!) 108/52  BP Location Left Arm  BP Method Automatic  Patient Position (if appropriate) Lying  Oxygen Therapy  SpO2 98 %  O2 Device Room [employer]

## 2022-07-18 NOTE — Progress Notes (Signed)
MD was notified via secure chat about patient's BP and this writer holding scheduled Propanolol.

## 2022-07-18 NOTE — Progress Notes (Signed)
Patient's BP 90/57, so, this Clinical research associate held scheduled Propanolol. Patient given fluids and encouraged to increase fulid intake. Patient also stated that she hasn't been on her Midodrine for a few days, which could be the cause of her BP being low. MD will be notified.   07/18/22 1247  Vital Signs  Pulse Rate 71  BP (!) 90/57  BP Location Left Arm  BP Method Automatic  Patient Position (if appropriate) Standing  Pain Assessment  Pain Scale 0-10  Pain Score 0

## 2022-07-18 NOTE — Progress Notes (Signed)
D- Patient alert and oriented. Patient presented in an anxious, but pleasant mood on assessment reporting that she slept "good" last night and had no issues to voice to this Clinical research associate. Patient endorsed depression and anxiety on her self-inventory, but when this writer questioned her about it, she denied depression, stating "I'm not depressed". Patient stated that she's anxious "because of my tardive dyskinesia". Patient also denied SI, HI, AVH, and pain at this time. When asked in treatment team if she felt ready for discharge, patient stated that she feels as if she needs a few more days. Patient's goal for treatment is "getting out of here and getting healthy again. I don't know what to work on, I just don't want things to be the same".  A- Scheduled medications administered to patient, per MD orders. Support and encouragement provided. Routine safety checks conducted every 15 minutes. Patient informed to notify staff with problems or concerns.  R- No adverse drug reactions noted. Patient contracts for safety at this time. Patient compliant with medications and treatment plan. Patient receptive, calm, and cooperative. Patient interacts well with others on the unit. Patient remains safe at this time.   07/18/22 1259  Psych Admission Type (Psych Patients Only)  Admission Status Voluntary  Psychosocial Assessment  Patient Complaints Anxiety  Eye Contact Fair;Watchful  Facial Expression Trembling lip;Anxious  Affect Anxious  Speech Logical/coherent;Soft  Interaction Assertive  Motor Activity Slow  Appearance/Hygiene In scrubs  Behavior Characteristics Cooperative;Appropriate to situation  Mood Anxious;Pleasant  Aggressive Behavior  Effect No apparent injury  Thought Process  Coherency Circumstantial  Content WDL  Delusions None reported or observed  Perception WDL  Hallucination None reported or observed  Judgment WDL  Confusion None  Danger to Self  Current suicidal ideation? Denies   Self-Injurious Behavior No self-injurious ideation or behavior indicators observed or expressed   Agreement Not to Harm Self Yes  Description of Agreement Verbal  Danger to Others  Danger to Others None reported or observed

## 2022-07-18 NOTE — Group Note (Signed)
Recreation Therapy Group Note   Group Topic:Leisure Education  Group Date: 07/18/2022 Start Time: 1000 End Time: 1100 Facilitators: Rosina Lowenstein, LRT, CTRS Location:  Dayroom  Group Description: Leisure. Patients were given the option to choose from coloring mandalas, singing karaoke, playing cards, or making origami. LRT and pts discussed the meaning of leisure, the importance of participating in leisure during their free time/when they're outside of the hospital, as well as how our leisure interests can also serve as coping skills. Pt identified two leisure interests and shared with the group.   Goal Area(s) Addressed:  Patient will identify a current leisure interest.  Patient will learn the definition of "leisure". Patient will practice making a positive decision. Patient will have the opportunity to try a new leisure activity. Patient will communicate with peers and LRT.   Affect/Mood: Appropriate   Participation Level: Active and Engaged   Participation Quality: Independent   Behavior: Appropriate, Calm, and Cooperative   Speech/Thought Process: Coherent   Insight: Good   Judgement: Good   Modes of Intervention: Activity   Patient Response to Interventions:  Attentive, Engaged, Interested , and Receptive   Education Outcome:  Acknowledges education   Clinical Observations/Individualized Feedback: [name] was active in their participation of session activities and group discussion. Pt identified "I don't really do anything." With prompting from LRT, pt shared that she "sleeps and watches tv" in her free time. Pt chose to color mandalas with oil pastels while in group. Pt interacted well with LRT and peers duration of session.  Plan: Continue to engage patient in RT group sessions 2-3x/week.   Rosina Lowenstein, LRT, CTRS 07/18/2022 11:22 AM

## 2022-07-18 NOTE — Progress Notes (Signed)
Patient was cooperative with treatment on shift, She denies SI, HI  & AVH. She was cooperative with treatment, on shift. No new behavioral issues on shift at this time.

## 2022-07-18 NOTE — Group Note (Signed)
[facility] LCSW Group Therapy Note   Group Date: 07/18/2022 Start Time: 1320 End Time: 1410   Type of Therapy/Topic:  Group Therapy:  Balance in Life  Participation Level:  Did Not Attend   Description of Group:    This group will address the concept of balance and how it feels and looks when one is unbalanced. Patients will be encouraged to process areas in their lives that are out of balance, and identify reasons for remaining unbalanced. Facilitators will guide patients utilizing problem- solving interventions to address and correct the stressor making their life unbalanced. Understanding and applying boundaries will be explored and addressed for obtaining  and maintaining a balanced life. Patients will be encouraged to explore ways to assertively make their unbalanced needs known to significant others in their lives, using other group members and facilitator for support and feedback.  Therapeutic Goals: Patient will identify two or more emotions or situations they have that consume much of in their lives. Patient will identify signs/triggers that life has become out of balance:  Patient will identify two ways to set boundaries in order to achieve balance in their lives:  Patient will demonstrate ability to communicate their needs through discussion and/or role plays  Summary of Patient Progress: X   Therapeutic Modalities:   Cognitive Behavioral Therapy Solution-Focused Therapy Assertiveness Training   Glenis Smoker, LCSW

## 2022-07-19 NOTE — Progress Notes (Signed)
[facility] MD Progress Note  07/19/2022 11:54 AM Heather Lucas  MRN:  [id number] Subjective:  Patient alert and oriented. Denies SI, HI, AVH, and pain.Pt c/o anxiety, and requests prn medication. Pt reported anxiety at 7/10.Her goal for today is to feel less anxious.Pt requested prn hydroxyzine . Patient has been calm, somewhat isolative and anxious on the floor. She is compliant with her medicines and slept better last night. She is displaying some TD and tremors and reports feeling anxious. She is denying current SI/HI or any AVH. She is eating well and is tolerating her meds.  Principal Problem: MDD (major depressive disorder), recurrent severe, without psychosis (HCC) Diagnosis: Principal Problem:   MDD (major depressive disorder), recurrent severe, without psychosis (HCC) Active Problems:   Hx of CABG x 4   GAD (generalized anxiety disorder)   Tardive dyskinesia  Total Time spent with patient: 20 minutes  Past Psychiatric History: See H&P  Past Medical History:  Past Medical History:  Diagnosis Date   Borderline hyperlipidemia    as of 10/2016: TC 134, TG 90, HDL 51, LDL 95 (PCP recently increase rosuvastatin dose from 10 to 20 mg)   Borderline hypertension    Coronary artery disease involving native coronary artery of native heart without angina pectoris 03/01/2020   [facility] ([facility]:; Dr. Adin Hector:  multivessel CAD => 50% pLAD & 60% mLAD, 70% pLCx & 90% mLCx,  70% mid RCA => CABG x 4   Family history of premature coronary artery disease 08/26/2017   Hx of CABG x 4 03/19/2020   [facility], [place], [place] -> CABG x4 (LIMA-LAD, SVG-OM1, SVG-OM2, SVG-dRCA   Hyperlipidemia due to dietary fat intake 08/26/2017    Past Surgical History:  Procedure Laterality Date   CORONARY ARTERY BYPASS GRAFT  03/2020   [facility], [place], [place] -> reportedly CABG x4   GXT/ETT  02/23/2020   From [facility]  ([facility]:: 9:30 min (Stage 3). 10.8 METS 80% MPHR; NO CP but + DOE. -> 1-2 mm Inf De[pression & AVR STE = c/w Ischemia   LAPAROSCOPIC CHOLECYSTECTOMY  1995   LEFT HEART CATH AND CORONARY ANGIOGRAPHY  03/01/2020   [facility] ([facility]; Dr. Adin Hector:  multivessel CAD => 50% pLAD & 60% mLAD, 70% pLCx & 90% mLCx,  70% mid RCA => CABG   NM MYOVIEW LTD  02/2018   Clearly appears abnormal, referred for [facility], [place]   TRANSTHORACIC ECHOCARDIOGRAM  02/16/2020   [facility] ([facility]:  a) Echo 02/16/2020: EF 60% with mild MR and TR.; b) Post Op Limited Echo: EF 55-60%.  Normal wall motion.  Normal RV.  Mild RA dilation.  No pericardial effusion.  Left pleural effusion noted.   TRANSTHORACIC ECHOCARDIOGRAM  07/2021   ([facility])  EF 65 to 70%.  No RWMA other than possible septal motion.  Mild MAC.  Aortic sclerosis.  Otherwise normal.   Family History:  Family History  Problem Relation Age of Onset   Heart attack Mother 55       Recently died; age 41.   Cancer Father    Heart attack Brother    CAD Brother    Heart attack Maternal Grandmother    Diabetes Maternal Grandmother    Colon cancer Paternal Grandmother    Other Paternal Grandfather        Circulatory problems   Family Psychiatric  History: see  H&P Social History:  Social History   Substance and Sexual Activity  Alcohol Use Not Currently     Social History   Substance and Sexual Activity  Drug Use Never    Social History   Socioeconomic History   Marital status: Divorced    Spouse name: Not on file   Number of children: Not on file   Years of education: 12   Highest education level: High school graduate  Occupational History    Employer: [employer]    Comment: Works doing Paramedic  Tobacco Use   Smoking status: Never   Smokeless tobacco: Never  Substance and Sexual Activity   Alcohol use: Not  Currently   Drug use: Never   Sexual activity: Not Currently  Other Topics Concern   Not on file  Social History Narrative   She is single.  Lives alone.    Does not routinely exercise      About 3 years ago, she moved down to [place], [place] (in the [place]) delivered with her brother after being born and raised in [place].   Social Determinants of Health   Financial Resource Strain: Not on file  Food Insecurity: No Food Insecurity (07/17/2022)   Hunger Vital Sign    Worried About Running Out of Food in the Last Year: Never true    Ran Out of Food in the Last Year: Never true  Transportation Needs: No Transportation Needs (07/17/2022)   PRAPARE - Administrator, Civil Service (Medical): No    Lack of Transportation (Non-Medical): No  Physical Activity: Not on file  Stress: Not on file  Social Connections: Not on file   Additional Social History:                         Sleep: Fair  Appetite:  Fair  Current Medications: Current Facility-Administered Medications  Medication Dose Route Frequency Provider Last Rate Last Admin   acetaminophen (TYLENOL) tablet 650 mg  650 mg Oral Q6H PRN Sunday Corn, NP   650 mg at 07/18/22 2033   alum & mag hydroxide-simeth (MAALOX/MYLANTA) 200-200-20 MG/5ML suspension 30 mL  30 mL Oral Q4H PRN Sunday Corn, NP       clopidogrel (PLAVIX) tablet 75 mg  75 mg Oral Daily Sunday Corn, NP   75 mg at 07/19/22 1610   ezetimibe (ZETIA) tablet 10 mg  10 mg Oral Daily Sunday Corn, NP   10 mg at 07/19/22 9604   hydrOXYzine (ATARAX) tablet 25 mg  25 mg Oral TID PRN Sunday Corn, NP   25 mg at 07/19/22 5409   LORazepam (ATIVAN) tablet 0.5 mg  0.5 mg Oral TID PRN Sunday Corn, NP   0.5 mg at 07/18/22 2033   ziprasidone (GEODON) injection 20 mg  20 mg Intramuscular Q12H PRN Sunday Corn, NP       And   LORazepam (ATIVAN) tablet 1 mg  1 mg Oral PRN Sunday Corn, NP       magnesium  hydroxide (MILK OF MAGNESIA) suspension 30 mL  30 mL Oral Daily PRN Sunday Corn, NP       midodrine (PROAMATINE) tablet 5 mg  5 mg Oral BID WC Clapacs, Jackquline Denmark, MD   5 mg at 07/19/22 0826   propranolol (INDERAL) tablet 5 mg  5 mg Oral BID Clapacs, Jackquline Denmark, MD   5 mg at 07/19/22 0824   rosuvastatin (  CRESTOR) tablet 20 mg  20 mg Oral Daily Sunday Corn, NP   20 mg at 07/19/22 0826   traZODone (DESYREL) tablet 50 mg  50 mg Oral QHS PRN Sunday Corn, NP   50 mg at 07/18/22 2033   vortioxetine HBr (TRINTELLIX) tablet 5 mg  5 mg Oral Daily Clapacs, Jackquline Denmark, MD   5 mg at 07/19/22 0830    Lab Results: No results found for this or any previous visit (from the past 48 hour(s)).  Blood Alcohol level:  Lab Results  Component Value Date   ETH <10 07/16/2022    Metabolic Disorder Labs: Lab Results  Component Value Date   HGBA1C 5.0 07/16/2022   MPG 96.8 07/16/2022   Lab Results  Component Value Date   PROLACTIN 13.4 07/16/2022   Lab Results  Component Value Date   CHOL 112 07/16/2022   TRIG 96 07/16/2022   HDL 44 07/16/2022   CHOLHDL 2.5 07/16/2022   VLDL 19 07/16/2022   LDLCALC 49 07/16/2022   LDLCALC 49 03/03/2022    Physical Findings: AIMS: Facial and Oral Movements Muscles of Facial Expression: None, normal Lips and Perioral Area: Minimal Jaw: None, normal Tongue: Minimal,Extremity Movements Upper (arms, wrists, hands, fingers): None, normal Lower (legs, knees, ankles, toes): None, normal, Trunk Movements Neck, shoulders, hips: None, normal, Overall Severity Severity of abnormal movements (highest score from questions above): Minimal Incapacitation due to abnormal movements: None, normal Patient's awareness of abnormal movements (rate only patient's report): Aware, no distress, Dental Status Current problems with teeth and/or dentures?: No Does patient usually wear dentures?: No  CIWA:    COWS:     Musculoskeletal: Strength & Muscle Tone: decreased Gait &  Station: unsteady Patient leans: Front  Psychiatric Specialty Exam:  Presentation  General Appearance:  Appropriate for Environment; Casual  Eye Contact: Good  Speech: Clear and Coherent; Normal Rate  Speech Volume: Normal  Handedness: Right   Mood and Affect  Mood: Depressed; Anxious  Affect: Congruent; Flat   Thought Process  Thought Processes: Coherent; Goal Directed  Descriptions of Associations:Intact  Orientation:Full (Time, Place and Person)  Thought Content:Logical  History of Schizophrenia/Schizoaffective disorder:No  Duration of Psychotic Symptoms:No data recorded Hallucinations:No data recorded Ideas of Reference:None  Suicidal Thoughts:No data recorded Homicidal Thoughts:No data recorded  Sensorium  Memory: Immediate Good; Recent Good; Remote Good  Judgment: Fair  Insight: Fair   Art therapist  Concentration: Good  Attention Span: Good  Recall: Good  Fund of Knowledge: Good  Language: Good   Psychomotor Activity  Psychomotor Activity:No data recorded  Assets  Assets: Communication Skills; Desire for Improvement; Financial Resources/Insurance; Housing; Leisure Time; Physical Health; Resilience; Transportation   Sleep  Sleep:No data recorded   Physical Exam: Physical Exam ROS Blood pressure (!) 82/48, pulse (!) 54, temperature 98.2 F (36.8 C), temperature source Oral, resp. rate 18, height 5\' 1"  (1.549 m), weight 53.1 kg, SpO2 98 %. Body mass index is 22.11 kg/m.   Treatment Plan Summary: Daily contact with patient to assess and evaluate symptoms and progress in treatment, Medication management, and Plan : Continue current medicines and doses.   Leo Rod 07/19/2022, 11:54 AM

## 2022-07-19 NOTE — Group Note (Signed)
LCSW Group Therapy Note   Group Date: 07/19/2022 Start Time: 1305 End Time: 1335   Type of Therapy and Topic:  Group Therapy: Self-Care VS Coping Skills  Participation Level:  Did Not Attend       Summary of Patient Progress:  The patient did not attend group.     Marshell Levan, LCSWA 07/19/2022  3:12 PM

## 2022-07-19 NOTE — Group Note (Signed)
Date:  07/19/2022 Time:  3:51 PM  Group Topic/Focus:  Goals Group:   The focus of this group is to help patients establish daily goals to achieve during treatment and discuss how the patient can incorporate goal setting into their daily lives to aide in recovery.    Participation Level:  Did Not Attend   Lynelle Smoke [facility] 07/19/2022, 3:51 PM

## 2022-07-19 NOTE — Progress Notes (Signed)
Patient was cooperative with treatment on shift, She denies SI, HI  & AVH on shift. No new behavioral issues on shift to report at this time. BP remains low current writer gave patient a Gatorade and encouraged her to  change positions with care.

## 2022-07-19 NOTE — Plan of Care (Signed)
D- Patient alert and oriented. Affect/mood. Denies SI, HI, AVH, and pain.Pt c/o anxiety, and requests prn medication. Pt reported anxiety at 7/10.Her goal for today is to feel less anxious.Pt requested prn hydroxyzine.  A- Scheduled medications administered to patient, per MD orders. Support and encouragement provided.  Routine safety checks conducted every 15 minutes.  Patient informed to notify staff with problems or concerns.  R- No adverse drug reactions noted. Patient contracts for safety at this time. Patient compliant with medications and treatment plan. Patient receptive, calm, and cooperative. Patient interacts well with others on the unit.  Patient remains safe at this time.  Pt reports good result from prn hydroxyzine,anxiety now at 5/10

## 2022-07-19 NOTE — Progress Notes (Signed)
Pt requested prn hydroxyzine for anxiety of 7/10 and prn MOM for constipation.

## 2022-07-19 NOTE — Progress Notes (Signed)
Pt is pleasant and cooperative and has been social with peers and staff. Pt is medication complaint and attended groups today.Pt denies any S/I or H/I and AVH. Her goal today is to focus on getting well.When asked how she will do that she stated "focus" . She reports sleeping well last night and stated sleep medication was effective.

## 2022-07-19 NOTE — Group Note (Signed)
Date:  07/19/2022 Time:  3:49 PM  Group Topic/Focus:  Outdoor Recreation/Activity    Participation Level:  Active  Participation Quality:  Appropriate  Affect:  Appropriate  Cognitive:  Appropriate  Insight: Appropriate  Engagement in Group:  Engaged  Modes of Intervention:  Activity  Additional Comments:    Heather Lucas 07/19/2022, 3:49 PM

## 2022-07-20 NOTE — Progress Notes (Signed)
Pt is calm and cooperative, denies SI HI AVH.  Pt got up around 4:45 AM with c/o anxiety and insomnia. Vital signs were taken.  Pt denies dizziness or lightheadedness despite low SBP.  Pt was given hydroxyzine but declined fluids stating "I take midodrine for my blood pressure."  Pt stated that she had had a BM.  Pt was advised to notify staff of any additional concerns and returned to bed.  Continued monitoring for safety.    07/20/22 0500  Psych Admission Type (Psych Patients Only)  Admission Status Voluntary  Psychosocial Assessment  Patient Complaints Anxiety;Insomnia  Eye Contact Fair  Facial Expression Anxious  Affect Anxious;Preoccupied  Speech Logical/coherent  Interaction Assertive  Motor Activity Slow  Appearance/Hygiene In scrubs  Behavior Characteristics Cooperative;Appropriate to situation  Mood Anxious  Thought Process  Coherency WDL  Content WDL  Delusions None reported or observed  Perception WDL  Hallucination None reported or observed  Judgment WDL  Confusion None  Danger to Self  Current suicidal ideation? Denies  Self-Injurious Behavior No self-injurious ideation or behavior indicators observed or expressed   Agreement Not to Harm Self Yes  Description of Agreement verbal  Danger to Others  Danger to Others None reported or observed

## 2022-07-20 NOTE — Plan of Care (Signed)
Pt denies SI / HI / AVH. Reports increased anxiety. No signs of distress or injury. Pt is adherent with scheduled medications. Staff will continue to monitor Q 15 for safety.     Problem: Education: Goal: Knowledge of General Education information will improve Description: Including pain rating scale, medication(s)/side effects and non-pharmacologic comfort measures Outcome: Progressing   Problem: Clinical Measurements: Goal: Ability to maintain clinical measurements within normal limits will improve Outcome: Progressing Goal: Will remain free from infection Outcome: Progressing

## 2022-07-20 NOTE — Progress Notes (Signed)
[facility] MD Progress Note  07/20/2022 10:21 AM Heather Lucas  MRN:  [id number] Subjective:  Pt is calm and cooperative, denies SI HI AVH. Pt got up around 4:45 AM with c/o anxiety and insomnia. Vital signs were taken. Pt denies dizziness or lightheadedness despite low SBP. Pt was given hydroxyzine but declined fluids stating "I take midodrine for my blood pressure."  Patient has been calm, is compliant with her medicines and had difficulty sleeping last night. She reports improvement in her mood and is denying current SI/HI or any AVH. She is hoping to be discharged soon.  Principal Problem: MDD (major depressive disorder), recurrent severe, without psychosis (HCC) Diagnosis: Principal Problem:   MDD (major depressive disorder), recurrent severe, without psychosis (HCC) Active Problems:   Hx of CABG x 4   GAD (generalized anxiety disorder)   Tardive dyskinesia  Total Time spent with patient: 20 minutes  Past Psychiatric History:   Past Medical History:  Past Medical History:  Diagnosis Date   Borderline hyperlipidemia    as of 10/2016: TC 134, TG 90, HDL 51, LDL 95 (PCP recently increase rosuvastatin dose from 10 to 20 mg)   Borderline hypertension    Coronary artery disease involving native coronary artery of native heart without angina pectoris 03/01/2020   [facility]:; Dr. Adin Hector:  multivessel CAD => 50% pLAD & 60% mLAD, 70% pLCx & 90% mLCx,  70% mid RCA => CABG x 4   Family history of premature coronary artery disease 08/26/2017   Hx of CABG x 4 03/19/2020   [facility], [place], [place] -> CABG x4 (LIMA-LAD, SVG-OM1, SVG-OM2, SVG-dRCA   Hyperlipidemia due to dietary fat intake 08/26/2017    Past Surgical History:  Procedure Laterality Date   CORONARY ARTERY BYPASS GRAFT  03/2020   [facility], [place], [place] -> reportedly CABG x4   GXT/ETT  02/23/2020   From [facility] ([facility]:: 9:30 min (Stage 3). 10.8 METS 80% MPHR; NO CP but + DOE. -> 1-2 mm Inf De[pression & AVR STE = c/w Ischemia   LAPAROSCOPIC CHOLECYSTECTOMY  1995   LEFT HEART CATH AND CORONARY ANGIOGRAPHY  03/01/2020   [facility] ([facility]; Dr. Adin Hector:  multivessel CAD => 50% pLAD & 60% mLAD, 70% pLCx & 90% mLCx,  70% mid RCA => CABG   NM [facility]  02/2018   Clearly appears abnormal, referred for [facility], [place]   TRANSTHORACIC ECHOCARDIOGRAM  02/16/2020   [facility]:  a) Echo 02/16/2020: EF 60% with mild MR and TR.; b) Post Op Limited Echo: EF 55-60%.  Normal wall motion.  Normal RV.  Mild RA dilation.  No pericardial effusion.  Left pleural effusion noted.   TRANSTHORACIC ECHOCARDIOGRAM  07/2021   ([facility])  EF 65 to 70%.  No RWMA other than possible septal motion.  Mild MAC.  Aortic sclerosis.  Otherwise normal.   Family History:  Family History  Problem Relation Age of Onset   Heart attack Mother 40       Recently died; age 46.   Cancer Father    Heart attack Brother    CAD Brother    Heart attack Maternal Grandmother    Diabetes Maternal Grandmother    Colon cancer Paternal Grandmother    Other Paternal Grandfather        Circulatory problems   Family Psychiatric  History:  Social History:  Social History   Substance and Sexual Activity  Alcohol Use Not Currently     Social History   Substance and Sexual Activity  Drug Use Never    Social History   Socioeconomic History   Marital status: Divorced    Spouse name: Not on file   Number of children: Not on file   Years of education: 12   Highest education level: High school graduate  Occupational History    Employer: [employer]    Comment: Works doing Paramedic  Tobacco Use   Smoking status: Never   Smokeless tobacco: Never  Substance and Sexual Activity   Alcohol use: Not Currently   Drug use:  Never   Sexual activity: Not Currently  Other Topics Concern   Not on file  Social History Narrative   She is single.  Lives alone.    Does not routinely exercise      About 3 years ago, she moved down to [place], [place] (in the [place]) delivered with her brother after being born and raised in [place].   Social Determinants of Health   Financial Resource Strain: Not on file  Food Insecurity: No Food Insecurity (07/17/2022)   Hunger Vital Sign    Worried About Running Out of Food in the Last Year: Never true    Ran Out of Food in the Last Year: Never true  Transportation Needs: No Transportation Needs (07/17/2022)   PRAPARE - Administrator, Civil Service (Medical): No    Lack of Transportation (Non-Medical): No  Physical Activity: Not on file  Stress: Not on file  Social Connections: Not on file   Additional Social History:                         Sleep: Poor  Appetite:  Fair  Current Medications: Current Facility-Administered Medications  Medication Dose Route Frequency Provider Last Rate Last Admin   acetaminophen (TYLENOL) tablet 650 mg  650 mg Oral Q6H PRN Sunday Corn, NP   650 mg at 07/18/22 2033   alum & mag hydroxide-simeth (MAALOX/MYLANTA) 200-200-20 MG/5ML suspension 30 mL  30 mL Oral Q4H PRN Sunday Corn, NP       clopidogrel (PLAVIX) tablet 75 mg  75 mg Oral Daily Sunday Corn, NP   75 mg at 07/20/22 0831   ezetimibe (ZETIA) tablet 10 mg  10 mg Oral Daily Sunday Corn, NP   10 mg at 07/20/22 4540   hydrOXYzine (ATARAX) tablet 25 mg  25 mg Oral TID PRN Sunday Corn, NP   25 mg at 07/20/22 0454   LORazepam (ATIVAN) tablet 0.5 mg  0.5 mg Oral TID PRN Sunday Corn, NP   0.5 mg at 07/18/22 2033   ziprasidone (GEODON) injection 20 mg  20 mg Intramuscular Q12H PRN Sunday Corn, NP       And   LORazepam (ATIVAN) tablet 1 mg  1 mg Oral PRN Sunday Corn, NP       magnesium hydroxide (MILK OF MAGNESIA)  suspension 30 mL  30 mL Oral Daily PRN Sunday Corn, NP   30 mL at 07/20/22 0844   midodrine (PROAMATINE) tablet 5 mg  5 mg Oral BID WC Clapacs, Jackquline Denmark, MD   5 mg at 07/20/22 9811   propranolol (INDERAL) tablet 5 mg  5 mg Oral BID Clapacs, Jackquline Denmark, MD   5 mg at 07/20/22 [phone]  rosuvastatin (CRESTOR) tablet 20 mg  20 mg Oral Daily Sunday Corn, NP   20 mg at 07/20/22 1610   traZODone (DESYREL) tablet 50 mg  50 mg Oral QHS PRN Sunday Corn, NP   50 mg at 07/18/22 2033   vortioxetine HBr (TRINTELLIX) tablet 5 mg  5 mg Oral Daily Clapacs, Jackquline Denmark, MD   5 mg at 07/20/22 9604    Lab Results: No results found for this or any previous visit (from the past 48 hour(s)).  Blood Alcohol level:  Lab Results  Component Value Date   ETH <10 07/16/2022    Metabolic Disorder Labs: Lab Results  Component Value Date   HGBA1C 5.0 07/16/2022   MPG 96.8 07/16/2022   Lab Results  Component Value Date   PROLACTIN 13.4 07/16/2022   Lab Results  Component Value Date   CHOL 112 07/16/2022   TRIG 96 07/16/2022   HDL 44 07/16/2022   CHOLHDL 2.5 07/16/2022   VLDL 19 07/16/2022   LDLCALC 49 07/16/2022   LDLCALC 49 03/03/2022    Physical Findings: AIMS: Facial and Oral Movements Muscles of Facial Expression: None, normal Lips and Perioral Area: Minimal Jaw: None, normal Tongue: Minimal,Extremity Movements Upper (arms, wrists, hands, fingers): None, normal Lower (legs, knees, ankles, toes): None, normal, Trunk Movements Neck, shoulders, hips: None, normal, Overall Severity Severity of abnormal movements (highest score from questions above): Minimal Incapacitation due to abnormal movements: None, normal Patient's awareness of abnormal movements (rate only patient's report): Aware, no distress, Dental Status Current problems with teeth and/or dentures?: No Does patient usually wear dentures?: No  CIWA:    COWS:     Musculoskeletal: Strength & Muscle Tone: within normal limits Gait &  Station: normal Patient leans: N/A  Psychiatric Specialty Exam:  Presentation  General Appearance:  Casual; Disheveled  Eye Contact: Fair  Speech: Slow  Speech Volume: Decreased  Handedness: Right   Mood and Affect  Mood: Anxious; Depressed  Affect: Depressed; Restricted   Thought Process  Thought Processes: Coherent  Descriptions of Associations:Circumstantial  Orientation:Full (Time, Place and Person)  Thought Content:Logical  History of Schizophrenia/Schizoaffective disorder:No  Duration of Psychotic Symptoms:No data recorded Hallucinations:Hallucinations: None  Ideas of Reference:None  Suicidal Thoughts:Suicidal Thoughts: No  Homicidal Thoughts:Homicidal Thoughts: No   Sensorium  Memory: Immediate Fair; Remote Fair  Judgment: Fair  Insight: Fair   Art therapist  Concentration: Fair  Attention Span: Fair  Recall: [employer] of Knowledge: Fair  Language: Fair   Psychomotor Activity  Psychomotor Activity: Psychomotor Activity: Psychomotor Retardation   Assets  Assets: Communication Skills; Desire for Improvement   Sleep  Sleep: Sleep: Fair    Physical Exam: Physical Exam Constitutional:      Appearance: Normal appearance. She is normal weight.  HENT:     Head: Normocephalic and atraumatic.     Right Ear: External ear normal.     Left Ear: External ear normal.     Nose: Nose normal.     Mouth/Throat:     Mouth: Mucous membranes are moist.  Eyes:     Conjunctiva/sclera: Conjunctivae normal.     Pupils: Pupils are equal, round, and reactive to light.  Cardiovascular:     Pulses: Normal pulses.     Heart sounds: Normal heart sounds.  Pulmonary:     Effort: Pulmonary effort is normal.     Breath sounds: Normal breath sounds.  Abdominal:     General: Abdomen is flat. Bowel sounds are normal.  Palpations: Abdomen is soft.  Musculoskeletal:     Cervical back: Normal range of motion.  Skin:     General: Skin is warm.  Neurological:     General: No focal deficit present.     Mental Status: She is alert.    Review of Systems  Constitutional: Negative.   HENT: Negative.    Eyes: Negative.   Respiratory: Negative.    Cardiovascular: Negative.   Gastrointestinal: Negative.   Genitourinary: Negative.   Musculoskeletal: Negative.   Skin: Negative.   Neurological: Negative.   Endo/Heme/Allergies: Negative.   Psychiatric/Behavioral:  The patient is nervous/anxious.    Blood pressure (!) 83/57, pulse 66, temperature 98.1 F (36.7 C), temperature source Oral, resp. rate 16, height 5\' 1"  (1.549 m), weight 53.1 kg, SpO2 99 %. Body mass index is 22.11 kg/m.   Treatment Plan Summary: Daily contact with patient to assess and evaluate symptoms and progress in treatment, Medication management, and Plan : continue current meds and doses.   Heather Lucas 07/20/2022, 10:21 AM

## 2022-07-21 ENCOUNTER — Other Ambulatory Visit (HOSPITAL_COMMUNITY): Payer: Self-pay

## 2022-07-21 ENCOUNTER — Other Ambulatory Visit: Payer: Self-pay

## 2022-07-21 MED ORDER — EZETIMIBE 10 MG PO TABS: 10.0000 mg | ORAL_TABLET | Freq: Every day | ORAL | 0 refills | Status: DC

## 2022-07-21 MED ORDER — ROSUVASTATIN CALCIUM 20 MG PO TABS: 20.0000 mg | ORAL_TABLET | Freq: Every day | ORAL | 0 refills | Status: DC

## 2022-07-21 MED ORDER — CLOPIDOGREL BISULFATE 75 MG PO TABS: 75.0000 mg | ORAL_TABLET | Freq: Every day | ORAL | 0 refills | Status: DC

## 2022-07-21 MED ORDER — TRAZODONE HCL 50 MG PO TABS: 50.0000 mg | ORAL_TABLET | Freq: Every evening | ORAL | 0 refills | Status: DC | PRN

## 2022-07-21 MED ORDER — MIDODRINE HCL 5 MG PO TABS: 5.0000 mg | ORAL_TABLET | Freq: Two times a day (BID) | ORAL | 0 refills | Status: DC

## 2022-07-21 MED ORDER — VORTIOXETINE HBR 5 MG PO TABS: 5.0000 mg | ORAL_TABLET | Freq: Every day | ORAL | 0 refills | Status: DC

## 2022-07-21 MED ORDER — CLONAZEPAM 1 MG PO TABS
ORAL_TABLET | ORAL | 0 refills | Status: DC
Start: 2022-07-21 — End: 2022-09-26

## 2022-07-21 NOTE — Telephone Encounter (Signed)
error 

## 2022-07-21 NOTE — [facility] Suicide Risk Assessment (Signed)
[facility] Discharge Suicide Risk Assessment   Principal Problem: MDD (major depressive disorder), recurrent severe, without psychosis (HCC) Discharge Diagnoses: Principal Problem:   MDD (major depressive disorder), recurrent severe, without psychosis (HCC) Active Problems:   Hx of CABG x 4   GAD (generalized anxiety disorder)   Tardive dyskinesia   Total Time spent with patient: 30 minutes  Musculoskeletal: Strength & Muscle Tone: within normal limits Gait & Station: normal Patient leans: N/A  Psychiatric Specialty Exam  Presentation  General Appearance:  Casual; Disheveled  Eye Contact: Fair  Speech: Slow  Speech Volume: Decreased  Handedness: Right   Mood and Affect  Mood: Anxious; Depressed  Duration of Depression Symptoms: Greater than two weeks  Affect: Depressed; Restricted   Thought Process  Thought Processes: Coherent  Descriptions of Associations:Circumstantial  Orientation:Full (Time, Place and Person)  Thought Content:Logical  History of Schizophrenia/Schizoaffective disorder:No  Duration of Psychotic Symptoms:No data recorded Hallucinations:No data recorded Ideas of Reference:None  Suicidal Thoughts:No data recorded Homicidal Thoughts:No data recorded  Sensorium  Memory: Immediate Fair; Remote Fair  Judgment: Fair  Insight: Fair   Art therapist  Concentration: Fair  Attention Span: Fair  Recall: [employer] of Knowledge: Fair  Language: Fair   Psychomotor Activity  Psychomotor Activity:No data recorded  Assets  Assets: Communication Skills; Desire for Improvement   Sleep  Sleep:No data recorded  Physical Exam: Physical Exam Vitals and nursing note reviewed.  Constitutional:      Appearance: Normal appearance.  HENT:     Head: Normocephalic and atraumatic.     Mouth/Throat:     Pharynx: Oropharynx is clear.  Eyes:     Pupils: Pupils are equal, round, and reactive to light.  Cardiovascular:      Rate and Rhythm: Normal rate and regular rhythm.  Pulmonary:     Effort: Pulmonary effort is normal.     Breath sounds: Normal breath sounds.  Abdominal:     General: Abdomen is flat.     Palpations: Abdomen is soft.  Musculoskeletal:        General: Normal range of motion.  Skin:    General: Skin is warm and dry.  Neurological:     General: No focal deficit present.     Mental Status: She is alert. Mental status is at baseline.     Motor: Tremor present.  Psychiatric:        Attention and Perception: Attention normal.        Mood and Affect: Mood is anxious. Affect is blunt.        Speech: Speech normal.        Thought Content: Thought content normal.    Review of Systems  Constitutional: Negative.   HENT: Negative.    Eyes: Negative.   Respiratory: Negative.    Cardiovascular: Negative.   Gastrointestinal: Negative.   Musculoskeletal: Negative.   Skin: Negative.   Neurological:  Positive for tremors.  Psychiatric/Behavioral:  Positive for depression. Negative for suicidal ideas. The patient is nervous/anxious.    Blood pressure (!) 95/57, pulse (!) 59, temperature 98.9 F (37.2 C), resp. rate 18, height 5\' 1"  (1.549 m), weight 53.1 kg, SpO2 97 %. Body mass index is 22.11 kg/m.  Mental Status Per Nursing Assessment::   On Admission:  Suicidal ideation indicated by patient  Demographic Factors:  Caucasian  Loss Factors: Decline in physical health  Historical Factors: NA  Risk Reduction Factors:   Positive therapeutic relationship  Continued Clinical Symptoms:  Depression:   Anhedonia  Cognitive Features That Contribute To Risk:  None    Suicide Risk:  Minimal: No identifiable suicidal ideation.  Patients presenting with no risk factors but with morbid ruminations; may be classified as minimal risk based on the severity of the depressive symptoms   Follow-up Information     [facility] Follow up.   Specialty: [facility] information: [address], [address] [place] [address] [phone]                Plan Of Care/Follow-up recommendations:  Other:  Patient denies suicidal ideation.  Affect slightly blunted and mood mildly dysphoric but no thought of self-harm.  Does not appear psychotic.  Frustrated that her tardive dyskinesia is no better.  We have not been able to try propranolol because of her low blood pressure.  Patient does not appear to be an acute danger to self at time of discharge and will be followed up back in her usual mental health care provider.  Mordecai Rasmussen, MD 07/21/2022, 10:55 AM

## 2022-07-21 NOTE — Discharge Summary (Signed)
Physician Discharge Summary Note  Patient:  Heather Lucas is an 62 y.o., female MRN:  [id number] DOB:  14-Jun-1960 Patient phone:  [phone] (home)  Patient address:   [address] [place] [address],  Total Time spent with patient: 30 minutes  Date of Admission:  07/17/2022 Date of Discharge: 07/21/2022  Reason for Admission: Patient was admitted to the hospital with a history of depression and anxiety and with tardive dyskinesia of relatively recent onset which has been a great frustration to her.  She has not reported active suicidal ideation while in the hospital.  Principal Problem: MDD (major depressive disorder), recurrent severe, without psychosis (HCC) Discharge Diagnoses: Principal Problem:   MDD (major depressive disorder), recurrent severe, without psychosis (HCC) Active Problems:   Hx of CABG x 4   GAD (generalized anxiety disorder)   Tardive dyskinesia   Past Psychiatric History: History of depression previously stable more recently had some trials of treatment with antipsychotics resulting in tardive dyskinesia which has now created its own problem.  Past Medical History:  Past Medical History:  Diagnosis Date   Borderline hyperlipidemia    as of 10/2016: TC 134, TG 90, HDL 51, LDL 95 (PCP recently increase rosuvastatin dose from 10 to 20 mg)   Borderline hypertension    Coronary artery disease involving native coronary artery of native heart without angina pectoris 03/01/2020   [facility]:; Dr. Adin Hector:  multivessel CAD => 50% pLAD & 60% mLAD, 70% pLCx & 90% mLCx,  70% mid RCA => CABG x 4   Family history of premature coronary artery disease 08/26/2017   Hx of CABG x 4 03/19/2020   [facility], [place], [place] -> CABG x4 (LIMA-LAD, SVG-OM1, SVG-OM2, SVG-dRCA   Hyperlipidemia due to dietary fat intake 08/26/2017    Past Surgical History:  Procedure Laterality Date   CORONARY  ARTERY BYPASS GRAFT  03/2020   [facility], [place], [place] -> reportedly CABG x4   GXT/ETT  02/23/2020   From [facility]:: 9:30 min (Stage 3). 10.8 METS 80% MPHR; NO CP but + DOE. -> 1-2 mm Inf De[pression & AVR STE = c/w Ischemia   LAPAROSCOPIC CHOLECYSTECTOMY  1995   LEFT HEART CATH AND CORONARY ANGIOGRAPHY  03/01/2020   [facility] ([facility]; Dr. Adin Hector:  multivessel CAD => 50% pLAD & 60% mLAD, 70% pLCx & 90% mLCx,  70% mid RCA => CABG   NM MYOVIEW LTD  02/2018   Clearly appears abnormal, referred for [facility], [place]   TRANSTHORACIC ECHOCARDIOGRAM  02/16/2020   [facility] ([facility]:  a) Echo 02/16/2020: EF 60% with mild MR and TR.; b) Post Op Limited Echo: EF 55-60%.  Normal wall motion.  Normal RV.  Mild RA dilation.  No pericardial effusion.  Left pleural effusion noted.   TRANSTHORACIC ECHOCARDIOGRAM  07/2021   ([facility])  EF 65 to 70%.  No RWMA other than possible septal motion.  Mild MAC.  Aortic sclerosis.  Otherwise normal.   Family History:  Family History  Problem Relation Age of Onset   Heart attack Mother 49       Recently died; age 67.   Cancer Father    Heart attack Brother    CAD Brother    Heart attack Maternal Grandmother    Diabetes Maternal Grandmother    Colon cancer Paternal Grandmother  Other Paternal Grandfather        Circulatory problems   Family Psychiatric  History: See previous Social History:  Social History   Substance and Sexual Activity  Alcohol Use Not Currently     Social History   Substance and Sexual Activity  Drug Use Never    Social History   Socioeconomic History   Marital status: Divorced    Spouse name: Not on file   Number of children: Not on file   Years of education: 12   Highest education level: High school graduate  Occupational History    Employer: [employer]     Comment: Works doing Paramedic  Tobacco Use   Smoking status: Never   Smokeless tobacco: Never  Substance and Sexual Activity   Alcohol use: Not Currently   Drug use: Never   Sexual activity: Not Currently  Other Topics Concern   Not on file  Social History Narrative   She is single.  Lives alone.    Does not routinely exercise      About 3 years ago, she moved down to [place], [place] (in the [place]) delivered with her brother after being born and raised in [place].   Social Determinants of Health   Financial Resource Strain: Not on file  Food Insecurity: No Food Insecurity (07/17/2022)   Hunger Vital Sign    Worried About Running Out of Food in the Last Year: Never true    Ran Out of Food in the Last Year: Never true  Transportation Needs: No Transportation Needs (07/17/2022)   PRAPARE - Administrator, Civil Service (Medical): No    Lack of Transportation (Non-Medical): No  Physical Activity: Not on file  Stress: Not on file  Social Connections: Not on file    Hospital Course: Patient admitted to psychiatric unit.  15-minute checks continued.  Patient attended groups but otherwise often stayed isolated to herself.  She did not report any suicidal thought or intent in the hospital and did not report any psychotic symptoms.  Reviewing the chart it looks like there had been a consideration by her outpatient provider to discontinue the Ingrezza and try a different medicine for tardive dyskinesia.  We do not carry that medicine here at the hospital and so I considered trying propranolol but the patient has very low blood pressure despite her midodrine and it does not seem to be a safe thing to do.  At the time of discharge patient is still not reporting any suicidal thought.  She appears to be lucid without psychosis.  I have advised her that it would be best to follow-up with her outpatient psychiatric provider who may be able to work on changing her medicine  for tardive dyskinesia.  No further need for inpatient hospitalization.  Physical Findings: AIMS: Facial and Oral Movements Muscles of Facial Expression: None, normal Lips and Perioral Area: Minimal Jaw: None, normal Tongue: Minimal,Extremity Movements Upper (arms, wrists, hands, fingers): None, normal Lower (legs, knees, ankles, toes): None, normal, Trunk Movements Neck, shoulders, hips: None, normal, Overall Severity Severity of abnormal movements (highest score from questions above): Minimal Incapacitation due to abnormal movements: None, normal Patient's awareness of abnormal movements (rate only patient's report): Aware, no distress, Dental Status Current problems with teeth and/or dentures?: No Does patient usually wear dentures?: No  CIWA:    COWS:     Musculoskeletal: Strength & Muscle Tone: within normal limits Gait & Station: normal Patient leans: N/A   Psychiatric Specialty Exam:  Presentation  General Appearance:  Casual; Disheveled  Eye Contact: Fair  Speech: Slow  Speech Volume: Decreased  Handedness: Right   Mood and Affect  Mood: Anxious; Depressed  Affect: Depressed; Restricted   Thought Process  Thought Processes: Coherent  Descriptions of Associations:Circumstantial  Orientation:Full (Time, Place and Person)  Thought Content:Logical  History of Schizophrenia/Schizoaffective disorder:No  Duration of Psychotic Symptoms:No data recorded Hallucinations:No data recorded Ideas of Reference:None  Suicidal Thoughts:No data recorded Homicidal Thoughts:No data recorded  Sensorium  Memory: Immediate Fair; Remote Fair  Judgment: Fair  Insight: Fair   Art therapist  Concentration: Fair  Attention Span: Fair  Recall: [employer] of Knowledge: Fair  Language: Fair   Psychomotor Activity  Psychomotor Activity:No data recorded  Assets  Assets: Communication Skills; Desire for Improvement   Sleep   Sleep:No data recorded   Physical Exam: Physical Exam Constitutional:      Appearance: Normal appearance.  HENT:     Head: Normocephalic and atraumatic.     Mouth/Throat:     Pharynx: Oropharynx is clear.  Eyes:     Pupils: Pupils are equal, round, and reactive to light.  Cardiovascular:     Rate and Rhythm: Normal rate and regular rhythm.  Pulmonary:     Effort: Pulmonary effort is normal.     Breath sounds: Normal breath sounds.  Abdominal:     General: Abdomen is flat.     Palpations: Abdomen is soft.  Musculoskeletal:        General: Normal range of motion.  Skin:    General: Skin is warm and dry.  Neurological:     General: No focal deficit present.     Mental Status: She is alert. Mental status is at baseline.     Motor: Tremor present.  Psychiatric:        Attention and Perception: Attention normal.        Mood and Affect: Mood is anxious. Affect is blunt.        Speech: Speech normal.        Behavior: Behavior is cooperative.        Thought Content: Thought content normal.        Cognition and Memory: Cognition normal.    Review of Systems  Constitutional: Negative.   HENT: Negative.    Eyes: Negative.   Respiratory: Negative.    Cardiovascular: Negative.   Gastrointestinal: Negative.   Musculoskeletal: Negative.   Skin: Negative.   Neurological:  Positive for tremors.  Psychiatric/Behavioral:  Positive for depression. Negative for hallucinations, substance abuse and suicidal ideas. The patient is nervous/anxious.    Blood pressure (!) 95/57, pulse (!) 59, temperature 98.9 F (37.2 C), resp. rate 18, height 5\' 1"  (1.549 m), weight 53.1 kg, SpO2 97 %. Body mass index is 22.11 kg/m.   Social History   Tobacco Use  Smoking Status Never  Smokeless Tobacco Never   Tobacco Cessation:  N/A, patient does not currently use tobacco products   Blood Alcohol level:  Lab Results  Component Value Date   ETH <10 07/16/2022    Metabolic Disorder Labs:   Lab Results  Component Value Date   HGBA1C 5.0 07/16/2022   MPG 96.8 07/16/2022   Lab Results  Component Value Date   PROLACTIN 13.4 07/16/2022   Lab Results  Component Value Date   CHOL 112 07/16/2022   TRIG 96 07/16/2022   HDL 44 07/16/2022   CHOLHDL 2.5 07/16/2022   VLDL 19 07/16/2022   LDLCALC 49  07/16/2022   LDLCALC 49 03/03/2022    See Psychiatric Specialty Exam and Suicide Risk Assessment completed by Attending Physician prior to discharge.  Discharge destination:  Home  Is patient on multiple antipsychotic therapies at discharge:  No   Has Patient had three or more failed trials of antipsychotic monotherapy by history:  No  Recommended Plan for Multiple Antipsychotic Therapies: NA  Discharge Instructions     Diet - low sodium heart healthy   Complete by: As directed    Increase activity slowly   Complete by: As directed       Allergies as of 07/21/2022   No Known Allergies      Medication List     STOP taking these medications    ergocalciferol 1.25 MG (50000 UT) capsule Commonly known as: VITAMIN D2   Ingrezza 40 MG capsule Generic drug: valbenazine   LORazepam 0.5 MG tablet Commonly known as: ATIVAN   Sennosides 15 MG Tabs       TAKE these medications      Indication  clonazePAM 1 MG tablet Commonly known as: KLONOPIN TAKE ONE TABLET BY MOUTH TWICE DAILY  Indication: Feeling Anxious   clopidogrel 75 MG tablet Commonly known as: PLAVIX Take 1 tablet (75 mg total) by mouth daily. Start taking on: July 22, 2022  Indication: Heart Attack   ezetimibe 10 MG tablet Commonly known as: ZETIA Take 1 tablet (10 mg total) by mouth daily.  Indication: High Amount of Fats in the Blood   midodrine 5 MG tablet Commonly known as: PROAMATINE Take 1 tablet (5 mg total) by mouth 2 (two) times daily with a meal. What changed:  how much to take how to take this when to take this additional instructions  Indication: Disorder of Low Blood  Pressure   rosuvastatin 20 MG tablet Commonly known as: CRESTOR Take 1 tablet (20 mg total) by mouth daily.  Indication: High Amount of Fats in the Blood   traZODone 50 MG tablet Commonly known as: DESYREL Take 1 tablet (50 mg total) by mouth at bedtime as needed for sleep.  Indication: Trouble Sleeping   vortioxetine HBr 5 MG Tabs tablet Commonly known as: TRINTELLIX Take 1 tablet (5 mg total) by mouth daily. Start taking on: July 22, 2022  Indication: Major Depressive Disorder        Follow-up Information     [facility] Follow up.   Specialty: [facility] information: [address] [place] [address] [phone]                Follow-up recommendations:  Other:  Continue current medicine including the prescribed Klonopin at home.  Engage with outpatient therapist and prescription provider.  Comments: Prescriptions provided  Signed: Mordecai Rasmussen, MD 07/21/2022, 11:09 AM

## 2022-07-21 NOTE — Progress Notes (Signed)
Patient ID: Heather Lucas, female   DOB: 01-20-1961, 62 y.o.   MRN: [id number] Patient presents with anxious mood, affect blunted. [name] reports '' I feel the same but I was thinking since it's been five days I would be going home soon. ''  Patient reports she continues to struggle with sleep and sleep was '' fair '' last night but has not been sleeping well. She also reports continuing to feel '' restless and anxious like I have to pace ''  Pt denies any AH OR VH and no signs pt is responding to internal stimuli. Pt denies any SI HI . Pt compliant with medications. She did report increased anxiety and requested prn for this and accepted with good results. Above discussed with treatment team. Pt is safe, will con't to monitor.

## 2022-07-21 NOTE — [facility] Group Notes (Signed)
[facility] Group Notes:  (Nursing/MHT/Case Management/Adjunct)  Date:  07/21/2022  Time:  10:14 AM  Type of Therapy:  Psychoeducational Skills  Participation Level:  Active  Participation Quality:  Appropriate  Affect:  Appropriate  Cognitive:  Appropriate  Insight:  Appropriate  Engagement in Group:  Engaged  Modes of Intervention:  Discussion, Education, and Exploration  Summary of Progress/Problems:  The patient was educated on positive reframing, and how this can impact mood and behavioral patterns. The patients were read two poems and asked to identify negative behavioral patterns that impact their mental health they would like to change. Pt shared and was appropriate.  Heather Lucas 07/21/2022, 10:14 AM

## 2022-07-21 NOTE — Progress Notes (Signed)
D - The patient remained in her room for the evening, but approached the nurses' station to make appropriate requests.  Heather Lucas was pleasant upon contact and denied thoughts of harming herself/others.  The patient appropriately answered questions and her thought process appeared linear.  Medications were accepted as ordered.  Heather Lucas denied auditory hallucinations.  The patient had difficulty falling asleep and appeared anxious while interacting with staff.      A - Medications were provided as ordered, including trazodone for sleep.  PRN hydroxyzine was provided at 2344 due to insomnia/anxiety.  Therapeutic communication and emotional support provided.  Safety checks maintained.  The patient was encouraged to join others in the milieu.    R - The patient fell asleep prior to 0030 and slept until morning.  Sleep quality appeared adequate, but the patient continues to have difficulty falling asleep.  No unsafe behaviors noted.

## 2022-07-21 NOTE — Progress Notes (Signed)
  [facility] Adult Case Management Discharge Plan :  Will you be returning to the same living situation after discharge:  Yes,  pt plans to return home upon discharge. At discharge, do you have transportation home?: Yes,  pt will need transportation to [facility] to get her vehicle. Do you have the ability to pay for your medications: Yes,  [employer].  Release of information consent forms completed and in the chart;  Patient's signature needed at discharge.  Patient to Follow up at:  Follow-up Information     [facility] Follow up.   Specialty: Behavioral Health Why: You have an appointment with Elio Forget on tomorrow, 07/22/22 at 3PM. You have an appointment with Frederich Balding on Friday, 07/25/22 at 4PM. Thanks! Contact information: [address], [address] [place] [address] [phone]                Next level of care provider has access to [facility] Link:no  Safety Planning and Suicide Prevention discussed: Yes,  SPE completed with pt.     Has patient been referred to the Quitline?: Patient does not use tobacco/nicotine products  Patient has been referred for addiction treatment: No known substance use disorder.  Glenis Smoker, LCSW 07/21/2022, 2:52 PM

## 2022-07-21 NOTE — Progress Notes (Signed)
Patient ID: Heather Lucas, female   DOB: 1960-04-25, 62 y.o.   MRN: [id number] Order received for patient discharge. Patient denies any SI HI or AV Hallucinations. Reviewed AVS with patient at length including crisis services. Patient verbalized understanding. Opportunity for questions provided. No signs of acute decompensation. All belongings returned and pt escorted to cab.

## 2022-07-21 NOTE — Group Note (Signed)
[facility] LCSW Group Therapy Note    Group Date: 07/21/2022 Start Time: 1300 End Time: 1350  Type of Therapy and Topic:  Group Therapy:  Overcoming Obstacles  Participation Level:  [facility] PARTICIPATION LEVEL: Minimal   Description of Group:   In this group patients will be encouraged to explore what they see as obstacles to their own wellness and recovery. They will be guided to discuss their thoughts, feelings, and behaviors related to these obstacles. The group will process together ways to cope with barriers, with attention given to specific choices patients can make. Each patient will be challenged to identify changes they are motivated to make in order to overcome their obstacles. This group will be process-oriented, with patients participating in exploration of their own experiences as well as giving and receiving support and challenge from other group members.  Therapeutic Goals: 1. Patient will identify personal and current obstacles as they relate to admission. 2. Patient will identify barriers that currently interfere with their wellness or overcoming obstacles.  3. Patient will identify feelings, thought process and behaviors related to these barriers. 4. Patient will identify two changes they are willing to make to overcome these obstacles:    Summary of Patient Progress Patient was present for the entirety of the group process. She identified fear of not having control over her finances as an obstacle that she is facing and needs to overcome. Pt stated that it will take determination in order to help overcome this obstacle. She appeared open and receptive to feedback/comments from both peers and facilitator. Insight into to topic is questionable.    Therapeutic Modalities:   Cognitive Behavioral Therapy Solution Focused Therapy Motivational Interviewing Relapse Prevention Therapy   Glenis Smoker, LCSW

## 2022-07-22 ENCOUNTER — Ambulatory Visit: Payer: BC Managed Care – PPO | Admitting: Mental Health

## 2022-07-22 ENCOUNTER — Other Ambulatory Visit (HOSPITAL_COMMUNITY): Payer: Self-pay

## 2022-07-22 ENCOUNTER — Telehealth: Payer: Self-pay | Admitting: Adult Health

## 2022-07-22 DIAGNOSIS — F331 Major depressive disorder, recurrent, moderate: Secondary | ICD-10-CM

## 2022-07-22 NOTE — Progress Notes (Signed)
[facility]   Name: Heather Lucas Date: 07/22/2022 MRN: [id number] DOB: 04-Jan-1961 PCP: Cleatis Polka., MD   Time spent:  50 minutes   Treatment:  Ind. therapy   Mental Status Exam:         Appearance:    Casual      Behavior:   Appropriate   Motor:   WNL   Speech/Language:    Clear and Coherent   Affect:   Full range    Mood:   anxious   Thought process:   Logical, linear, goal directed   Thought content:     WNL   Sensory/Perceptual disturbances:     none   Orientation:   x4   Attention:   Good   Concentration:   Good   Memory:   Intact   Fund of knowledge:    Consistent with age and development   Insight:     Good   Judgment:    Good   Impulse Control:   Good       Reported Symptoms:  anxiety daily (10/10), depression (3/10)   Risk Assessment: Danger to Self:  No Self-injurious Behavior: No Danger to Others: No Duty to Warn:no Physical Aggression / Violence:No  Access to Firearms a concern: No  Gang Involvement:No  Patient / guardian was educated about steps to take if suicide or homicide risk level increases between visits: yes While future psychiatric events cannot be accurately predicted, the patient does not currently require acute inpatient psychiatric care and does not currently meet [facility] involuntary commitment criteria.   Medical History/Surgical History:     Past Medical History:  Diagnosis Date   Agatston coronary artery calcium score between 100 and 199 08/26/2017   Borderline hyperlipidemia      as of 10/2016: TC 134, TG 90, HDL 51, LDL 95 (PCP recently increase rosuvastatin dose from 10 to 20 mg)   Borderline hypertension     Coronary artery disease involving native coronary artery of native heart without angina pectoris 02/18/2020   Family history of premature coronary artery disease 08/26/2017   Hx of CABG x 4 06/17/2021   Hyperlipidemia due to dietary fat intake 08/26/2017   Ischemic cardiomyopathy 06/17/2021            Past Surgical History:  Procedure Laterality Date   CORONARY ARTERY BYPASS GRAFT   03/2020    [facility], [place], [place] -> reportedly CABG x4   LAPAROSCOPIC CHOLECYSTECTOMY   1995   LEFT HEART CATH AND CORONARY ANGIOGRAPHY   02/2020    [facility] [place], [place]) -> multivessel disease, referred for CABG   NM MYOVIEW LTD   02/2018    Clearly appears abnormal, referred for [facility], [place]   TRANSTHORACIC ECHOCARDIOGRAM   02/2020    Several echoes in January and February 2022-initially noted to be abnormal along with Myoview stress test, immediately post CABG recheck      Medications:       Current Outpatient Medications  Medication Sig Dispense Refill   aspirin 81 MG EC tablet Take 81 mg by mouth daily.       buPROPion (WELLBUTRIN XL) 300 MG 24 hr tablet Take 1 tablet (300 mg total) by mouth daily. 30 tablet 2   clonazePAM (KLONOPIN) 1 MG tablet TAKE ONE TABLET BY MOUTH TWICE DAILY 60 tablet 2   clopidogrel (PLAVIX) 75 MG tablet Take 75 mg by mouth daily.       ergocalciferol (VITAMIN D2) 1.25  MG (50000 UT) capsule Take 50,000 Units by mouth daily.       ezetimibe (ZETIA) 10 MG tablet Take 1 tablet (10 mg total) by mouth daily. 90 tablet 3   midodrine (PROAMATINE) 5 MG tablet Take 5 mg  in the morning  and take 2.5 mg ( 1/2 tablet ) in the evening.       mirtazapine (REMERON) 15 MG tablet Take 1 tablet (15 mg total) by mouth at bedtime. 30 tablet 2   rosuvastatin (CRESTOR) 20 MG tablet Take 20 mg by mouth daily.   5   Sennosides 15 MG TABS Take 15 mg by mouth daily. Takes 8 tablets a night       valbenazine (INGREZZA) 40 MG capsule Take 1 capsule (40 mg total) by mouth daily. 30 capsule 5   valbenazine (INGREZZA) 40 MG capsule Take 1 capsule (40 mg total) by mouth daily. 30 capsule 0    No current facility-administered medications for this visit.        Subjective:  Patient presents on time for today's session.  Assessed progress where patient  shared how she was inpatient psychiatrically, discharged yesterday.  She reported having increased SI due to her tardive dyskinesia symptoms worsening which she feels affected her work functioning.  She continues to have difficulty getting moving in the morning some days and was late today getting into work about an hour and half late.  Explored ways she is wanting to cope and care for herself where she identified the need to start working out.  Explored collaboratively ways to get started, encouraged her to write her schedule down and adhere to her schedule consistently.  Through further discussion she plans to start the workouts this weekend and do them in the morning and during the week prior to her going to work which will help her be more consistent with attendance.  She plans to try to reach out with friends to spend time with them when possible.  She identified the need to further open her living room to allow for more like to come in as she stated it is often dark which she feels impacts her mood.  Interventions: CBT, supportive therapy, motivational interviewing   Diagnoses:      ICD-10-CM    1. Major depressive disorder, recurrent episode, moderate (HCC)  F33.1                 Plan: Patient is to use CBT, mindfulness and coping skills to help manage decrease symptoms associated with their diagnosis.     Long-term goals:   Maintain symptom reduction: The patient will report sustained reduction in symptoms of anxiety using both CBT and mindfulness interventions for 3 consecutive months progressively.  Improve emotional regulation: The patient will learn and apply CBT and mindfulness-based strategies to regulate emotions, such as mindfulness-based stress reduction and cognitive restructuring, and report an improvement in emotional regulation for at least 3 consecutive months progressively.   Short-term goal:  The patient will learn and apply CBT and mindfulness-based coping skills for  managing anxiety and practice using it between sessions.       2.   Increase awareness of automatic thoughts: The patient will learn to identify automatic thoughts and increase awareness of their impact on emotions and behaviors through both CBT and mindfulness-based interventions.       3.    Identify, challenge, and replace fearful/anxious/depressive self talk with positive, realistic, and empowering self talk that does not feed anxiety nor  depression.         Waldron Session, [facility]

## 2022-07-22 NOTE — Telephone Encounter (Signed)
Etherine is in the office today to see Thayer Ohm.  She asked that you send in the new medication for her TD.  She wasn't sure what it is called, Perhaps Austedo.  Has appt 6/14.

## 2022-07-23 ENCOUNTER — Other Ambulatory Visit (HOSPITAL_COMMUNITY): Payer: Self-pay

## 2022-07-23 NOTE — Telephone Encounter (Signed)
She was just released from the hospital and I plan to discuss at her appointment on Friday. She wants to stop the [place] and consider starting Austedo.

## 2022-07-23 NOTE — Telephone Encounter (Signed)
Noted will hold off sending Rx until apt with [facility]

## 2022-07-23 NOTE — Telephone Encounter (Signed)
Is pt still suppose to be on Ingrezza 40 mg?

## 2022-07-25 ENCOUNTER — Ambulatory Visit (INDEPENDENT_AMBULATORY_CARE_PROVIDER_SITE_OTHER): Payer: BC Managed Care – PPO | Admitting: Adult Health

## 2022-07-25 ENCOUNTER — Encounter: Payer: Self-pay | Admitting: Adult Health

## 2022-07-25 DIAGNOSIS — F410 Panic disorder [episodic paroxysmal anxiety] without agoraphobia: Secondary | ICD-10-CM

## 2022-07-25 DIAGNOSIS — F411 Generalized anxiety disorder: Secondary | ICD-10-CM

## 2022-07-25 DIAGNOSIS — G2401 Drug induced subacute dyskinesia: Secondary | ICD-10-CM

## 2022-07-25 DIAGNOSIS — G4700 Insomnia, unspecified: Secondary | ICD-10-CM

## 2022-07-25 DIAGNOSIS — G2571 Drug induced akathisia: Secondary | ICD-10-CM

## 2022-07-25 DIAGNOSIS — F331 Major depressive disorder, recurrent, moderate: Secondary | ICD-10-CM

## 2022-07-25 MED ORDER — MIRTAZAPINE 7.5 MG PO TABS
7.5000 mg | ORAL_TABLET | Freq: Every day | ORAL | 2 refills | Status: DC
Start: 2022-07-25 — End: 2022-09-26

## 2022-07-25 NOTE — Progress Notes (Signed)
Heather Lucas [id number] 07/05/1960 62 y.o.  Subjective:   Patient ID:  Heather Lucas is a 62 y.o. (DOB Jun 28, 1960) female.  Chief Complaint: No chief complaint on file.   HPI [name] presents to the office today for follow-up of MDD, GAD, panic attacks and TD.  Describes mood today as "ok". Pleasant. Denies tearfulness. Mood symptoms - denies depression - "I might be sad because I don't have anyone to spend time with". Reports anxiety - "mostly, it's just at work".  Denies irritability. Reports "some" worry, rumination, and over thinking - worries about losing her job. Mood is consistent. Stating "I'm not doing good, but I don't know why". Has tapered off of Wellbutrin and is eating better. Reports taking Buspar once daily most days, but feels like it makes her sleepy.  Reports taking Clonazepam at bedtime for sleep. Reports stable interest and motivation, but doesn't have a lot to do. Taking medications as prescribed.  Energy levels low. Active, does not have a regular exercise routine. Enjoys some usual interests and activities. Single. Lives alone. Brother in [place]. Friends local. Attends church. Appetite adequate. Weight stable - 114 from 125 pounds. Sleeps well most nights. Averages 10 hours. Taking Melatonin.  Focus and concentration difficulties. Completing tasks. Managing aspects of household. 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  Denies AH or VH. Denies self harm. Denies substance use.  Previous medication trials:  Zoloft, Prozac, Wellbutrin, Lexapro, Trintellix, Hydroxyzine.   AIMS    Flowsheet Row Admission (Discharged) from 07/17/2022 in [facility] INPATIENT BEHAVIORAL MEDICINE Office Visit from 12/13/2021 in [facility]  AIMS Total Score 4 28      AUDIT    Flowsheet Row Admission (Discharged) from 07/17/2022 in [facility] [place] INPATIENT BEHAVIORAL MEDICINE  Alcohol Use Disorder Identification Test Final Score (AUDIT) 0       Flowsheet Row Admission (Discharged) from 07/17/2022 in [facility] INPATIENT BEHAVIORAL MEDICINE ED from 07/16/2022 in [facility]  C-SSRS RISK CATEGORY Low Risk Moderate Risk        Review of Systems:  Review of Systems  Musculoskeletal:  Negative for gait problem.  Neurological:  Negative for tremors.  Psychiatric/Behavioral:         Please refer to HPI    Medications: I have reviewed the patient's current medications.  Current Outpatient Medications  Medication Sig Dispense Refill   clonazePAM (KLONOPIN) 1 MG tablet TAKE ONE TABLET BY MOUTH TWICE DAILY 60 tablet 0   clopidogrel (PLAVIX) 75 MG tablet Take 1 tablet (75 mg total) by mouth daily. 30 tablet 0   ezetimibe (ZETIA) 10 MG tablet Take 1 tablet (10 mg total) by mouth daily. 30 tablet 0   midodrine (PROAMATINE) 5 MG tablet Take 1 tablet (5 mg total) by mouth 2 (two) times daily with a meal. 60 tablet 0   rosuvastatin (CRESTOR) 20 MG tablet Take 1 tablet (20 mg total) by mouth daily. 30 tablet 0   traZODone (DESYREL) 50 MG tablet Take 1 tablet (50 mg total) by mouth at bedtime as needed for sleep. 30 tablet 0   vortioxetine HBr (TRINTELLIX) 5 MG TABS tablet Take 1 tablet (5 mg total) by mouth daily. 30 tablet 0   No current facility-administered medications for this visit.    Medication Side Effects: None  Allergies: No Known Allergies  Past Medical History:  Diagnosis Date   Borderline hyperlipidemia    as of 10/2016: TC 134, TG 90, HDL 51, LDL 95 (PCP recently  increase rosuvastatin dose from 10 to 20 mg)   Borderline hypertension    Coronary artery disease involving native coronary artery of native heart without angina pectoris 03/01/2020   [facility]:; Dr. Adin Hector:  multivessel CAD => 50% pLAD & 60% mLAD, 70% pLCx & 90% mLCx,  70% mid RCA => CABG x 4   Family history of premature coronary artery disease 08/26/2017   Hx of CABG x 4 03/19/2020    [facility], [place], [place] -> CABG x4 (LIMA-LAD, SVG-OM1, SVG-OM2, SVG-dRCA   Hyperlipidemia due to dietary fat intake 08/26/2017    Past Medical History, Surgical history, Social history, and Family history were reviewed and updated as appropriate.   Please see review of systems for further details on the patient's review from today.   Objective:   Physical Exam:  There were no vitals taken for this visit.  Physical Exam Constitutional:      General: She is not in acute distress. Musculoskeletal:        General: No deformity.  Neurological:     Mental Status: She is alert and oriented to person, place, and time.     Coordination: Coordination normal.  Psychiatric:        Attention and Perception: Attention and perception normal. She does not perceive auditory or visual hallucinations.        Mood and Affect: Mood normal. Mood is not anxious or depressed. Affect is not labile, blunt, angry or inappropriate.        Speech: Speech normal.        Behavior: Behavior normal.        Thought Content: Thought content normal. Thought content is not paranoid or delusional. Thought content does not include homicidal or suicidal ideation. Thought content does not include homicidal or suicidal plan.        Cognition and Memory: Cognition and memory normal.        Judgment: Judgment normal.     Comments: Insight intact     Lab Review:     Component Value Date/Time   NA 141 07/16/2022 1836   NA 141 10/16/2017 1412   K 4.0 07/16/2022 1836   CL 105 07/16/2022 1836   CO2 25 07/16/2022 1836   GLUCOSE 167 (H) 07/16/2022 1836   BUN 10 07/16/2022 1836   BUN 14 10/16/2017 1412   CREATININE 1.05 (H) 07/16/2022 1836   CALCIUM 9.1 07/16/2022 1836   PROT 5.8 (L) 07/16/2022 1836   PROT 6.0 03/03/2022 0833   ALBUMIN 3.9 07/16/2022 1836   ALBUMIN 4.1 03/03/2022 0833   AST 24 07/16/2022 1836   ALT 20 07/16/2022 1836   ALKPHOS 75 07/16/2022 1836   BILITOT 0.8 07/16/2022 1836    BILITOT 0.5 03/03/2022 0833   GFRNONAA >60 07/16/2022 1836   GFRAA 95 10/16/2017 1412       Component Value Date/Time   WBC 5.9 07/16/2022 1836   RBC 4.40 07/16/2022 1836   HGB 13.5 07/16/2022 1836   HCT 40.6 07/16/2022 1836   PLT 209 07/16/2022 1836   MCV 92.3 07/16/2022 1836   MCH 30.7 07/16/2022 1836   MCHC 33.3 07/16/2022 1836   RDW 11.9 07/16/2022 1836   LYMPHSABS 1.8 07/16/2022 1836   MONOABS 0.4 07/16/2022 1836   EOSABS 0.0 07/16/2022 1836   BASOSABS 0.0 07/16/2022 1836    No results found for: "POCLITH", "LITHIUM"   No results found for: "PHENYTOIN", "PHENOBARB", "VALPROATE", "CBMZ"   .res Assessment: Plan:  Plan:  PDMP reviewed  Clonazepam 1mg  twice daily Trintellix 5mg  daily Ingrezza 40mg  daily at bedtime.  D/C Trazadone 50mg  at hs   Add Remeron 7.5mg  at hs  Time spent with patient was 25 minutes. Greater than 50% of face to face time with patient was spent on counseling and coordination of care.    RTC 2 weeks.  Patient advised to contact office with any questions, adverse effects, or acute worsening in signs and symptoms.   Discussed potential benefits, risk, and side effects of benzodiazepines to include potential risk of tolerance and dependence, as well as possible drowsiness.  Advised patient not to drive if experiencing drowsiness and to take lowest possible effective dose to minimize risk of dependence and tolerance.   Discussed potential metabolic side effects associated with atypical antipsychotics, as well as potential risk for movement side effects. Advised pt to contact office if movement side effects occur.  There are no diagnoses linked to this encounter.   Please see After Visit Summary for patient specific instructions.  Future Appointments  Date Time Provider Department Center  07/25/2022  4:00 PM Kallan Merrick, Thereasa Solo, NP CP-CP None  09/03/2022  4:00 PM Waldron Session, [facility] [place] [facility] CP[facility]CP None    No orders of the defined types  were placed in this encounter.   -------------------------------

## 2022-07-30 ENCOUNTER — Telehealth: Payer: Self-pay | Admitting: Adult Health

## 2022-07-30 ENCOUNTER — Encounter: Payer: Self-pay | Admitting: Adult Health

## 2022-07-30 ENCOUNTER — Ambulatory Visit: Payer: BC Managed Care – PPO | Admitting: Adult Health

## 2022-07-30 DIAGNOSIS — F411 Generalized anxiety disorder: Secondary | ICD-10-CM | POA: Diagnosis not present

## 2022-07-30 DIAGNOSIS — F331 Major depressive disorder, recurrent, moderate: Secondary | ICD-10-CM | POA: Diagnosis not present

## 2022-07-30 DIAGNOSIS — G2401 Drug induced subacute dyskinesia: Secondary | ICD-10-CM

## 2022-07-30 DIAGNOSIS — G2571 Drug induced akathisia: Secondary | ICD-10-CM

## 2022-07-30 DIAGNOSIS — F410 Panic disorder [episodic paroxysmal anxiety] without agoraphobia: Secondary | ICD-10-CM

## 2022-07-30 DIAGNOSIS — G4700 Insomnia, unspecified: Secondary | ICD-10-CM

## 2022-07-30 NOTE — Progress Notes (Addendum)
Heather Lucas [id number] 22-Nov-1960 62 y.o.  Subjective:   Patient ID:  Heather Lucas is a 62 y.o. (DOB [birth year]) female.  Chief Complaint: No chief complaint on file.   HPI Heather Lucas presents to the office today for follow-up of MDD, GAD, panic attacks and TD.  08/02/2021 - initial evaluation. Reports she moved back to [place] from [place] where she lived for 2 and 1/2 years. Now living alone in [place]. Has friends in the area and has adjusted to moving back to the area. While living in [place] was seen by a telemedicine provider and was started on Abilify 20mg  and Olanzapine 7.5mg . She was also taking Trintellix when she initially moved to [place] and had Xanax as needed. Reports feeling more anxious and a little depressed after the move. Mostly staying home - works from home and does not get out much. Felt like she was having some adjustment issues and reached out to a tele health company for assistance. Her medications at initial visit were: Lexapro 10mg  daily, Abilify 10mg  daily,Zyprexa 7.5mg  at hs and Xanax 0.5mg  daily. Reports TD and akathisia with current medication regimen and feel uncomfortable.   Reports mouth movements, legs shaking, hands shaking, left hand shaking started all which started after initiating antipsychotic medications.  Describes mood today as "not too good". Pleasant. Denies tearfulness. Mood symptoms - reports some depression and anxiety. Denies irritability. Reports worry, rumination, and over thinking. Denies recent panic attacks. Mood is lower with situational stressors. Stating "I'm not doing too good". Had to take a leave from work because she is unable to do her job. She was given 30 days to improve or she will lose her job. Has restarted the Remeron - 4 days ago and is tolerating. Reports decreased interest and motivation, but doesn't have a lot to do. Taking medications as prescribed.  Energy levels low. Active, trying to exercise - 3 days a week  - 30 minutes of walking.  Enjoys some usual interests and activities. Single. Lives alone. Brother in [place]. Friends local. Attends church. Appetite adequate. Weight stable - 112 from 125 pounds - 61". Sleeps well most nights. Averages 10 hours with starting Remeron. Focus and concentration difficulties - unable to work - was asked to take time off until she is able to "do my job". Reports difficulties thinking clearly. Reports she feels like her mind is slow and she can't push through it. Completing minimal tasks. Managing some aspects of household. 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  Denies AH or VH. Denies self harm. Denies substance use.  Previous medication trials:  Zoloft, Prozac, Wellbutrin, Lexapro, Trintellix, Hydroxyzine.    AIMS    Flowsheet Row Office Visit from 12/13/2021 in [facility]  AIMS Total Score 28      Mini-Mental    Flowsheet Row Office Visit from 07/30/2022 in [facility]  Total Score (max 30 points ) 30        Review of Systems:  Review of Systems  Constitutional:  Positive for appetite change and fatigue.  Musculoskeletal:  Negative for gait problem.  Neurological:  Positive for tremors, speech difficulty and weakness.  Psychiatric/Behavioral:         Please refer to HPI    Medications: I have reviewed the patient's current medications.  Current Outpatient Medications  Medication Sig Dispense Refill   clonazePAM (KLONOPIN) 1 MG tablet TAKE ONE TABLET BY MOUTH TWICE DAILY 60 tablet 0   clopidogrel (PLAVIX)  75 MG tablet Take 1 tablet (75 mg total) by mouth daily. 30 tablet 0   ezetimibe (ZETIA) 10 MG tablet Take 1 tablet (10 mg total) by mouth daily. 30 tablet 0   midodrine (PROAMATINE) 5 MG tablet Take 1 tablet (5 mg total) by mouth 2 (two) times daily with a meal. 60 tablet 0   mirtazapine (REMERON) 7.5 MG tablet Take 1 tablet (7.5 mg total) by mouth at bedtime. 30 tablet  2   rosuvastatin (CRESTOR) 20 MG tablet Take 1 tablet (20 mg total) by mouth daily. 30 tablet 0   traZODone (DESYREL) 50 MG tablet Take 1 tablet (50 mg total) by mouth at bedtime as needed for sleep. 30 tablet 0   vortioxetine HBr (TRINTELLIX) 5 MG TABS tablet Take 1 tablet (5 mg total) by mouth daily. 30 tablet 0   No current facility-administered medications for this visit.    Medication Side Effects: None  Allergies: No Known Allergies  Past Medical History:  Diagnosis Date   Borderline hyperlipidemia    as of 10/2016: TC 134, TG 90, HDL 51, LDL 95 (PCP recently increase rosuvastatin dose from 10 to 20 mg)   Borderline hypertension    Coronary artery disease involving native coronary artery of native heart without angina pectoris 03/01/2020   [facility]:; Dr. Adin Hector:  multivessel CAD => 50% pLAD & 60% mLAD, 70% pLCx & 90% mLCx,  70% mid RCA => CABG x 4   Family history of premature coronary artery disease 08/26/2017   Hx of CABG x 4 03/19/2020   [facility], [place], [place] -> CABG x4 (LIMA-LAD, SVG-OM1, SVG-OM2, SVG-dRCA   Hyperlipidemia due to dietary fat intake 08/26/2017    Past Medical History, Surgical history, Social history, and Family history were reviewed and updated as appropriate.   Please see review of systems for further details on the patient's review from today.   Objective:   Physical Exam:  Physical Exam Constitutional:      General: She is not in acute distress. Musculoskeletal:        General: No deformity.  Neurological:     Mental Status: She is alert and oriented to person, place, and time.     Coordination: Coordination normal.  Psychiatric:        Attention and Perception: Attention and perception difficulties. She does not perceive auditory or visual hallucinations.        Mood and Affect: Mood low. Mood is anxious or depressed. Affect is labile, blunt, angry or inappropriate.         Speech: Speech impaired.        Behavior: Behavior normal.        Thought Content: Thought content abnormal. Thought content is not paranoid or delusional. Thought content does not include homicidal or suicidal ideation. Thought content does not include homicidal or suicidal plan.        Lab Review:     Component Value Date/Time   NA 141 07/16/2022 1836   NA 141 10/16/2017 1412   K 4.0 07/16/2022 1836   CL 105 07/16/2022 1836   CO2 25 07/16/2022 1836   GLUCOSE 167 (H) 07/16/2022 1836   BUN 10 07/16/2022 1836   BUN 14 10/16/2017 1412   CREATININE 1.05 (H) 07/16/2022 1836   CALCIUM 9.1 07/16/2022 1836   PROT 5.8 (L) 07/16/2022 1836   PROT 6.0 03/03/2022 0833   ALBUMIN 3.9 07/16/2022 1836   ALBUMIN 4.1 03/03/2022 8119  AST 24 07/16/2022 1836   ALT 20 07/16/2022 1836   ALKPHOS 75 07/16/2022 1836   BILITOT 0.8 07/16/2022 1836   BILITOT 0.5 03/03/2022 0833   GFRNONAA >60 07/16/2022 1836   GFRAA 95 10/16/2017 1412       Component Value Date/Time   WBC 5.9 07/16/2022 1836   RBC 4.40 07/16/2022 1836   HGB 13.5 07/16/2022 1836   HCT 40.6 07/16/2022 1836   PLT 209 07/16/2022 1836   MCV 92.3 07/16/2022 1836   MCH 30.7 07/16/2022 1836   MCHC 33.3 07/16/2022 1836   RDW 11.9 07/16/2022 1836   LYMPHSABS 1.8 07/16/2022 1836   MONOABS 0.4 07/16/2022 1836   EOSABS 0.0 07/16/2022 1836   BASOSABS 0.0 07/16/2022 1836    No results found for: "POCLITH", "LITHIUM"   No results found for: "PHENYTOIN", "PHENOBARB", "VALPROATE", "CBMZ"   .res Assessment: Plan:    Plan:  PDMP reviewed  Decrease Clonazepam 1mg  in the morning and 0.5mg  to 0.5mg  BID Increase Trintellix 5mg  to 10mg  daily  Ingrezza 40mg  daily at bedtime. Remeron 7.5mg  at hs  Time spent with patient was 25 minutes. Greater than 50% of face to face time with patient was spent on counseling and coordination of care.    Patient totally disabled and unable to work 07/28/2022 through 08/27/2022. Will reassess a  return to work date in 4 weeks.  RTC 2 weeks.  Patient advised to contact office with any questions, adverse effects, or acute worsening in signs and symptoms.   Discussed potential benefits, risk, and side effects of benzodiazepines to include potential risk of tolerance and dependence, as well as possible drowsiness.  Advised patient not to drive if experiencing drowsiness and to take lowest possible effective dose to minimize risk of dependence and tolerance.   Discussed potential metabolic side effects associated with atypical antipsychotics, as well as potential risk for movement side effects. Advised pt to contact office if movement side effects occur.  Diagnoses and all orders for this visit:  Major depressive disorder, recurrent episode, moderate (HCC)  Generalized anxiety disorder  Insomnia, unspecified type  Tardive dyskinesia  Akathisia  Panic attacks     Please see After Visit Summary for patient specific instructions.  Future Appointments  Date Time Provider Department Center  08/13/2022  2:40 PM Zsazsa Bahena, Thereasa Solo, NP CP-CP None  09/03/2022  4:00 PM Waldron Session, [facility] CP-CP None    No orders of the defined types were placed in this encounter.   -------------------------------

## 2022-07-30 NOTE — Telephone Encounter (Signed)
Received Disability Claim Form. Given to [facility] 6/19

## 2022-07-31 ENCOUNTER — Other Ambulatory Visit (HOSPITAL_COMMUNITY): Payer: Self-pay

## 2022-08-04 DIAGNOSIS — Z01419 Encounter for gynecological examination (general) (routine) without abnormal findings: Secondary | ICD-10-CM | POA: Diagnosis not present

## 2022-08-04 DIAGNOSIS — Z1231 Encounter for screening mammogram for malignant neoplasm of breast: Secondary | ICD-10-CM | POA: Diagnosis not present

## 2022-08-04 DIAGNOSIS — Z6821 Body mass index (BMI) 21.0-21.9, adult: Secondary | ICD-10-CM | POA: Diagnosis not present

## 2022-08-04 DIAGNOSIS — Z0289 Encounter for other administrative examinations: Secondary | ICD-10-CM

## 2022-08-04 NOTE — Telephone Encounter (Signed)
Form completed and signed. Pt aware to come pick up.

## 2022-08-08 ENCOUNTER — Ambulatory Visit: Payer: BC Managed Care – PPO | Admitting: Adult Health

## 2022-08-08 DIAGNOSIS — Z0389 Encounter for observation for other suspected diseases and conditions ruled out: Secondary | ICD-10-CM

## 2022-08-08 NOTE — Progress Notes (Signed)
No charge. 

## 2022-08-11 ENCOUNTER — Telehealth: Payer: Self-pay | Admitting: Adult Health

## 2022-08-11 NOTE — Telephone Encounter (Signed)
Referral sent to [facility] for cognitive issues.

## 2022-08-11 NOTE — Telephone Encounter (Signed)
See message.

## 2022-08-13 ENCOUNTER — Encounter: Payer: Self-pay | Admitting: Adult Health

## 2022-08-13 ENCOUNTER — Ambulatory Visit: Payer: BC Managed Care – PPO | Admitting: Adult Health

## 2022-08-13 DIAGNOSIS — F331 Major depressive disorder, recurrent, moderate: Secondary | ICD-10-CM | POA: Diagnosis not present

## 2022-08-13 DIAGNOSIS — G2571 Drug induced akathisia: Secondary | ICD-10-CM

## 2022-08-13 DIAGNOSIS — F411 Generalized anxiety disorder: Secondary | ICD-10-CM | POA: Diagnosis not present

## 2022-08-13 DIAGNOSIS — G4700 Insomnia, unspecified: Secondary | ICD-10-CM | POA: Diagnosis not present

## 2022-08-13 DIAGNOSIS — G2401 Drug induced subacute dyskinesia: Secondary | ICD-10-CM | POA: Diagnosis not present

## 2022-08-13 DIAGNOSIS — F410 Panic disorder [episodic paroxysmal anxiety] without agoraphobia: Secondary | ICD-10-CM

## 2022-08-13 NOTE — Progress Notes (Signed)
[name] [id number] 04-Mar-1960 62 y.o.  Subjective:   Patient ID:  Heather Lucas is a 62 y.o. (DOB 11-01-1960) female.  Chief Complaint: No chief complaint on file.   HPI Ricci Shong presents to the office today for follow-up of MDD, GAD, panic attacks and TD.  08/02/2021 - initial evaluation. Reports she moved back to [place] from [place] where she lived for 2 and 1/2 years. Now living alone in [place]. Has friends in the area and has adjusted to moving back to the area. While living in [place] was seen by a telemedicine provider and was started on Abilify 20mg  and Olanzapine 7.5mg . She was also taking Trintellix when she initially moved to [place] and had Xanax as needed. Reports feeling more anxious and a little depressed after the move. Mostly staying home - works from home and does not get out much. Felt like she was having some adjustment issues and reached out to a tele health company for assistance. Her medications at initial visit were: Lexapro 10mg  daily, Abilify 10mg  daily,Zyprexa 7.5mg  at hs and Xanax 0.5mg  daily. Reports TD and akathisia with that medication regimen and felt very uncomfortable. Reports peri oral mouth movements, legs shaking, hands shaking, left hand shaking started all which started after initiating antipsychotic medications.  Describes mood today as ok". Pleasant. Denies tearfulness. Mood symptoms - reports some depression - "a little more lately". Reports increased anxiety with movement disorder. Denies irritability. Reports increased worry, rumination, and over thinking. Stating "my job is on the line and I don't know what will happen". Reports she is likely going to lose her job because she cannot perform the required functions required. Denies recent panic attacks. Mood is mostly stable. Stating "I feel fairly consistent". Feels like medications are helpful. Reports she continues to struggle with symptoms associated with Tardive Dyskinesia and  akathisia. Has been unable to work with the involuntary movements. Stating "I'm always uncomfortable". Has a hard sitting still and has to more or walk frequently. Reports decreased interest and motivation. Taking medications as prescribed.  Energy levels low. Active, trying to exercise - 3 days a week - 30 minutes of walking.  Enjoys some usual interests and activities. Single. Lives alone. Brother in [place]. Friends local. Attends church. Appetite adequate. Weight stable - 112 from 125 pounds - 61". Sleeps well most nights. Averages 10 hours with starting Remeron. Focus and concentration difficulties - unable to work with TD and akthesia symptoms. Reports difficulties thinking clearly. Reports she feels like her mind is slow. Completing minimal tasks. Managing some aspects of household. 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  Denies AH or VH. Denies self harm. Denies substance use.  Previous medication trials:  Zoloft, Prozac, Wellbutrin, Lexapro, Trintellix, Hydroxyzine.   AIMS    Flowsheet Row Office Visit from 12/13/2021 in [facility]  AIMS Total Score 28      Mini-Mental    Flowsheet Row Office Visit from 07/30/2022 in [facility]  Total Score (max 30 points ) 30        Review of Systems:  Review of Systems  Musculoskeletal:  Negative for gait problem.  Neurological:  Negative for tremors.  Psychiatric/Behavioral:         Please refer to HPI    Medications: I have reviewed the patient's current medications.  Current Outpatient Medications  Medication Sig Dispense Refill   clonazePAM (KLONOPIN) 1 MG tablet TAKE ONE TABLET BY MOUTH TWICE DAILY 60 tablet 0  clopidogrel (PLAVIX) 75 MG tablet Take 1 tablet (75 mg total) by mouth daily. 30 tablet 0   ezetimibe (ZETIA) 10 MG tablet Take 1 tablet (10 mg total) by mouth daily. 30 tablet 0   midodrine (PROAMATINE) 5 MG tablet Take 1 tablet (5 mg total) by  mouth 2 (two) times daily with a meal. 60 tablet 0   mirtazapine (REMERON) 7.5 MG tablet Take 1 tablet (7.5 mg total) by mouth at bedtime. 30 tablet 2   rosuvastatin (CRESTOR) 20 MG tablet Take 1 tablet (20 mg total) by mouth daily. 30 tablet 0   traZODone (DESYREL) 50 MG tablet Take 1 tablet (50 mg total) by mouth at bedtime as needed for sleep. 30 tablet 0   vortioxetine HBr (TRINTELLIX) 5 MG TABS tablet Take 1 tablet (5 mg total) by mouth daily. 30 tablet 0   No current facility-administered medications for this visit.    Medication Side Effects: None  Allergies: No Known Allergies  Past Medical History:  Diagnosis Date   Borderline hyperlipidemia    as of 10/2016: TC 134, TG 90, HDL 51, LDL 95 (PCP recently increase rosuvastatin dose from 10 to 20 mg)   Borderline hypertension    Coronary artery disease involving native coronary artery of native heart without angina pectoris 03/01/2020   [facility]:; Dr. Adin Hector:  multivessel CAD => 50% pLAD & 60% mLAD, 70% pLCx & 90% mLCx,  70% mid RCA => CABG x 4   Family history of premature coronary artery disease 08/26/2017   Hx of CABG x 4 03/19/2020   [facility], [place], [place] -> CABG x4 (LIMA-LAD, SVG-OM1, SVG-OM2, SVG-dRCA   Hyperlipidemia due to dietary fat intake 08/26/2017    Past Medical History, Surgical history, Social history, and Family history were reviewed and updated as appropriate.   Please see review of systems for further details on the patient's review from today.   Objective:   Physical Exam:  There were no vitals taken for this visit.  Physical Exam Constitutional:      General: She is not in acute distress. Musculoskeletal:        General: No deformity.  Neurological:     Mental Status: She is alert and oriented to person, place, and time.     Coordination: Coordination normal.  Psychiatric:        Attention and Perception: Attention and  perception normal. She does not perceive auditory or visual hallucinations.        Mood and Affect: Mood is anxious and depressed. Affect is not labile, blunt, angry or inappropriate.        Speech: Speech normal.        Behavior: Behavior normal.        Thought Content: Thought content normal. Thought content is not paranoid or delusional. Thought content does not include homicidal or suicidal ideation. Thought content does not include homicidal or suicidal plan.        Cognition and Memory: Cognition and memory normal.        Judgment: Judgment normal.     Comments: Insight intact     Lab Review:     Component Value Date/Time   NA 141 07/16/2022 1836   NA 141 10/16/2017 1412   K 4.0 07/16/2022 1836   CL 105 07/16/2022 1836   CO2 25 07/16/2022 1836   GLUCOSE 167 (H) 07/16/2022 1836   BUN 10 07/16/2022 1836   BUN 14 10/16/2017 1412  CREATININE 1.05 (H) 07/16/2022 1836   CALCIUM 9.1 07/16/2022 1836   PROT 5.8 (L) 07/16/2022 1836   PROT 6.0 03/03/2022 0833   ALBUMIN 3.9 07/16/2022 1836   ALBUMIN 4.1 03/03/2022 0833   AST 24 07/16/2022 1836   ALT 20 07/16/2022 1836   ALKPHOS 75 07/16/2022 1836   BILITOT 0.8 07/16/2022 1836   BILITOT 0.5 03/03/2022 0833   GFRNONAA >60 07/16/2022 1836   GFRAA 95 10/16/2017 1412       Component Value Date/Time   WBC 5.9 07/16/2022 1836   RBC 4.40 07/16/2022 1836   HGB 13.5 07/16/2022 1836   HCT 40.6 07/16/2022 1836   PLT 209 07/16/2022 1836   MCV 92.3 07/16/2022 1836   MCH 30.7 07/16/2022 1836   MCHC 33.3 07/16/2022 1836   RDW 11.9 07/16/2022 1836   LYMPHSABS 1.8 07/16/2022 1836   MONOABS 0.4 07/16/2022 1836   EOSABS 0.0 07/16/2022 1836   BASOSABS 0.0 07/16/2022 1836    No results found for: "POCLITH", "LITHIUM"   No results found for: "PHENYTOIN", "PHENOBARB", "VALPROATE", "CBMZ"   .res Assessment: Plan:    Plan:  PDMP reviewed  Decrease Clonazepam 0.5mg  BID Trintellix 10mg  daily Ingrezza 40mg  daily at bedtime. Remeron  7.5mg  at hs  Time spent with patient was 25 minutes. Greater than 50% of face to face time with patient was spent on counseling and coordination of care.    Patient totally disabled and unable to work 08/13/2022 through 10/10/2022. Will reassess a return to work date in 8 weeks.  RTC 2 weeks.  Patient advised to contact office with any questions, adverse effects, or acute worsening in signs and symptoms.   Discussed potential benefits, risk, and side effects of benzodiazepines to include potential risk of tolerance and dependence, as well as possible drowsiness.  Advised patient not to drive if experiencing drowsiness and to take lowest possible effective dose to minimize risk of dependence and tolerance.   Discussed potential metabolic side effects associated with atypical antipsychotics, as well as potential risk for movement side effects. Advised pt to contact office if movement side effects occur.  Diagnoses and all orders for this visit:  Major depressive disorder, recurrent episode, moderate (HCC)  Generalized anxiety disorder  Insomnia, unspecified type  Tardive dyskinesia  Akathisia  Panic attacks     Please see After Visit Summary for patient specific instructions.  Future Appointments  Date Time Provider Department Center  09/03/2022  3:20 PM Thaddeaus Monica, Thereasa Solo, NP [facility] None  09/03/2022  4:00 PM Waldron Session, [facility] None    No orders of the defined types were placed in this encounter.   -------------------------------

## 2022-09-03 ENCOUNTER — Encounter: Payer: Self-pay | Admitting: Adult Health

## 2022-09-03 ENCOUNTER — Ambulatory Visit: Payer: BC Managed Care – PPO | Admitting: Adult Health

## 2022-09-03 ENCOUNTER — Ambulatory Visit: Payer: BC Managed Care – PPO | Admitting: Mental Health

## 2022-09-03 DIAGNOSIS — G2401 Drug induced subacute dyskinesia: Secondary | ICD-10-CM

## 2022-09-03 DIAGNOSIS — F410 Panic disorder [episodic paroxysmal anxiety] without agoraphobia: Secondary | ICD-10-CM

## 2022-09-03 DIAGNOSIS — F331 Major depressive disorder, recurrent, moderate: Secondary | ICD-10-CM | POA: Diagnosis not present

## 2022-09-03 DIAGNOSIS — F411 Generalized anxiety disorder: Secondary | ICD-10-CM

## 2022-09-03 DIAGNOSIS — G2571 Drug induced akathisia: Secondary | ICD-10-CM

## 2022-09-03 DIAGNOSIS — G4700 Insomnia, unspecified: Secondary | ICD-10-CM

## 2022-09-03 NOTE — Progress Notes (Signed)
Heather Lucas [id number] Mar 03, 1960 62 y.o.  Subjective:   Patient ID:  Heather Lucas is a 62 y.o. (DOB 13-May-1960) female.  Chief Complaint: No chief complaint on file.   HPI Heather Lucas presents to the office today for follow-up of DD, GAD, panic attacks and TD.  08/02/2021 - initial evaluation. Reports she moved back to [place] from [place] where she lived for 2 and 1/2 years. Now living alone in [place]. Has friends in the area and has adjusted to moving back to the area. While living in [place] was seen by a telemedicine provider and was started on Abilify 20mg  and Olanzapine 7.5mg . She was also taking Trintellix when she initially moved to [place] and had Xanax as needed. Reports feeling more anxious and a little depressed after the move. Mostly staying home - works from home and does not get out much. Felt like she was having some adjustment issues and reached out to a tele health company for assistance. Her medications at initial visit were: Lexapro 10mg  daily, Abilify 10mg  daily,Zyprexa 7.5mg  at hs and Xanax 0.5mg  daily. Reports TD and akathisia with that medication regimen and felt very uncomfortable. Reports peri oral mouth movements, legs shaking, hands shaking, left hand shaking started all which started after initiating antipsychotic medications.  Accompanied by a friend for today's visit.  Describes mood today as ok". Pleasant. Denies tearfulness. Mood symptoms - denies depression and irritability. Reports increased anxiety with movement disorder. Denies panic attacks. Reports decreased worry, rumination, and over thinking. Mood is variable. Feels like medications are helpful. Reports she continues to struggle with symptoms associated with Tardive Dyskinesia and akathisia - mouth movements, arm and leg movements. Stating "it has gotten worse". Has been unable to work or do other tasks with the involuntary movements - receiving STD benefits. Reports decreased interest and  motivation. Taking medications as prescribed.  Energy levels low. Active, does not have a regular exercise routine. Enjoys some usual interests and activities. Single. Lives alone. Brother in [place]. Friends local. Attends church. Appetite adequate. Weight gain - 112 to 117 pounds - 61". Sleeps well most nights. Averages 14 hours of being in the bed.  Focus and concentration difficulties - unable to work with TD and akthesia symptoms. Completing minimal tasks. Managing some aspects of household. 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    Previous medication trials:  Zoloft, Prozac, Wellbutrin, Lexapro, Trintellix, Hydroxyzine.   AIMS    Flowsheet Row Office Visit from 12/13/2021 in [facility]  AIMS Total Score 28      Mini-Mental    Flowsheet Row Office Visit from 07/30/2022 in [facility]  Total Score (max 30 points ) 30        Review of Systems:  Review of Systems  Musculoskeletal:  Negative for gait problem.  Neurological:  Negative for tremors.  Psychiatric/Behavioral:         Please refer to HPI    Medications: I have reviewed the patient's current medications.  Current Outpatient Medications  Medication Sig Dispense Refill   clonazePAM (KLONOPIN) 1 MG tablet TAKE ONE TABLET BY MOUTH TWICE DAILY 60 tablet 0   clopidogrel (PLAVIX) 75 MG tablet Take 1 tablet (75 mg total) by mouth daily. 30 tablet 0   ezetimibe (ZETIA) 10 MG tablet Take 1 tablet (10 mg total) by mouth daily. 30 tablet 0   midodrine (PROAMATINE) 5 MG tablet Take 1 tablet (5 mg total) by mouth  2 (two) times daily with a meal. 60 tablet 0   mirtazapine (REMERON) 7.5 MG tablet Take 1 tablet (7.5 mg total) by mouth at bedtime. 30 tablet 2   rosuvastatin (CRESTOR) 20 MG tablet Take 1 tablet (20 mg total) by mouth daily. 30 tablet 0   traZODone (DESYREL) 50 MG tablet Take 1 tablet (50 mg total) by mouth at bedtime as  needed for sleep. 30 tablet 0   vortioxetine HBr (TRINTELLIX) 5 MG TABS tablet Take 1 tablet (5 mg total) by mouth daily. 30 tablet 0   No current facility-administered medications for this visit.    Medication Side Effects: None  Allergies: No Known Allergies  Past Medical History:  Diagnosis Date   Borderline hyperlipidemia    as of 10/2016: TC 134, TG 90, HDL 51, LDL 95 (PCP recently increase rosuvastatin dose from 10 to 20 mg)   Borderline hypertension    Coronary artery disease involving native coronary artery of native heart without angina pectoris 03/01/2020   [facility]:; Dr. Adin Hector:  multivessel CAD => 50% pLAD & 60% mLAD, 70% pLCx & 90% mLCx,  70% mid RCA => CABG x 4   Family history of premature coronary artery disease 08/26/2017   Hx of CABG x 4 03/19/2020   [facility], [place], [place] -> CABG x4 (LIMA-LAD, SVG-OM1, SVG-OM2, SVG-dRCA   Hyperlipidemia due to dietary fat intake 08/26/2017    Past Medical History, Surgical history, Social history, and Family history were reviewed and updated as appropriate.   Please see review of systems for further details on the patient's review from today.   Objective:   Physical Exam:  There were no vitals taken for this visit.  Physical Exam Constitutional:      General: She is not in acute distress. Musculoskeletal:        General: No deformity.  Neurological:     Mental Status: She is alert and oriented to person, place, and time.     Coordination: Coordination normal.  Psychiatric:        Attention and Perception: Attention and perception normal. She does not perceive auditory or visual hallucinations.        Mood and Affect: Affect is not labile, blunt, angry or inappropriate.        Speech: Speech normal.        Behavior: Behavior normal.        Thought Content: Thought content normal. Thought content is not paranoid or delusional. Thought content does  not include homicidal or suicidal ideation. Thought content does not include homicidal or suicidal plan.        Cognition and Memory: Cognition and memory normal.        Judgment: Judgment normal.     Comments: Insight intact     Lab Review:     Component Value Date/Time   NA 141 07/16/2022 1836   NA 141 10/16/2017 1412   K 4.0 07/16/2022 1836   CL 105 07/16/2022 1836   CO2 25 07/16/2022 1836   GLUCOSE 167 (H) 07/16/2022 1836   BUN 10 07/16/2022 1836   BUN 14 10/16/2017 1412   CREATININE 1.05 (H) 07/16/2022 1836   CALCIUM 9.1 07/16/2022 1836   PROT 5.8 (L) 07/16/2022 1836   PROT 6.0 03/03/2022 0833   ALBUMIN 3.9 07/16/2022 1836   ALBUMIN 4.1 03/03/2022 0833   AST 24 07/16/2022 1836   ALT 20 07/16/2022 1836   ALKPHOS 75 07/16/2022 1836  BILITOT 0.8 07/16/2022 1836   BILITOT 0.5 03/03/2022 0833   GFRNONAA >60 07/16/2022 1836   GFRAA 95 10/16/2017 1412       Component Value Date/Time   WBC 5.9 07/16/2022 1836   RBC 4.40 07/16/2022 1836   HGB 13.5 07/16/2022 1836   HCT 40.6 07/16/2022 1836   PLT 209 07/16/2022 1836   MCV 92.3 07/16/2022 1836   MCH 30.7 07/16/2022 1836   MCHC 33.3 07/16/2022 1836   RDW 11.9 07/16/2022 1836   LYMPHSABS 1.8 07/16/2022 1836   MONOABS 0.4 07/16/2022 1836   EOSABS 0.0 07/16/2022 1836   BASOSABS 0.0 07/16/2022 1836    No results found for: "POCLITH", "LITHIUM"   No results found for: "PHENYTOIN", "PHENOBARB", "VALPROATE", "CBMZ"   .res Assessment: Plan:    Plan:  PDMP reviewed  Buspar 10mg  daily. Trazadone 50mg  daily  Decrease Clonazepam 0.5mg  BID Trintellix 10mg  daily Ingrezza 40mg  daily at bedtime. Remeron 7.5mg  at hs  Time spent with patient was 25 minutes. Greater than 50% of face to face time with patient was spent on counseling and coordination of care.    Patient totally disabled and unable to work 08/13/2022 through 10/10/2022. Will reassess a return to work date on August 30th visit.  RTC 1 week.  Patient  advised to contact office with any questions, adverse effects, or acute worsening in signs and symptoms.   Discussed potential benefits, risk, and side effects of benzodiazepines to include potential risk of tolerance and dependence, as well as possible drowsiness.  Advised patient not to drive if experiencing drowsiness and to take lowest possible effective dose to minimize risk of dependence and tolerance.    There are no diagnoses linked to this encounter.   Please see After Visit Summary for patient specific instructions.  Future Appointments  Date Time Provider Department Center  09/03/2022  4:00 PM Waldron Session, [facility] None  11/17/2022  2:30 PM Levert Feinstein, MD [facility] None    No orders of the defined types were placed in this encounter.   -------------------------------

## 2022-09-03 NOTE — Progress Notes (Signed)
[facility]   Name: Heather Lucas Date: 09/03/2022 MRN: [id number] DOB: 08/02/1960 PCP: Cleatis Polka., MD   Time spent:  51 minutes   Treatment:  Ind. therapy   Mental Status Exam:         Appearance:    Casual      Behavior:   Appropriate   Motor:   WNL   Speech/Language:    Clear and Coherent   Affect:   Full range    Mood:   anxious   Thought process:   Logical, linear, goal directed   Thought content:     WNL   Sensory/Perceptual disturbances:     none   Orientation:   x4   Attention:   Good   Concentration:   Good   Memory:   Intact   Fund of knowledge:    Consistent with age and development   Insight:     Good   Judgment:    Good   Impulse Control:   Good       Reported Symptoms:  anxiety daily (10/10), depression (3/10)   Risk Assessment: Danger to Self:  No Self-injurious Behavior: No Danger to Others: No Duty to Warn:no Physical Aggression / Violence:No  Access to Firearms a concern: No  Gang Involvement:No  Patient / guardian was educated about steps to take if suicide or homicide risk level increases between visits: yes While future psychiatric events cannot be accurately predicted, the patient does not currently require acute inpatient psychiatric care and does not currently meet [facility] involuntary commitment criteria.   Medical History/Surgical History:     Past Medical History:  Diagnosis Date   Agatston coronary artery calcium score between 100 and 199 08/26/2017   Borderline hyperlipidemia      as of 10/2016: TC 134, TG 90, HDL 51, LDL 95 (PCP recently increase rosuvastatin dose from 10 to 20 mg)   Borderline hypertension     Coronary artery disease involving native coronary artery of native heart without angina pectoris 02/18/2020   Family history of premature coronary artery disease 08/26/2017   Hx of CABG x 4 06/17/2021   Hyperlipidemia due to dietary fat intake 08/26/2017   Ischemic cardiomyopathy 06/17/2021            Past Surgical History:  Procedure Laterality Date   CORONARY ARTERY BYPASS GRAFT   03/2020    [facility], [place], [place] -> reportedly CABG x4   LAPAROSCOPIC CHOLECYSTECTOMY   1995   LEFT HEART CATH AND CORONARY ANGIOGRAPHY   02/2020    [facility] [place], [place]) -> multivessel disease, referred for CABG   NM MYOVIEW LTD   02/2018    Clearly appears abnormal, referred for Apolin[facility] Junes, [place]   TRANSTHORACIC ECHOCARDIOGRAM   02/2020    Several echoes in January and February 2022-initially noted to be abnormal along with Myoview stress test, immediately post CABG recheck      Medications:       Current Outpatient Medications  Medication Sig Dispense Refill   aspirin 81 MG EC tablet Take 81 mg by mouth daily.       buPROPion (WELLBUTRIN XL) 300 MG 24 hr tablet Take 1 tablet (300 mg total) by mouth daily. 30 tablet 2   clonazePAM (KLONOPIN) 1 MG tablet TAKE ONE TABLET BY MOUTH TWICE DAILY 60 tablet 2   clopidogrel (PLAVIX) 75 MG tablet Take 75 mg by mouth daily.       ergocalciferol (VITAMIN D2) 1.25  MG (50000 UT) capsule Take 50,000 Units by mouth daily.       ezetimibe (ZETIA) 10 MG tablet Take 1 tablet (10 mg total) by mouth daily. 90 tablet 3   midodrine (PROAMATINE) 5 MG tablet Take 5 mg  in the morning  and take 2.5 mg ( 1/2 tablet ) in the evening.       mirtazapine (REMERON) 15 MG tablet Take 1 tablet (15 mg total) by mouth at bedtime. 30 tablet 2   rosuvastatin (CRESTOR) 20 MG tablet Take 20 mg by mouth daily.   5   Sennosides 15 MG TABS Take 15 mg by mouth daily. Takes 8 tablets a night       valbenazine (INGREZZA) 40 MG capsule Take 1 capsule (40 mg total) by mouth daily. 30 capsule 5   valbenazine (INGREZZA) 40 MG capsule Take 1 capsule (40 mg total) by mouth daily. 30 capsule 0    No current facility-administered medications for this visit.        Subjective:  Patient presents on time for today's session with her friend with consent.   Assessed progress where patient shared how she is now on short-term disability due to increased tardive dyskinesia symptoms;  Patient is noticeably shaking in session.  She stated that these symptoms increased about 3 weeks ago progressively over just a few days and have persisted since.  She stated the only time she is not dealing with the shaking is when she is asleep.  Reports mood as "okay".  But also reports low motivation and wanting to sleep all day.  She currently is not working as a result.  She has her support system which included one of her friends who is present today.  She is able to get out of the house from time to time to go to her doctor appointments and spend some time with friends.  She identified low motivation most days, sleeping excessively at night and through out the day.  She verbally expressed some resistance to wanting to work on behavioral change such as showering daily and trying to engage in some walking exercise, although she stated she probably needs to start.  We reviewed some content from previous sessions related to coping and trying to engage in behaviors to start.  Encouraged her to start 1 behavior, showering daily after waking up to start her day.  Interventions: CBT, supportive therapy, motivational interviewing   Diagnoses:      ICD-10-CM    1. Major depressive disorder, recurrent episode, moderate (HCC)  F33.1                 Plan: Patient is to use CBT, mindfulness and coping skills to help manage decrease symptoms associated with their diagnosis.     Long-term goals:   Maintain symptom reduction: The patient will report sustained reduction in symptoms of anxiety using both CBT and mindfulness interventions for 3 consecutive months progressively.  Improve emotional regulation: The patient will learn and apply CBT and mindfulness-based strategies to regulate emotions, such as mindfulness-based stress reduction and cognitive restructuring, and report an  improvement in emotional regulation for at least 3 consecutive months progressively.   Short-term goal:  The patient will learn and apply CBT and mindfulness-based coping skills for managing anxiety and practice using it between sessions.       2.   Increase awareness of automatic thoughts: The patient will learn to identify automatic thoughts and increase awareness of their impact on emotions and behaviors  through both CBT and mindfulness-based interventions.       3.    Identify, challenge, and replace fearful/anxious/depressive self talk with positive, realistic, and empowering self talk that does not feed anxiety nor depression.         Waldron Session, [facility]

## 2022-09-04 ENCOUNTER — Telehealth: Payer: Self-pay | Admitting: Adult Health

## 2022-09-04 NOTE — Telephone Encounter (Addendum)
Patient just seen yesterday. Called to see what had changed between yesterday and today. She said the SE were just too much from the Ingrezza. Speech is sluggish and she has trouble speaking. I asked her if she was okay and she said she has memory issues. She has FU 8/1 and I told her she needed to discuss with you, but she said she needs to start coming off now.   Patient called back and said she could wait to talk to you on 8/1 to discuss coming off meds.

## 2022-09-04 NOTE — Telephone Encounter (Signed)
Heather Lucas called and wants to go off of all her medications and wants to taper them down. She wants to speak to someone when can tell her how to do this. Her phone number is [phone].

## 2022-09-06 ENCOUNTER — Other Ambulatory Visit: Payer: Self-pay | Admitting: Adult Health

## 2022-09-06 DIAGNOSIS — F331 Major depressive disorder, recurrent, moderate: Secondary | ICD-10-CM

## 2022-09-06 DIAGNOSIS — F411 Generalized anxiety disorder: Secondary | ICD-10-CM

## 2022-09-07 ENCOUNTER — Other Ambulatory Visit: Payer: Self-pay | Admitting: Adult Health

## 2022-09-07 DIAGNOSIS — F411 Generalized anxiety disorder: Secondary | ICD-10-CM

## 2022-09-07 DIAGNOSIS — F331 Major depressive disorder, recurrent, moderate: Secondary | ICD-10-CM

## 2022-09-09 ENCOUNTER — Other Ambulatory Visit: Payer: Self-pay | Admitting: Adult Health

## 2022-09-09 DIAGNOSIS — F411 Generalized anxiety disorder: Secondary | ICD-10-CM

## 2022-09-09 DIAGNOSIS — F331 Major depressive disorder, recurrent, moderate: Secondary | ICD-10-CM

## 2022-09-10 DIAGNOSIS — I10 Essential (primary) hypertension: Secondary | ICD-10-CM | POA: Diagnosis not present

## 2022-09-10 DIAGNOSIS — E559 Vitamin D deficiency, unspecified: Secondary | ICD-10-CM | POA: Diagnosis not present

## 2022-09-10 NOTE — Telephone Encounter (Signed)
Has appt. tomorrow

## 2022-09-10 NOTE — Telephone Encounter (Signed)
Pt has appt tomorrow but she is completely out of Trintellix. She needs a RF so that she can taper off of it. Please send to [facility].THX

## 2022-09-11 ENCOUNTER — Ambulatory Visit (INDEPENDENT_AMBULATORY_CARE_PROVIDER_SITE_OTHER): Payer: BC Managed Care – PPO | Admitting: Adult Health

## 2022-09-11 ENCOUNTER — Encounter: Payer: Self-pay | Admitting: Adult Health

## 2022-09-11 DIAGNOSIS — G2401 Drug induced subacute dyskinesia: Secondary | ICD-10-CM | POA: Diagnosis not present

## 2022-09-11 DIAGNOSIS — F411 Generalized anxiety disorder: Secondary | ICD-10-CM | POA: Diagnosis not present

## 2022-09-11 DIAGNOSIS — G2571 Drug induced akathisia: Secondary | ICD-10-CM | POA: Diagnosis not present

## 2022-09-11 DIAGNOSIS — F410 Panic disorder [episodic paroxysmal anxiety] without agoraphobia: Secondary | ICD-10-CM

## 2022-09-11 DIAGNOSIS — F331 Major depressive disorder, recurrent, moderate: Secondary | ICD-10-CM

## 2022-09-11 NOTE — Progress Notes (Signed)
Heather Lucas [id number] Jul 03, 1960 62 y.o. 63 y.o.  Subjective:   Patient ID:  Heather Lucas is a 62 y.o. (DOB 1960/09/20) female.  Chief Complaint: No chief complaint on file.   HPI Heather Lucas presents to the office today for follow-up of MDD, GAD, panic attacks and TD.  08/02/2021 - initial evaluation. Reports she moved back to [place] from [place] where she lived for 2 and 1/2 years. Now living alone in [place]. Has friends in the area and has adjusted to moving back to the area. While living in [place] was seen by a telemedicine provider and was started on Abilify 20mg  and Olanzapine 7.5mg . She was also taking Trintellix when she initially moved to [place] and had Xanax as needed. Reports feeling more anxious and a little depressed after the move. Mostly staying home - works from home and does not get out much. Felt like she was having some adjustment issues and reached out to a tele health company for assistance. Her medications at initial visit were: Lexapro 10mg  daily, Abilify 10mg  daily,Zyprexa 7.5mg  at hs and Xanax 0.5mg  daily. Reports TD and akathisia with that medication regimen and felt very uncomfortable. Reports peri oral mouth movements, legs shaking, hands shaking, left hand shaking started all which started after initiating antipsychotic medications.  Describes mood today as "no different". Pleasant. Denies tearfulness. Mood symptoms - reports depression and anxiety. Denies irritability. Denies panic attacks. Reports decreased worry, rumination, and over thinking. Mood is variable. Does not feel like current medication regimen is helping and would like to taper off and see how she does with out them. Reports worsening symptoms of Tardive Dyskinesia and akathisia - mouth movements, arm and leg movements. Stating "it's not getting any better". Reports increasing to 80mg  daily. Has been unable to work or do other tasks with the involuntary movements. She is receiving STD benefits  through employer. Reports decreased interest and motivation. Taking medications as prescribed.  Energy levels low. Active, does not have a regular exercise routine. Enjoys some usual interests and activities. Single. Lives alone. Brother in [place]. Friends local. Attends church. Appetite adequate. Weight gain - 117 pounds - 61". Sleeping better some nights than others. Averages 14 hours of being in the bed.  Focus and concentration difficulties - unable to work with TD and akthesia symptoms. Completing minimal tasks. Managing some aspects of household. 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  Previous medication trials:  Zoloft, Prozac, Wellbutrin, Lexapro, Trintellix, Hydroxyzine.   AIMS    Flowsheet Row Office Visit from 12/13/2021 in [facility]  AIMS Total Score 28      Mini-Mental    Flowsheet Row Office Visit from 07/30/2022 in [facility]  Total Score (max 30 points ) 30        Review of Systems:  Review of Systems  Musculoskeletal:  Negative for gait problem.  Neurological:  Negative for tremors.  Psychiatric/Behavioral:         Please refer to HPI    Medications: I have reviewed the patient's current medications.  Current Outpatient Medications  Medication Sig Dispense Refill   clonazePAM (KLONOPIN) 1 MG tablet TAKE ONE TABLET BY MOUTH TWICE DAILY 60 tablet 0   clopidogrel (PLAVIX) 75 MG tablet Take 1 tablet (75 mg total) by mouth daily. 30 tablet 0   ezetimibe (ZETIA) 10 MG tablet Take 1 tablet (10 mg total) by mouth daily. 30 tablet 0   midodrine (PROAMATINE) 5 MG  tablet Take 1 tablet (5 mg total) by mouth 2 (two) times daily with a meal. 60 tablet 0   mirtazapine (REMERON) 7.5 MG tablet Take 1 tablet (7.5 mg total) by mouth at bedtime. 30 tablet 2   rosuvastatin (CRESTOR) 20 MG tablet Take 1 tablet (20 mg total) by mouth daily. 30 tablet 0   traZODone (DESYREL) 50 MG tablet  Take 1 tablet (50 mg total) by mouth at bedtime as needed for sleep. 30 tablet 0   vortioxetine HBr (TRINTELLIX) 5 MG TABS tablet Take 1 tablet (5 mg total) by mouth daily. 30 tablet 0   No current facility-administered medications for this visit.    Medication Side Effects: None  Allergies: No Known Allergies  Past Medical History:  Diagnosis Date   Borderline hyperlipidemia    as of 10/2016: TC 134, TG 90, HDL 51, LDL 95 (PCP recently increase rosuvastatin dose from 10 to 20 mg)   Borderline hypertension    Coronary artery disease involving native coronary artery of native heart without angina pectoris 03/01/2020   [facility]:; Dr. Adin Hector:  multivessel CAD => 50% pLAD & 60% mLAD, 70% pLCx & 90% mLCx,  70% mid RCA => CABG x 4   Family history of premature coronary artery disease 08/26/2017   [facility] [place] [place] [facility] CABG x4 (LIMA-LAD, SVG-OM1, SVG-OM2, SVG-dRCA   Hyperlipidemia due to dietary fat intake 08/26/2017    Past Medical History, Surgical history, Social history, and Family history were reviewed and updated as appropriate.   Please see review of systems for further details on the patient's review from today.   Objective:   Physical Exam:  There were no vitals taken for this visit.  Physical Exam Neurological:     Mental Status: She is alert and oriented to person, place, and time.     Motor: Tremor present.     Comments: TD  Psychiatric:        Mood and Affect: Mood is anxious and depressed. Affect is flat.     Lab Review:     Component Value Date/Time   NA 141 07/16/2022 1836   NA 141 10/16/2017 1412   K 4.0 07/16/2022 1836   CL 105 07/16/2022 1836   CO2 25 07/16/2022 1836   GLUCOSE 167 (H) 07/16/2022 1836   BUN 10 07/16/2022 1836   BUN 14 10/16/2017 1412   CREATININE 1.05 (H) 07/16/2022 1836   CALCIUM 9.1 07/16/2022 1836   PROT 5.8 (L) 07/16/2022 1836    PROT 6.0 03/03/2022 0833   ALBUMIN 3.9 07/16/2022 1836   ALBUMIN 4.1 03/03/2022 0833   AST 24 07/16/2022 1836   ALT 20 07/16/2022 1836   ALKPHOS 75 07/16/2022 1836   BILITOT 0.8 07/16/2022 1836   BILITOT 0.5 03/03/2022 0833   GFRNONAA >60 07/16/2022 1836   GFRAA 95 10/16/2017 1412       Component Value Date/Time   WBC 5.9 07/16/2022 1836   RBC 4.40 07/16/2022 1836   HGB 13.5 07/16/2022 1836   HCT 40.6 07/16/2022 1836   PLT 209 07/16/2022 1836   MCV 92.3 07/16/2022 1836   MCH 30.7 07/16/2022 1836   MCHC 33.3 07/16/2022 1836   RDW 11.9 07/16/2022 1836   LYMPHSABS 1.8 07/16/2022 1836   MONOABS 0.4 07/16/2022 1836   EOSABS 0.0 07/16/2022 1836   BASOSABS 0.0 07/16/2022 1836    No results found for: "POCLITH", "LITHIUM"  No results found for: "PHENYTOIN", "PHENOBARB", "VALPROATE", "CBMZ"   .res Assessment: Plan:    Plan:  PDMP reviewed  Patient asking to taper off all medications to see if any benefit. Offered transition to [facility], but declined.   Staffed case with Dr. Jennelle Human spoke with patient and completed a targeted physical exam.   D/C Ingrezza 80mg  daily at bedtime - taper instructions given. D/C Buspar 10mg  daily - taper instructions given. D/C Trazadone 50mg  daily - taper instructions given. D/C Clonazepam 0.5mg  BID - taper instructions given. D/C Remeron 7.5mg  at hs - taper instructions given. D/C Trintellix 10mg  daily - taper instructions given.  Patient totally disabled and unable to work 08/13/2022 through 10/10/2022. Will reassess a return to work date on August 30th visit.  RTC 1 week.  Patient advised to contact office with any questions, adverse effects, or acute worsening in signs and symptoms.   Discussed potential benefits, risk, and side effects of benzodiazepines to include potential risk of tolerance and dependence, as well as possible drowsiness.  Advised patient not to drive if experiencing drowsiness and to take lowest possible effective  dose to minimize risk of dependence and tolerance.   There are no diagnoses linked to this encounter.   Please see After Visit Summary for patient specific instructions.  Future Appointments  Date Time Provider Department Center  09/18/2022  3:00 PM Waldron Session, [facility] None  11/17/2022  2:30 PM Levert Feinstein, MD [facility] None    No orders of the defined types were placed in this encounter.   -------------------------------

## 2022-09-12 DIAGNOSIS — I10 Essential (primary) hypertension: Secondary | ICD-10-CM | POA: Diagnosis not present

## 2022-09-12 DIAGNOSIS — J029 Acute pharyngitis, unspecified: Secondary | ICD-10-CM | POA: Diagnosis not present

## 2022-09-15 DIAGNOSIS — R7301 Impaired fasting glucose: Secondary | ICD-10-CM | POA: Diagnosis not present

## 2022-09-15 DIAGNOSIS — Z0000 Encounter for general adult medical examination without abnormal findings: Secondary | ICD-10-CM | POA: Diagnosis not present

## 2022-09-15 DIAGNOSIS — Z1331 Encounter for screening for depression: Secondary | ICD-10-CM | POA: Diagnosis not present

## 2022-09-15 DIAGNOSIS — Z1339 Encounter for screening examination for other mental health and behavioral disorders: Secondary | ICD-10-CM | POA: Diagnosis not present

## 2022-09-15 DIAGNOSIS — I10 Essential (primary) hypertension: Secondary | ICD-10-CM | POA: Diagnosis not present

## 2022-09-17 DIAGNOSIS — J020 Streptococcal pharyngitis: Secondary | ICD-10-CM | POA: Diagnosis not present

## 2022-09-18 ENCOUNTER — Emergency Department (HOSPITAL_COMMUNITY)
Admission: EM | Admit: 2022-09-18 | Discharge: 2022-09-19 | Disposition: A | Discharge status: Home / Self Care | Payer: BC Managed Care – PPO | Attending: Emergency Medicine | Admitting: Emergency Medicine

## 2022-09-18 ENCOUNTER — Ambulatory Visit: Payer: BC Managed Care – PPO | Admitting: Mental Health

## 2022-09-18 ENCOUNTER — Other Ambulatory Visit: Payer: Self-pay

## 2022-09-18 DIAGNOSIS — R251 Tremor, unspecified: Secondary | ICD-10-CM | POA: Insufficient documentation

## 2022-09-18 DIAGNOSIS — G4700 Insomnia, unspecified: Secondary | ICD-10-CM

## 2022-09-18 DIAGNOSIS — Z7902 Long term (current) use of antithrombotics/antiplatelets: Secondary | ICD-10-CM | POA: Diagnosis not present

## 2022-09-18 DIAGNOSIS — E875 Hyperkalemia: Secondary | ICD-10-CM | POA: Insufficient documentation

## 2022-09-18 DIAGNOSIS — E860 Dehydration: Secondary | ICD-10-CM | POA: Diagnosis not present

## 2022-09-18 DIAGNOSIS — T887XXA Unspecified adverse effect of drug or medicament, initial encounter: Secondary | ICD-10-CM | POA: Diagnosis not present

## 2022-09-18 DIAGNOSIS — E876 Hypokalemia: Secondary | ICD-10-CM | POA: Diagnosis not present

## 2022-09-18 DIAGNOSIS — R531 Weakness: Secondary | ICD-10-CM | POA: Insufficient documentation

## 2022-09-18 DIAGNOSIS — G2401 Drug induced subacute dyskinesia: Secondary | ICD-10-CM | POA: Diagnosis not present

## 2022-09-18 DIAGNOSIS — T50904A Poisoning by unspecified drugs, medicaments and biological substances, undetermined, initial encounter: Secondary | ICD-10-CM | POA: Diagnosis not present

## 2022-09-18 LAB — COMPREHENSIVE METABOLIC PANEL
ALT: 18 U/L (ref 0–44)
AST: 26 U/L (ref 15–41)
Albumin: 4.5 g/dL (ref 3.5–5.0)
Alkaline Phosphatase: 63 U/L (ref 38–126)
Anion gap: 12 (ref 5–15)
BUN: 10 mg/dL (ref 8–23)
CO2: 24 mmol/L (ref 22–32)
Calcium: 9.1 mg/dL (ref 8.9–10.3)
Chloride: 101 mmol/L (ref 98–111)
Creatinine, Ser: 0.76 mg/dL (ref 0.44–1.00)
GFR, Estimated: >60 mL/min (ref >60)
Glucose, Bld: 98 mg/dL (ref 70–99)
Potassium: 2.6 mmol/L — CL (ref 3.5–5.1)
Sodium: 137 mmol/L (ref 135–145)
Total Bilirubin: 0.9 mg/dL (ref 0.3–1.2)
Total Protein: 7.3 g/dL (ref 6.5–8.1)

## 2022-09-18 LAB — URINALYSIS, ROUTINE W REFLEX MICROSCOPIC
Bacteria, UA: NONE SEEN
Bilirubin Urine: NEGATIVE
Glucose, UA: NEGATIVE mg/dL
Hgb urine dipstick: NEGATIVE
Ketones, ur: 5 mg/dL — AB
Nitrite: NEGATIVE
Protein, ur: NEGATIVE mg/dL
Specific Gravity, Urine: 1.013 (ref 1.005–1.030)
pH: 6 (ref 5.0–8.0)

## 2022-09-18 LAB — CBC WITH DIFFERENTIAL/PLATELET
Abs Immature Granulocytes: 0.02 10*3/uL (ref 0.00–0.07)
Basophils Absolute: 0 10*3/uL (ref 0.0–0.1)
Basophils Relative: 0 %
Eosinophils Absolute: 0 10*3/uL (ref 0.0–0.5)
Eosinophils Relative: 0 %
HCT: 37.9 % (ref 36.0–46.0)
Hemoglobin: 12.9 g/dL (ref 12.0–15.0)
Immature Granulocytes: 0 %
Lymphocytes Relative: 10 %
Lymphs Abs: 0.9 10*3/uL (ref 0.7–4.0)
MCH: 30.9 pg (ref 26.0–34.0)
MCHC: 34 g/dL (ref 30.0–36.0)
MCV: 90.9 fL (ref 80.0–100.0)
Monocytes Absolute: 0.7 10*3/uL (ref 0.1–1.0)
Monocytes Relative: 8 %
Neutro Abs: 7.5 10*3/uL (ref 1.7–7.7)
Neutrophils Relative %: 82 %
Platelets: 196 10*3/uL (ref 150–400)
RBC: 4.17 MIL/uL (ref 3.87–5.11)
RDW: 11.7 % (ref 11.5–15.5)
WBC: 9.2 10*3/uL (ref 4.0–10.5)
nRBC: 0 % (ref 0.0–0.2)

## 2022-09-18 MED ORDER — LORAZEPAM 2 MG/ML IJ SOLN
0.5000 mg | Freq: Once | INTRAMUSCULAR | Status: AC
  Administered 2022-09-18: 0.5 mg via INTRAVENOUS
  Filled 2022-09-18: qty 1

## 2022-09-18 MED ORDER — LACTATED RINGERS IV SOLN: INTRAVENOUS | Status: DC

## 2022-09-18 MED ORDER — POTASSIUM CHLORIDE ER 10 MEQ PO TBCR: 10.0000 meq | EXTENDED_RELEASE_TABLET | Freq: Two times a day (BID) | ORAL | 0 refills | Status: DC

## 2022-09-18 MED ORDER — LACTATED RINGERS IV BOLUS
1000.0000 mL | Freq: Once | INTRAVENOUS | Status: AC
  Administered 2022-09-18: 1000 mL via INTRAVENOUS

## 2022-09-18 MED ORDER — POTASSIUM CHLORIDE CRYS ER 20 MEQ PO TBCR
60.0000 meq | EXTENDED_RELEASE_TABLET | Freq: Once | ORAL | Status: AC
  Administered 2022-09-18: 60 meq via ORAL
  Filled 2022-09-18: qty 3

## 2022-09-18 MED ORDER — LORAZEPAM 0.5 MG PO TABS: 0.5000 mg | ORAL_TABLET | Freq: Every day | ORAL | 0 refills | Status: DC

## 2022-09-18 MED ORDER — POTASSIUM CHLORIDE 10 MEQ/100ML IV SOLN
10.0000 meq | INTRAVENOUS | Status: AC
  Administered 2022-09-18: 10 meq via INTRAVENOUS
  Filled 2022-09-18: qty 100

## 2022-09-18 NOTE — ED Triage Notes (Signed)
Pt BIB EMS from home pt states she feels dehydrated.

## 2022-09-18 NOTE — ED Provider Notes (Signed)
[place] EMERGENCY DEPARTMENT AT [facility] Provider Note   CSN: [id number] Arrival date & time: 09/18/22  1808     History  No chief complaint on file.   Heather Lucas is a 62 y.o. female.  62 year old female with history of tardive dyskinesia presents due to increasing weakness as well as insomnia.  Patient has had worsening tardive dyskinesia symptoms and that manifest as patient having right sided upper extremity shaking as well as trouble speaking.  Schedule see her doctor in a few days for similar symptoms.  Her brother called him as a day because it had her speech sounded different.  Patient states that her speech is lower volume but otherwise is okay.  Denies any new weakness.  No fever or chills.       Home Medications Prior to Admission medications   Medication Sig Start Date End Date Taking? Authorizing Provider  clonazePAM (KLONOPIN) 1 MG tablet TAKE ONE TABLET BY MOUTH TWICE DAILY 07/21/22   Clapacs, Jackquline Denmark, MD  clopidogrel (PLAVIX) 75 MG tablet Take 1 tablet (75 mg total) by mouth daily. 07/22/22   Clapacs, Jackquline Denmark, MD  ezetimibe (ZETIA) 10 MG tablet Take 1 tablet (10 mg total) by mouth daily. 07/21/22   Clapacs, Jackquline Denmark, MD  midodrine (PROAMATINE) 5 MG tablet Take 1 tablet (5 mg total) by mouth 2 (two) times daily with a meal. 07/21/22   Clapacs, Jackquline Denmark, MD  mirtazapine (REMERON) 7.5 MG tablet Take 1 tablet (7.5 mg total) by mouth at bedtime. 07/25/22   Mozingo, Thereasa Solo, NP  rosuvastatin (CRESTOR) 20 MG tablet Take 1 tablet (20 mg total) by mouth daily. 07/21/22   Clapacs, Jackquline Denmark, MD  traZODone (DESYREL) 50 MG tablet Take 1 tablet (50 mg total) by mouth at bedtime as needed for sleep. 07/21/22   Clapacs, Jackquline Denmark, MD  TRINTELLIX 5 MG TABS tablet TAKE 1 TABLET ONCE A DAY. 09/11/22   Mozingo, Thereasa Solo, NP      Allergies    Patient has no known allergies.    Review of Systems   Review of Systems  All other systems reviewed and are  negative.   Physical Exam Updated Vital Signs BP (!) 151/89   Pulse 85   Temp 99 F (37.2 C)   Resp 18   SpO2 95%  Physical Exam Vitals and nursing note reviewed.  Constitutional:      General: She is not in acute distress.    Appearance: Normal appearance. She is well-developed. She is not toxic-appearing.  HENT:     Head: Normocephalic and atraumatic.  Eyes:     General: Lids are normal.     Conjunctiva/sclera: Conjunctivae normal.     Pupils: Pupils are equal, round, and reactive to light.  Neck:     Thyroid: No thyroid mass.     Trachea: No tracheal deviation.  Cardiovascular:     Rate and Rhythm: Normal rate and regular rhythm.     Heart sounds: Normal heart sounds. No murmur heard.    No gallop.  Pulmonary:     Effort: Pulmonary effort is normal. No respiratory distress.     Breath sounds: Normal breath sounds. No stridor. No decreased breath sounds, wheezing, rhonchi or rales.  Abdominal:     General: There is no distension.     Palpations: Abdomen is soft.     Tenderness: There is no abdominal tenderness. There is no rebound.  Musculoskeletal:  General: No tenderness. Normal range of motion.     Cervical back: Normal range of motion and neck supple.  Skin:    General: Skin is warm and dry.     Findings: No abrasion or rash.  Neurological:     Mental Status: She is alert and oriented to person, place, and time. Mental status is at baseline.     GCS: GCS eye subscore is 4. GCS verbal subscore is 5. GCS motor subscore is 6.     Cranial Nerves: Cranial nerves are intact. No cranial nerve deficit.     Sensory: No sensory deficit.     Motor: Tremor present.     Comments: Right upper extremity tremors noted  Psychiatric:        Attention and Perception: Attention normal.        Speech: Speech normal.        Behavior: Behavior normal.     ED Results / Procedures / Treatments   Labs (all labs ordered are listed, but only abnormal results are  displayed) Labs Reviewed  CBC WITH DIFFERENTIAL/PLATELET  COMPREHENSIVE METABOLIC PANEL  URINALYSIS, ROUTINE W REFLEX MICROSCOPIC    EKG None  Radiology No results found.  Procedures Procedures    Medications Ordered in ED Medications  lactated ringers bolus 1,000 mL (has no administration in time range)  lactated ringers infusion (has no administration in time range)    ED Course/ Medical Decision Making/ A&P                                 Medical Decision Making Amount and/or Complexity of Data Reviewed Labs: ordered.  Risk Prescription drug management.  Urinalysis noted.  Patient given a liter of lactated Ringer's. Patient has no urinary symptoms at this time.  Will not treat as such.  Mild hyperkalemia noted treated with oral potassium which patient was able to take.  Patient given Ativan as well as much better.  Requesting prescription for same.  Patient has appointment to see her doctor next week for treatment of her tardive dyskinesia.        Final Clinical Impression(s) / ED Diagnoses Final diagnoses:  None    Rx / DC Orders ED Discharge Orders     None         Lorre Nick, MD 09/18/22 2221

## 2022-09-18 NOTE — Discharge Instructions (Signed)
Take the potassium as directed and have your doctor repeat potassium next week.  Use the Ativan as needed to help you sleep.  Keep your appointment with your physician next week

## 2022-09-19 ENCOUNTER — Telehealth: Payer: Self-pay | Admitting: Adult Health

## 2022-09-19 NOTE — Telephone Encounter (Signed)
Pt called at 2:58pm. She was at the hospital last night and was Rxed Ativan. She is asking if it is okay to take Ativan?

## 2022-09-19 NOTE — Telephone Encounter (Signed)
Please see message. She was dx with hypokalemia, TD, and insomnia. She has appt with you on Tuesday.

## 2022-09-20 ENCOUNTER — Other Ambulatory Visit: Payer: Self-pay

## 2022-09-20 ENCOUNTER — Emergency Department (HOSPITAL_COMMUNITY)
Admission: EM | Admit: 2022-09-20 | Discharge: 2022-09-21 | Disposition: A | Discharge status: Other Acute Inpatient Hospital | Payer: BC Managed Care – PPO | Source: Home / Self Care | Attending: Emergency Medicine | Admitting: Emergency Medicine

## 2022-09-20 ENCOUNTER — Encounter (HOSPITAL_COMMUNITY): Payer: Self-pay

## 2022-09-20 DIAGNOSIS — Z7901 Long term (current) use of anticoagulants: Secondary | ICD-10-CM | POA: Insufficient documentation

## 2022-09-20 DIAGNOSIS — Z79899 Other long term (current) drug therapy: Secondary | ICD-10-CM | POA: Insufficient documentation

## 2022-09-20 DIAGNOSIS — Z955 Presence of coronary angioplasty implant and graft: Secondary | ICD-10-CM | POA: Diagnosis not present

## 2022-09-20 DIAGNOSIS — F332 Major depressive disorder, recurrent severe without psychotic features: Secondary | ICD-10-CM | POA: Diagnosis not present

## 2022-09-20 DIAGNOSIS — I2510 Atherosclerotic heart disease of native coronary artery without angina pectoris: Secondary | ICD-10-CM | POA: Insufficient documentation

## 2022-09-20 DIAGNOSIS — I10 Essential (primary) hypertension: Secondary | ICD-10-CM | POA: Diagnosis not present

## 2022-09-20 DIAGNOSIS — R45851 Suicidal ideations: Secondary | ICD-10-CM

## 2022-09-20 DIAGNOSIS — G2401 Drug induced subacute dyskinesia: Secondary | ICD-10-CM | POA: Diagnosis not present

## 2022-09-20 HISTORY — DX: Anxiety disorder, unspecified: F41.9

## 2022-09-20 HISTORY — DX: Depression, unspecified: F32.A

## 2022-09-20 HISTORY — DX: Atherosclerotic heart disease of native coronary artery without angina pectoris: I25.10

## 2022-09-20 LAB — COMPREHENSIVE METABOLIC PANEL
ALT: 21 U/L (ref 0–44)
AST: 30 U/L (ref 15–41)
Albumin: 4.6 g/dL (ref 3.5–5.0)
Alkaline Phosphatase: 69 U/L (ref 38–126)
Anion gap: 14 (ref 5–15)
BUN: 12 mg/dL (ref 8–23)
CO2: 22 mmol/L (ref 22–32)
Calcium: 9.4 mg/dL (ref 8.9–10.3)
Chloride: 105 mmol/L (ref 98–111)
Creatinine, Ser: 0.71 mg/dL (ref 0.44–1.00)
GFR, Estimated: >60 mL/min (ref >60)
Glucose, Bld: 99 mg/dL (ref 70–99)
Potassium: 3.5 mmol/L (ref 3.5–5.1)
Sodium: 141 mmol/L (ref 135–145)
Total Bilirubin: 0.7 mg/dL (ref 0.3–1.2)
Total Protein: 7.7 g/dL (ref 6.5–8.1)

## 2022-09-20 LAB — ETHANOL: Alcohol, Ethyl (B): <10 mg/dL (ref <10)

## 2022-09-20 LAB — CBC
HCT: 40.8 % (ref 36.0–46.0)
Hemoglobin: 13.6 g/dL (ref 12.0–15.0)
MCH: 30.8 pg (ref 26.0–34.0)
MCHC: 33.3 g/dL (ref 30.0–36.0)
MCV: 92.5 fL (ref 80.0–100.0)
Platelets: 209 10*3/uL (ref 150–400)
RBC: 4.41 MIL/uL (ref 3.87–5.11)
RDW: 11.7 % (ref 11.5–15.5)
WBC: 11.2 10*3/uL — ABNORMAL HIGH (ref 4.0–10.5)
nRBC: 0 % (ref 0.0–0.2)

## 2022-09-20 LAB — ACETAMINOPHEN LEVEL: Acetaminophen (Tylenol), Serum: <10 ug/mL — ABNORMAL LOW (ref 10–30)

## 2022-09-20 LAB — SALICYLATE LEVEL: Salicylate Lvl: <7 mg/dL — ABNORMAL LOW (ref 7.0–30.0)

## 2022-09-20 MED ORDER — LACTATED RINGERS IV BOLUS
1000.0000 mL | Freq: Once | INTRAVENOUS | Status: AC
  Administered 2022-09-20: 1000 mL via INTRAVENOUS

## 2022-09-20 MED ORDER — TRAZODONE HCL 50 MG PO TABS
50.0000 mg | ORAL_TABLET | Freq: Every day | ORAL | Status: DC
  Administered 2022-09-20: 50 mg via ORAL
  Filled 2022-09-20: qty 1

## 2022-09-20 MED ORDER — CLOPIDOGREL BISULFATE 75 MG PO TABS
75.0000 mg | ORAL_TABLET | Freq: Every day | ORAL | Status: DC
  Administered 2022-09-21: 75 mg via ORAL
  Filled 2022-09-20: qty 1

## 2022-09-20 MED ORDER — EZETIMIBE 10 MG PO TABS
10.0000 mg | ORAL_TABLET | Freq: Every day | ORAL | Status: DC
  Administered 2022-09-21: 10 mg via ORAL
  Filled 2022-09-20: qty 1

## 2022-09-20 MED ORDER — MIRTAZAPINE 7.5 MG PO TABS
7.5000 mg | ORAL_TABLET | Freq: Every day | ORAL | Status: DC
  Administered 2022-09-20: 7.5 mg via ORAL
  Filled 2022-09-20: qty 1

## 2022-09-20 MED ORDER — DIPHENHYDRAMINE HCL 25 MG PO CAPS: 50.0000 mg | ORAL_CAPSULE | Freq: Once | ORAL | Status: DC

## 2022-09-20 MED ORDER — ROSUVASTATIN CALCIUM 20 MG PO TABS
20.0000 mg | ORAL_TABLET | Freq: Every day | ORAL | Status: DC
  Administered 2022-09-20 – 2022-09-21 (×2): 20 mg via ORAL
  Filled 2022-09-20 (×2): qty 1

## 2022-09-20 MED ORDER — MIDODRINE HCL 5 MG PO TABS
5.0000 mg | ORAL_TABLET | Freq: Two times a day (BID) | ORAL | Status: DC
  Administered 2022-09-21: 5 mg via ORAL
  Filled 2022-09-20: qty 1

## 2022-09-20 MED ORDER — DIPHENHYDRAMINE HCL 50 MG/ML IJ SOLN
50.0000 mg | Freq: Once | INTRAMUSCULAR | Status: AC
  Administered 2022-09-20: 50 mg via INTRAMUSCULAR
  Filled 2022-09-20: qty 1

## 2022-09-20 MED ORDER — LORAZEPAM 0.5 MG PO TABS
0.5000 mg | ORAL_TABLET | Freq: Every day | ORAL | Status: DC
  Administered 2022-09-20: 0.5 mg via ORAL
  Filled 2022-09-20: qty 1

## 2022-09-20 MED ORDER — LORAZEPAM 1 MG PO TABS
1.0000 mg | ORAL_TABLET | Freq: Once | ORAL | Status: AC
  Administered 2022-09-20: 1 mg via ORAL
  Filled 2022-09-20: qty 1

## 2022-09-20 NOTE — ED Notes (Signed)
Pt has been dressed put and belongings stored at nurses station.

## 2022-09-20 NOTE — ED Triage Notes (Signed)
Patient has been feeling suicidal for 2 days. Stated she would take "a ton of pills." Also has not been able to stop shaking.

## 2022-09-20 NOTE — Consult Note (Cosign Needed Addendum)
BH ED ASSESSMENT   Reason for Consult: Patient here stating she wishes to kill herself secondary to her worsening tardive dyskinesia Referring Physician: Delice Bison, PA-C Patient Identification: Heather Lucas MRN:  [id number] ED Chief Complaint: MDD (major depressive disorder), recurrent severe, without psychosis (HCC)  Diagnosis:  Principal Problem:   MDD (major depressive disorder), recurrent severe, without psychosis (HCC) Active Problems:   Suicidal ideation   Tardive dyskinesia   ED Assessment Time Calculation: Start Time: 1730 Stop Time: 1815 Total Time in Minutes (Assessment Completion): 45   Subjective: Heather Lucas is a 62 y.o. female patient admitted with a past psychiatric history of MDD, GAD, insomnia, panic attacks, and tardive dyskinesia who presented voluntarily to [facility] with complaints of suicidal ideations and worsening depression.   HPI:  Patient assessed face-to-face by this provider and chart reviewed on 09/20/22. On evaluation, Heather Lucas is sitting on hospital gurney, in no acute distress. Her long time friend Heather Lucas is here with her, and patient is allowing Heather Lucas to do most of the talking. Patient is alert and oriented x4, cooperative and pleasant. Speech is clear and coherent, normal rate and volume. Eye contact is good. Mood is anxious and depressed with flat and congruent affect. Patient has constant tremors of her arms and legs throughout assessment, and it also appears her mouth is trembling, and she is producing a lot of saliva, which she holds in her mouth. Thought process is coherent with logical thought content. Patient reports suicidal ideations and states she would take a "ton of pills".  Patient denies HI/AVH.  She denies using any drugs or alcohol, states her appetite and sleep have been poor lately due to her increasing tremors and shaking.  She states that she has a neurological appointment but is not until October 2024.  Patient  states that she is exhausted and tired of living like this with this worsening condition.  She states that she lives alone and is on short-term disability, had to retire from [employer] due to her worsening tardive dyskinesia.  Patient's neighbor Heather Lucas states that she has been suffering from this for about a year she will have periods of where she does well for a while then it seems as if she is "going downhill fast."She states that she has also seen patient suffering from confusion, says that patient will go to work on the weekends thinking it was a weekday and then get to the parking lot and realize she did not have to be at work, little things like that she has started noticing with patient's memory.  Patient was just admitted to [facility] in June 2024, states she feels it did not help her because it did not help with her tardive dyskinesia, states she will be willing to go back and tried again if they could promise they would help with her condition.  Patient denies homicidal ideations. Patient denies a history of suicide attempts or self-harm. Patient denies past psychiatric hospitalizations. Patient denies auditory and visual hallucinations. Patient denies symptoms of paranoia. Patient is able to converse coherently with goal-directed thoughts and no distractibility or preoccupation. Objectively, there is no evidence of psychosis/mania, delusional thinking, or indication that patient is responding to internal or external stimuli.   Patient reports increased sleep and states on the weekends "I move from the bed to the sofa and that's where I stay til I go to work Monday morning." Patient reports fair appetite although states she recently lost 25-30lb  while taking Wellbutrin because it caused her to have an upset stomach. Patient states Wellbutrin has already been discontinued. Patient lives alone in [place] and denies access to weapons/firearms. Patient states  she receives outpatient psychiatric services at [facility] and sees Heather Rack, NP.   Per chart review with office visit to [facility] at [facility] psychiatry on 09/11/22, patient asked to taper off all medications, and was offered to transition to [facility], but patient declined.  Patient medications have been DC'd with taper instructions given, such as Ingrezza, BuSpar, trazodone, clonazepam, Remeron and, Trintellix.  Patient reports in addition to her leg movements, she has drooling and increased anxiety. Patient states she is neglecting her personal hygiene and not doing household chores.   Discussed admission to inpatient placement. Patient is in agreement with plan of care.   Past Psychiatric History: anxiety, depression, tardive dyskinesia  Risk to Self or Others: Risk to Self: Yes Risk to Others:  No  Prior Inpatient Therapy:  Yes  Prior Outpatient Therapy:  No    [place] Scale:  Flowsheet Row ED from 09/20/2022 in [facility] Emergency Department at [facility] ED from 09/18/2022 in [facility] Emergency Department at [facility] Admission (Discharged) from 07/17/2022 in [facility] INPATIENT BEHAVIORAL MEDICINE  C-SSRS RISK CATEGORY Moderate Risk No Risk Low Risk       AIMS: Facial and Oral Movements Muscles of Facial Expression: Mild Lips and Perioral Area: None, normal Jaw: None, normal Tongue: None, normal,Extremity Movements Upper (arms, wrists, hands, fingers): Mild Lower (legs, knees, ankles, toes): Mild,Trunk Movements Neck, shoulders, hips: None, normal, Overall Severity Severity of abnormal movements (highest score from questions above): Mild Incapacitation due to abnormal movements: Mild Patient's awareness of abnormal movements (rate only patient's report): Aware, severe distress,Dental Status Current problems with teeth and/or dentures?: No Does patient usually wear dentures?: No  ASAM:    Substance Abuse:      Past Medical History:  Past Medical History:  Diagnosis Date   Anxiety    Borderline hyperlipidemia    as of 10/2016: TC 134, TG 90, HDL 51, LDL 95 (PCP recently increase rosuvastatin dose from 10 to 20 mg)   Borderline hypertension    Coronary artery disease involving native coronary artery of native heart without angina pectoris 03/01/2020   [facility]:; Dr. Adin Hector:  multivessel CAD => 50% pLAD & 60% mLAD, 70% pLCx & 90% mLCx,  70% mid RCA => CABG x 4   Coronary artery disease with history of myocardial infarction without history of CABG    Depression    Family history of premature coronary artery disease 08/26/2017   Hx of CABG x 4 03/19/2020   [facility], [place], [place] -> CABG x4 (LIMA-LAD, SVG-OM1, SVG-OM2, SVG-dRCA   Hyperlipidemia due to dietary fat intake 08/26/2017    Past Surgical History:  Procedure Laterality Date   CORONARY ARTERY BYPASS GRAFT  03/2020   [facility], [place], [place] -> reportedly CABG x4   GXT/ETT  02/23/2020   From [facility]:: 9:30 min (Stage 3). 10.8 METS 80% MPHR; NO CP but + DOE. -> 1-2 mm Inf De[pression & AVR STE = c/w Ischemia   LAPAROSCOPIC CHOLECYSTECTOMY  1995   LEFT HEART CATH AND CORONARY ANGIOGRAPHY  03/01/2020   [facility]; Dr. Adin Hector:  multivessel CAD => 50% pLAD & 60% mLAD, 70% pLCx & 90%  mLCx,  70% mid RCA => CABG   NM MYOVIEW LTD  02/2018   Clearly appears abnormal, referred for [facility], [place]   TRANSTHORACIC ECHOCARDIOGRAM  02/16/2020   [facility]:  a) Echo 02/16/2020: EF 60% with mild MR and TR.; b) Post Op Limited Echo: EF 55-60%.  Normal wall motion.  Normal RV.  Mild RA dilation.  No pericardial effusion.  Left pleural effusion noted.   TRANSTHORACIC ECHOCARDIOGRAM  07/2021   ([facility])  EF 65 to 70%.  No RWMA other than possible septal motion.  Mild MAC.  Aortic sclerosis.  Otherwise normal.   Family History:  Family History  Problem Relation Age of Onset   Heart attack Mother 69       Recently died; age 28.   Cancer Father    Heart attack Brother    CAD Brother    Heart attack Maternal Grandmother    Diabetes Maternal Grandmother    Colon cancer Paternal Grandmother    Other Paternal Grandfather        Circulatory problems    Social History:  Social History   Substance and Sexual Activity  Alcohol Use Not Currently     Social History   Substance and Sexual Activity  Drug Use Never    Social History   Socioeconomic History   Marital status: Divorced    Spouse name: Not on file   Number of children: Not on file   Years of education: 12   Highest education level: High school graduate  Occupational History    Employer: [employer]    Comment: Works doing Paramedic  Tobacco Use   Smoking status: Never   Smokeless tobacco: Never  Substance and Sexual Activity   Alcohol use: Not Currently   Drug use: Never   Sexual activity: Not Currently  Other Topics Concern   Not on file  Social History Narrative   She is single.  Lives alone.    Does not routinely exercise      About 3 years ago, she moved down to [place], [place] (in the [place]) delivered with her brother after being born and raised in [place].   Social Determinants of Health   Financial Resource Strain: Not on file  Food Insecurity: No Food Insecurity (07/17/2022)   Hunger Vital Sign    Worried About Running Out of Food in the Last Year: Never true    Ran Out of Food in the Last Year: Never true  Transportation Needs: No Transportation Needs (07/17/2022)   PRAPARE - Administrator, Civil Service (Medical): No    Lack of Transportation (Non-Medical): No  Physical Activity: High Risk (04/15/2019)   Received from [facility]   Physical Activity    How  often do you engage in moderate physical activity for 30 minutes or more?: Never    How often do you engage in vigorous physical activity for 20 minutes or more?: Never    How many hours per day do you spend sitting?: More than 8 hours  Stress: Low Risk  (03/06/2021)   Received from [facility]   Stress    Stress in your Life: Not on file    Dealing with Stress: Not on file  Social Connections: Not on file     Allergies:  No Known Allergies  Labs:  Results for orders placed or performed during the hospital encounter of 09/20/22 (from the past 48 hour(s))  Comprehensive  metabolic panel     Status: None   Collection Time: 09/20/22  3:45 PM  Result Value Ref Range   Sodium 141 135 - 145 mmol/L   Potassium 3.5 3.5 - 5.1 mmol/L   Chloride 105 98 - 111 mmol/L   CO2 22 22 - 32 mmol/L   Glucose, Bld 99 70 - 99 mg/dL    Comment: Glucose reference range applies only to samples taken after fasting for at least 8 hours.   BUN 12 8 - 23 mg/dL   Creatinine, Ser 7.25 0.44 - 1.00 mg/dL   Calcium 9.4 8.9 - 36.6 mg/dL   Total Protein 7.7 6.5 - 8.1 g/dL   Albumin 4.6 3.5 - 5.0 g/dL   AST 30 15 - 41 U/L   ALT 21 0 - 44 U/L   Alkaline Phosphatase 69 38 - 126 U/L   Total Bilirubin 0.7 0.3 - 1.2 mg/dL   GFR, Estimated >44 >03 mL/min    Comment: (NOTE) Calculated using the CKD-EPI Creatinine Equation (2021)    Anion gap 14 5 - 15    Comment: Performed at [facility], [address], [place], [place] [address]  Ethanol     Status: None   Collection Time: 09/20/22  3:45 PM  Result Value Ref Range   Alcohol, Ethyl (B) <10 <10 mg/dL    Comment: (NOTE) Lowest detectable limit for serum alcohol is 10 mg/dL.  For medical purposes only. Performed at [facility], [address] [place], [place] [address]   Salicylate level     Status: Abnormal   Collection Time: 09/20/22  3:45 PM  Result Value Ref Range   Salicylate Lvl <7.0 (L) 7.0 - 30.0 mg/dL     Comment: Performed at [facility], [address], [place], [place] [address]  Acetaminophen level     Status: Abnormal   Collection Time: 09/20/22  3:45 PM  Result Value Ref Range   Acetaminophen (Tylenol), Serum <10 (L) 10 - 30 ug/mL    Comment: (NOTE) Therapeutic concentrations vary significantly. A range of 10-30 ug/mL  may be an effective concentration for many patients. However, some  are best treated at concentrations outside of this range. Acetaminophen concentrations >150 ug/mL at 4 hours after ingestion  and >50 ug/mL at 12 hours after ingestion are often associated with  toxic reactions.  Performed at [facility], [address], [place], [place] [address]   cbc     Status: Abnormal   Collection Time: 09/20/22  3:45 PM  Result Value Ref Range   WBC 11.2 (H) 4.0 - 10.5 K/uL   RBC 4.41 3.87 - 5.11 MIL/uL   Hemoglobin 13.6 12.0 - 15.0 g/dL   HCT 88.4 16.6 - 06.3 %   MCV 92.5 80.0 - 100.0 fL   MCH 30.8 26.0 - 34.0 pg   MCHC 33.3 30.0 - 36.0 g/dL   RDW 01.6 01.0 - 93.2 %   Platelets 209 150 - 400 K/uL   nRBC 0.0 0.0 - 0.2 %    Comment: Performed at [facility], [address], [place], [place] [address]    Current Facility-Administered Medications  Medication Dose Route Frequency Provider Last Rate Last Admin   lactated ringers bolus 1,000 mL  1,000 mL Intravenous Once Al Decant, PA-C       Current Outpatient Medications  Medication Sig Dispense Refill   clonazePAM (KLONOPIN) 1 MG tablet TAKE ONE TABLET BY MOUTH TWICE  DAILY 60 tablet 0   clopidogrel (PLAVIX) 75 MG tablet Take 1 tablet (75 mg total) by mouth daily. 30 tablet 0   ezetimibe (ZETIA) 10 MG tablet Take 1 tablet (10 mg total) by mouth daily. 30 tablet 0   LORazepam (ATIVAN) 0.5 MG tablet Take 1 tablet (0.5 mg total) by mouth at bedtime. 5 tablet 0   midodrine (PROAMATINE) 5 MG tablet Take 1 tablet (5 mg total) by mouth 2 (two) times daily  with a meal. 60 tablet 0   mirtazapine (REMERON) 7.5 MG tablet Take 1 tablet (7.5 mg total) by mouth at bedtime. 30 tablet 2   potassium chloride (KLOR-CON) 10 MEQ tablet Take 1 tablet (10 mEq total) by mouth 2 (two) times daily. 6 tablet 0   rosuvastatin (CRESTOR) 20 MG tablet Take 1 tablet (20 mg total) by mouth daily. 30 tablet 0   traZODone (DESYREL) 50 MG tablet Take 1 tablet (50 mg total) by mouth at bedtime as needed for sleep. 30 tablet 0   TRINTELLIX 5 MG TABS tablet TAKE 1 TABLET ONCE A DAY. 30 tablet 0    Musculoskeletal: Strength & Muscle Tone: within normal limits Gait & Station: normal Patient leans: N/A   Psychiatric Specialty Exam: Presentation  General Appearance:  Appropriate for Environment  Eye Contact: Good  Speech: Slurred  Speech Volume: Normal  Handedness: Right   Mood and Affect  Mood: Hopeless; Depressed; Anxious  Affect: Depressed; Flat   Thought Process  Thought Processes: Coherent  Descriptions of Associations:Intact  Orientation:Full (Time, Place and Person)  Thought Content:WDL  History of Schizophrenia/Schizoaffective disorder:No  Duration of Psychotic Symptoms:No data recorded Hallucinations:Hallucinations: None  Ideas of Reference:None  Suicidal Thoughts:Suicidal Thoughts: Yes, Active SI Active Intent and/or Plan: With Plan  Homicidal Thoughts:Homicidal Thoughts: No   Sensorium  Memory: Immediate Fair; Recent Fair; Remote Fair  Judgment: Fair  Insight: Good   Executive Functions  Concentration: Fair  Attention Span: Good  Recall: Fair  Fund of Knowledge: Fair  Language: Good   Psychomotor Activity  Psychomotor Activity: Psychomotor Activity: Extrapyramidal Side Effects (EPS); Increased; Tremor Extrapyramidal Side Effects (EPS): Tardive Dyskinesia AIMS Completed?: Yes   Assets  Assets: Communication Skills; Desire for Improvement; Social Support; Housing    Sleep  Sleep: Sleep:  Poor   Physical Exam: Physical Exam Vitals and nursing note reviewed. Exam conducted with a chaperone present.  Neurological:     Mental Status: She is alert.  Psychiatric:        Attention and Perception: Attention normal.        Mood and Affect: Mood is anxious and depressed.        Speech: Speech is slurred.        Behavior: Behavior is cooperative.        Thought Content: Thought content includes suicidal ideation. Thought content includes suicidal plan.        Cognition and Memory: Memory normal.        Judgment: Judgment normal.    Review of Systems  Constitutional: Negative.   Psychiatric/Behavioral:  Positive for depression and suicidal ideas. The patient is nervous/anxious.    Blood pressure 129/77, pulse 92, temperature 97.8 F (36.6 C), temperature source Oral, resp. rate 18, height 5\' 1"  (1.549 m), weight 52 kg, SpO2 94%. Body mass index is 21.66 kg/m.   Medical Decision Making: Patient case review and discussed with Dr. Clovis Riley. Patient needs inpatient psychiatric admission for stabilization and treatment.  Will restart patient's Remeron for depression, and Ativan  for tremors and anxiety.   Disposition: Recommend psychiatric Inpatient admission.  Alona Bene, PMHNP 09/20/2022 6:57 PM

## 2022-09-20 NOTE — ED Provider Notes (Signed)
[place] EMERGENCY DEPARTMENT AT [facility] Provider Note   CSN: [id number] Arrival date & time: 09/20/22  1325     Hist Chief Complaint  Patient presents with   Suicidal    Heather Lucas is a 62 y.o. female with medical history of depression, CAD, history of CABG x 4 on anticoagulation, anxiety, hypertension, tardive dyskinesia.  Patient presents to ED for evaluation of worsening symptoms of tardive dyskinesia and suicidal ideation.  The patient is here with her friend who provides some history.  Per patient friend, the patient has had worsening symptoms of tardive dyskinesia over the last "months".  The patient reports that she has recently been tapering herself off of medications for tardive dyskinesia as they are not helping with her symptoms.  Patient reports that she is currently being seen by Dr.  Kallie Locks for her tardive dyskinesia.  The patient was seen 2 days ago for similar symptoms and discharged with benzodiazepines and advised to follow-up with her PCP.  Patient reports she is upcoming appointment on Tuesday to discuss symptoms and further medications.  Patient states that she is also suicidal.  The patient reports that she has had suicidal ideation for the last 2 days secondary to her worsening tardive dyskinesia symptoms.  She reports that she "cannot live like this anymore".  She is seeking admission for her symptoms.  She reports that her plan to kill herself would be to "take much of pills".  She denies a history of suicide attempts, hospitalizations for suicidal ideation.  She denies access to firearms.  She denies HI, AVH.  HPI     Home Medications Prior to Admission medications   Medication Sig Start Date End Date Taking? Authorizing Provider  clonazePAM (KLONOPIN) 1 MG tablet TAKE ONE TABLET BY MOUTH TWICE DAILY 07/21/22   Clapacs, Jackquline Denmark, MD  clopidogrel (PLAVIX) 75 MG tablet Take 1 tablet (75 mg total) by mouth daily. 07/22/22   Clapacs, Jackquline Denmark, MD   ezetimibe (ZETIA) 10 MG tablet Take 1 tablet (10 mg total) by mouth daily. 07/21/22   Clapacs, Jackquline Denmark, MD  LORazepam (ATIVAN) 0.5 MG tablet Take 1 tablet (0.5 mg total) by mouth at bedtime. 09/18/22   Lorre Nick, MD  midodrine (PROAMATINE) 5 MG tablet Take 1 tablet (5 mg total) by mouth 2 (two) times daily with a meal. 07/21/22   Clapacs, Jackquline Denmark, MD  mirtazapine (REMERON) 7.5 MG tablet Take 1 tablet (7.5 mg total) by mouth at bedtime. 07/25/22   Mozingo, Thereasa Solo, NP  potassium chloride (KLOR-CON) 10 MEQ tablet Take 1 tablet (10 mEq total) by mouth 2 (two) times daily. 09/18/22   Lorre Nick, MD  rosuvastatin (CRESTOR) 20 MG tablet Take 1 tablet (20 mg total) by mouth daily. 07/21/22   Clapacs, Jackquline Denmark, MD  traZODone (DESYREL) 50 MG tablet Take 1 tablet (50 mg total) by mouth at bedtime as needed for sleep. 07/21/22   Clapacs, Jackquline Denmark, MD  TRINTELLIX 5 MG TABS tablet TAKE 1 TABLET ONCE A DAY. 09/11/22   Mozingo, Thereasa Solo, NP      Allergies    Patient has no known allergies.    Review of Systems   Review of Systems  Neurological:  Positive for tremors.  Psychiatric/Behavioral:  Positive for suicidal ideas.   All other systems reviewed and are negative.   Physical Exam Updated Vital Signs BP (!) 154/84 (BP Location: Right Arm)   Pulse 89   Temp 98.9 F (37.2 C) (Oral)  Resp 18   Ht 5\' 1"  (1.549 m)   Wt 52 kg   SpO2 97%   BMI 21.66 kg/m  Physical Exam Vitals and nursing note reviewed.  Constitutional:      General: She is not in acute distress.    Appearance: Normal appearance. She is not ill-appearing, toxic-appearing or diaphoretic.  HENT:     Head: Normocephalic and atraumatic.     Nose: Nose normal.     Mouth/Throat:     Mouth: Mucous membranes are moist.     Pharynx: Oropharynx is clear.  Eyes:     Extraocular Movements: Extraocular movements intact.     Conjunctiva/sclera: Conjunctivae normal.     Pupils: Pupils are equal, round, and reactive to light.   Cardiovascular:     Rate and Rhythm: Normal rate and regular rhythm.  Pulmonary:     Effort: Pulmonary effort is normal.     Breath sounds: Normal breath sounds. No wheezing.  Abdominal:     General: Abdomen is flat. Bowel sounds are normal.     Palpations: Abdomen is soft.     Tenderness: There is no abdominal tenderness.  Musculoskeletal:     Cervical back: Normal range of motion and neck supple. No tenderness.  Skin:    General: Skin is warm and dry.     Capillary Refill: Capillary refill takes less than 2 seconds.  Neurological:     Mental Status: She is alert and oriented to person, place, and time.     Comments: Tremors noted     ED Results / Procedures / Treatments   Labs (all labs ordered are listed, but only abnormal results are displayed) Labs Reviewed  SALICYLATE LEVEL - Abnormal; Notable for the following components:      Result Value   Salicylate Lvl <7.0 (*)    All other components within normal limits  ACETAMINOPHEN LEVEL - Abnormal; Notable for the following components:   Acetaminophen (Tylenol), Serum <10 (*)    All other components within normal limits  CBC - Abnormal; Notable for the following components:   WBC 11.2 (*)    All other components within normal limits  COMPREHENSIVE METABOLIC PANEL  ETHANOL  RAPID URINE DRUG SCREEN, HOSP PERFORMED    EKG None  Radiology No results found.  Procedures Procedures   Medications Ordered in ED Medications  LORazepam (ATIVAN) tablet 0.5 mg (0.5 mg Oral Given 09/20/22 2119)  mirtazapine (REMERON) tablet 7.5 mg (7.5 mg Oral Given 09/20/22 2118)  traZODone (DESYREL) tablet 50 mg (50 mg Oral Given 09/20/22 2119)  rosuvastatin (CRESTOR) tablet 20 mg (20 mg Oral Given 09/20/22 2119)  midodrine (PROAMATINE) tablet 5 mg (has no administration in time range)  ezetimibe (ZETIA) tablet 10 mg (has no administration in time range)  clopidogrel (PLAVIX) tablet 75 mg (has no administration in time range)  lactated  ringers bolus 1,000 mL (1,000 mLs Intravenous New Bag/Given 09/20/22 1905)  LORazepam (ATIVAN) tablet 1 mg (1 mg Oral Given 09/20/22 1636)  diphenhydrAMINE (BENADRYL) injection 50 mg (50 mg Intramuscular Given 09/20/22 1815)    ED Course/ Medical Decision Making/ A&P Clinical Course as of 09/20/22 2214  Sat Sep 20, 2022  1539 Acute suicidal ideation.  Setting of tardive dyskinesia.  Psychiatric referral for long-term care management. [CC]    Clinical Course User Index [CC] Glyn Ade, MD   Medical Decision Making Amount and/or Complexity of Data Reviewed Labs: ordered.  Risk Prescription drug management.   62 year old female presents to ED for  evaluation.  Please see HPI for further details.  On examination patient does have severe tremors noted throughout upper extremities.  She is afebrile and nontachycardic.  Her lung sounds are clear bilaterally.  Abdomen soft and compressible throughout.  Neurological examination at baseline.  CBC shows leukocytosis 11.2, hemoglobin baseline.  CMP without electrolyte derangement.  Ethanol undetectable.  Salicylate, acetaminophen undetectable.  Patient here voluntarily.  Will not proceed with IVC as she states that she is wishing to be evaluated by psychiatry.  Patient medically cleared pending TTS position.  TTS has recommended inpatient psychiatric hospitalization for stabilization.  Patient medications have and ordered.   Final Clinical Impression(s) / ED Diagnoses Final diagnoses:  Suicidal ideations  Tardive dyskinesia    Rx / DC Orders ED Discharge Orders     None         Clent Ridges 09/20/22 2214    Glyn Ade, MD 09/21/22 1512

## 2022-09-20 NOTE — ED Provider Triage Note (Signed)
Emergency Medicine Provider Triage Evaluation Note  Heather Lucas , a 62 y.o. female  was evaluated in triage.  Pt complains of tardive dyskinesia affecting her mouth, legs, and arms for several months.  Review of Systems  Positive: Tardive dyskinesia affecting her full body Negative:   Physical Exam  BP 129/77 (BP Location: Right Arm)   Pulse 92   Temp 97.8 F (36.6 C) (Oral)   Resp 18   Ht 5\' 1"  (1.549 m)   Wt 52.2 kg   SpO2 94%   BMI 21.73 kg/m  Gen:   Awake, no distress   Resp:  Normal effort  MSK:   Moves extremities without difficulty  Other:    Medical Decision Making  Medically screening exam initiated at 2:22 PM.  Appropriate orders placed.  Heather Lucas was informed that the remainder of the evaluation will be completed by another provider, this initial triage assessment does not replace that evaluation, and the importance of remaining in the ED until their evaluation is complete.   Maxwell Marion, PA-C 09/20/22 1425

## 2022-09-21 ENCOUNTER — Encounter: Payer: Self-pay | Admitting: Psychiatry

## 2022-09-21 ENCOUNTER — Inpatient Hospital Stay
Admission: EM | Admit: 2022-09-21 | Discharge: 2022-09-26 | DRG: 881 | Disposition: A | Discharge status: Home / Self Care | Payer: BC Managed Care – PPO | Source: Intra-hospital | Attending: Psychiatry | Admitting: Psychiatry

## 2022-09-21 ENCOUNTER — Telehealth: Payer: Self-pay

## 2022-09-21 DIAGNOSIS — E785 Hyperlipidemia, unspecified: Secondary | ICD-10-CM | POA: Diagnosis present

## 2022-09-21 DIAGNOSIS — Z809 Family history of malignant neoplasm, unspecified: Secondary | ICD-10-CM

## 2022-09-21 DIAGNOSIS — F329 Major depressive disorder, single episode, unspecified: Principal | ICD-10-CM | POA: Diagnosis present

## 2022-09-21 DIAGNOSIS — Z800 Family history of malignant neoplasm of digestive organs: Secondary | ICD-10-CM | POA: Diagnosis not present

## 2022-09-21 DIAGNOSIS — R630 Anorexia: Secondary | ICD-10-CM | POA: Diagnosis present

## 2022-09-21 DIAGNOSIS — G2401 Drug induced subacute dyskinesia: Secondary | ICD-10-CM | POA: Diagnosis not present

## 2022-09-21 DIAGNOSIS — Z951 Presence of aortocoronary bypass graft: Secondary | ICD-10-CM | POA: Diagnosis not present

## 2022-09-21 DIAGNOSIS — Z7902 Long term (current) use of antithrombotics/antiplatelets: Secondary | ICD-10-CM | POA: Diagnosis not present

## 2022-09-21 DIAGNOSIS — I10 Essential (primary) hypertension: Secondary | ICD-10-CM | POA: Diagnosis not present

## 2022-09-21 DIAGNOSIS — I2510 Atherosclerotic heart disease of native coronary artery without angina pectoris: Secondary | ICD-10-CM | POA: Diagnosis present

## 2022-09-21 DIAGNOSIS — R45851 Suicidal ideations: Secondary | ICD-10-CM | POA: Diagnosis present

## 2022-09-21 DIAGNOSIS — Z7901 Long term (current) use of anticoagulants: Secondary | ICD-10-CM | POA: Diagnosis not present

## 2022-09-21 DIAGNOSIS — Z8249 Family history of ischemic heart disease and other diseases of the circulatory system: Secondary | ICD-10-CM

## 2022-09-21 DIAGNOSIS — F331 Major depressive disorder, recurrent, moderate: Secondary | ICD-10-CM

## 2022-09-21 DIAGNOSIS — Z833 Family history of diabetes mellitus: Secondary | ICD-10-CM

## 2022-09-21 DIAGNOSIS — F410 Panic disorder [episodic paroxysmal anxiety] without agoraphobia: Secondary | ICD-10-CM | POA: Diagnosis not present

## 2022-09-21 DIAGNOSIS — G4700 Insomnia, unspecified: Secondary | ICD-10-CM | POA: Diagnosis present

## 2022-09-21 DIAGNOSIS — Z79899 Other long term (current) drug therapy: Secondary | ICD-10-CM

## 2022-09-21 DIAGNOSIS — I252 Old myocardial infarction: Secondary | ICD-10-CM | POA: Diagnosis not present

## 2022-09-21 DIAGNOSIS — Z681 Body mass index (BMI) 19 or less, adult: Secondary | ICD-10-CM

## 2022-09-21 DIAGNOSIS — F332 Major depressive disorder, recurrent severe without psychotic features: Secondary | ICD-10-CM | POA: Diagnosis not present

## 2022-09-21 DIAGNOSIS — Z955 Presence of coronary angioplasty implant and graft: Secondary | ICD-10-CM | POA: Diagnosis not present

## 2022-09-21 DIAGNOSIS — R251 Tremor, unspecified: Secondary | ICD-10-CM | POA: Diagnosis not present

## 2022-09-21 LAB — URINALYSIS, COMPLETE (UACMP) WITH MICROSCOPIC
Bacteria, UA: NONE SEEN
Bilirubin Urine: NEGATIVE
Glucose, UA: NEGATIVE mg/dL
Hgb urine dipstick: NEGATIVE
Ketones, ur: 20 mg/dL — AB
Leukocytes,Ua: NEGATIVE
Nitrite: NEGATIVE
Protein, ur: NEGATIVE mg/dL
Specific Gravity, Urine: 1.011 (ref 1.005–1.030)
pH: 5 (ref 5.0–8.0)

## 2022-09-21 LAB — RAPID URINE DRUG SCREEN, HOSP PERFORMED
Amphetamines: NOT DETECTED
Barbiturates: NOT DETECTED
Benzodiazepines: POSITIVE — AB
Cocaine: NOT DETECTED
Opiates: NOT DETECTED
Tetrahydrocannabinol: NOT DETECTED

## 2022-09-21 MED ORDER — DIPHENHYDRAMINE HCL 50 MG/ML IJ SOLN: 50.0000 mg | Freq: Three times a day (TID) | INTRAMUSCULAR | Status: DC | PRN

## 2022-09-21 MED ORDER — CLOPIDOGREL BISULFATE 75 MG PO TABS
75.0000 mg | ORAL_TABLET | Freq: Every day | ORAL | Status: DC
  Administered 2022-09-22 – 2022-09-26 (×5): 75 mg via ORAL
  Filled 2022-09-21 (×5): qty 1

## 2022-09-21 MED ORDER — LORAZEPAM 0.5 MG PO TABS
0.5000 mg | ORAL_TABLET | Freq: Every day | ORAL | Status: DC
  Administered 2022-09-21: 0.5 mg via ORAL
  Filled 2022-09-21: qty 1

## 2022-09-21 MED ORDER — ALUM & MAG HYDROXIDE-SIMETH 200-200-20 MG/5ML PO SUSP: 30.0000 mL | ORAL | Status: DC | PRN

## 2022-09-21 MED ORDER — TRAZODONE HCL 50 MG PO TABS
50.0000 mg | ORAL_TABLET | Freq: Every day | ORAL | Status: DC
  Filled 2022-09-21: qty 1

## 2022-09-21 MED ORDER — HALOPERIDOL 5 MG PO TABS: 5.0000 mg | ORAL_TABLET | Freq: Three times a day (TID) | ORAL | Status: DC | PRN

## 2022-09-21 MED ORDER — MIRTAZAPINE 15 MG PO TABS
7.5000 mg | ORAL_TABLET | Freq: Every day | ORAL | Status: DC
  Administered 2022-09-21 – 2022-09-25 (×6): 7.5 mg via ORAL
  Filled 2022-09-21 (×5): qty 1

## 2022-09-21 MED ORDER — DIPHENHYDRAMINE HCL 25 MG PO CAPS: 50.0000 mg | ORAL_CAPSULE | Freq: Three times a day (TID) | ORAL | Status: DC | PRN

## 2022-09-21 MED ORDER — HYDROXYZINE HCL 25 MG PO TABS
25.0000 mg | ORAL_TABLET | Freq: Three times a day (TID) | ORAL | Status: DC | PRN
  Administered 2022-09-22 – 2022-09-23 (×2): 25 mg via ORAL
  Filled 2022-09-21 (×2): qty 1

## 2022-09-21 MED ORDER — MIDODRINE HCL 5 MG PO TABS
5.0000 mg | ORAL_TABLET | Freq: Every day | ORAL | Status: DC
  Administered 2022-09-22 – 2022-09-26 (×5): 5 mg via ORAL
  Filled 2022-09-21 (×5): qty 1

## 2022-09-21 MED ORDER — LORAZEPAM 1 MG PO TABS: 2.0000 mg | ORAL_TABLET | Freq: Three times a day (TID) | ORAL | Status: DC | PRN

## 2022-09-21 MED ORDER — MIDODRINE HCL 5 MG PO TABS
2.5000 mg | ORAL_TABLET | Freq: Every day | ORAL | Status: DC
  Administered 2022-09-21 – 2022-09-25 (×5): 2.5 mg via ORAL
  Filled 2022-09-21 (×6): qty 0.5

## 2022-09-21 MED ORDER — HALOPERIDOL LACTATE 5 MG/ML IJ SOLN: 5.0000 mg | Freq: Three times a day (TID) | INTRAMUSCULAR | Status: DC | PRN

## 2022-09-21 MED ORDER — EZETIMIBE 10 MG PO TABS
10.0000 mg | ORAL_TABLET | Freq: Every day | ORAL | Status: DC
  Administered 2022-09-22 – 2022-09-26 (×5): 10 mg via ORAL
  Filled 2022-09-21 (×5): qty 1

## 2022-09-21 MED ORDER — TRAZODONE HCL 50 MG PO TABS
50.0000 mg | ORAL_TABLET | Freq: Every evening | ORAL | Status: DC | PRN
  Administered 2022-09-21: 50 mg via ORAL

## 2022-09-21 MED ORDER — MAGNESIUM HYDROXIDE 400 MG/5ML PO SUSP
30.0000 mL | Freq: Every day | ORAL | Status: DC | PRN
  Administered 2022-09-22 – 2022-09-24 (×2): 30 mL via ORAL
  Filled 2022-09-21 (×2): qty 30

## 2022-09-21 MED ORDER — LORAZEPAM 2 MG/ML IJ SOLN: 2.0000 mg | Freq: Three times a day (TID) | INTRAMUSCULAR | Status: DC | PRN

## 2022-09-21 MED ORDER — ACETAMINOPHEN 325 MG PO TABS: 650.0000 mg | ORAL_TABLET | Freq: Four times a day (QID) | ORAL | Status: DC | PRN

## 2022-09-21 MED ORDER — ROSUVASTATIN CALCIUM 5 MG PO TABS
20.0000 mg | ORAL_TABLET | Freq: Every day | ORAL | Status: DC
  Administered 2022-09-22 – 2022-09-26 (×5): 20 mg via ORAL
  Filled 2022-09-21 (×5): qty 4

## 2022-09-21 NOTE — Telephone Encounter (Signed)
Working on paper work from [employer] but unable to complete and submit, until pt discharged from current admission for suicidal ideations at [facility], in case any changes need to be added.

## 2022-09-21 NOTE — ED Notes (Signed)
Patient resting in bed, constantly hits call light stating that she needs meds for shakes. Advised once again on med schedule.

## 2022-09-21 NOTE — ED Provider Notes (Signed)
Emergency Medicine Observation Re-evaluation Note  Heather Lucas is a 62 y.o. female, seen on rounds today.  Pt initially presented to the ED for complaints of Suicidal Currently, the patient is stable in bed, no co, being admitted to [facility] for major dd.  Physical Exam  BP (!) 131/55 (BP Location: Right Arm)   Pulse 81   Temp 98.5 F (36.9 C) (Oral)   Resp 18   Ht 1.549 m (5\' 1" )   Wt 52 kg   SpO2 98%   BMI 21.66 kg/m  Physical Exam General: laying in bed nad Cardiac: rrr, normotensive Lungs: normal rr, normal sats Psych: flat, slowed  ED Course / MDM  EKG:   I have reviewed the labs performed to date as well as medications administered while in observation.  Recent changes in the last 24 hours include plan for admission, medically cleared.  Plan  Current plan is for transfer to [place] for [facility] admission.    Margarita Grizzle, MD 09/21/22 1054

## 2022-09-21 NOTE — Progress Notes (Addendum)
Pt was accepted to [facility] TODAY8/12/2022; Bed Assignment L31 PENDING signed voluntary consent faxed to [phone]. Address: [address] [place], [place], [place] [address]  DX:MDD  Pt meets inpatient criteria per Alona Bene, PMHNP  Attending Physician will be Dr. Shellee Milo  Report can be called to: - [phone]  Pt can arrive after 2000; [facility] and Charge [facility] Rolena Infante RN will coordinate with care team.  Care Team notified:Day [facility] Delena Bali, Percell Arushi Partridge, RN, Charleston Ropes, Mennaskshi Parmar,MD, [facility], PMHNP    [place], LCSWA 09/21/2022 @ 10:58 AM

## 2022-09-21 NOTE — Group Note (Signed)
Date:  09/21/2022 Time:  9:14 PM  Group Topic/Focus:  Building Self Esteem:   The Focus of this group is helping patients become aware of the effects of self-esteem on their lives, the things they and others do that enhance or undermine their self-esteem, seeing the relationship between their level of self-esteem and the choices they make and learning ways to enhance self-esteem. Goals Group:   The focus of this group is to help patients establish daily goals to achieve during treatment and discuss how the patient can incorporate goal setting into their daily lives to aide in recovery. Healthy Communication:   The focus of this group is to discuss communication, barriers to communication, as well as healthy ways to communicate with others. Making Healthy Choices:   The focus of this group is to help patients identify negative/unhealthy choices they were using prior to admission and identify positive/healthier coping strategies to replace them upon discharge. Self Care:   The focus of this group is to help patients understand the importance of self-care in order to improve or restore emotional, physical, spiritual, interpersonal, and financial health.    Participation Level:  Did Not Attend  Participation Quality:   did not attend  Affect:   did not attend  Cognitive:   did not attend  Insight: None  Engagement in Group:   did not attend  Modes of Intervention:   did not attend  Additional Comments:    Heather Lucas 09/21/2022, 9:14 PM

## 2022-09-21 NOTE — Tx Team (Signed)
Initial Treatment Plan 09/21/2022 3:44 PM Heather Lucas [id number]    PATIENT STRESSORS: Health problems     PATIENT STRENGTHS: Ability for insight  Active sense of humor  Communication skills    PATIENT IDENTIFIED PROBLEMS:                      DISCHARGE CRITERIA:  Adequate post-discharge living arrangements Improved stabilization in mood, thinking, and/or behavior Safe-care adequate arrangements made  PRELIMINARY DISCHARGE PLAN: Attend aftercare/continuing care group Return to previous living arrangement  PATIENT/FAMILY INVOLVEMENT: This treatment plan has been presented to and reviewed with the patient, Heather Lucas,  .  The patient and family have been given the opportunity to ask questions and make suggestions.  Bonnita Hollow, RN 09/21/2022, 3:44 PM

## 2022-09-21 NOTE — Progress Notes (Signed)
This 62 y/o female was admitted today from [facility] . Pt had gone there with suicidal thoughts which are caused by he shaking and tardive dyskinesia. Pt is able to ambulate well, and with out difficulty. When pt first arrive she was demanding "something for my shakiness" every few minutes. Staff explained to pt many times that it was not time for medication yet, and that she just took something before she left [employer]. Pt finally understood and stopped asking. Pt said that she could be safe while here and agreed not to harm herself or anyone else while here. Pt denies any AVH. Pt given tour of unit and changed into scrubs. Skin check was unremarkable.

## 2022-09-22 DIAGNOSIS — F332 Major depressive disorder, recurrent severe without psychotic features: Secondary | ICD-10-CM

## 2022-09-22 MED ORDER — AMANTADINE HCL 100 MG PO CAPS
100.0000 mg | ORAL_CAPSULE | Freq: Every day | ORAL | Status: DC
  Administered 2022-09-22: 100 mg via ORAL
  Filled 2022-09-22 (×3): qty 1

## 2022-09-22 MED ORDER — CLONAZEPAM 1 MG PO TABS
1.0000 mg | ORAL_TABLET | Freq: Two times a day (BID) | ORAL | Status: DC
  Administered 2022-09-22 – 2022-09-26 (×8): 1 mg via ORAL
  Filled 2022-09-22 (×8): qty 1

## 2022-09-22 MED ORDER — MUPIROCIN CALCIUM 2 % EX CREA
TOPICAL_CREAM | Freq: Two times a day (BID) | CUTANEOUS | Status: DC
  Administered 2022-09-22: 1 via TOPICAL
  Administered 2022-09-22: 2 via TOPICAL
  Filled 2022-09-22: qty 15

## 2022-09-22 MED ORDER — TRAZODONE HCL 50 MG PO TABS
50.0000 mg | ORAL_TABLET | Freq: Every evening | ORAL | Status: DC | PRN
  Administered 2022-09-22 – 2022-09-24 (×3): 50 mg via ORAL
  Filled 2022-09-22 (×3): qty 1

## 2022-09-22 NOTE — Plan of Care (Signed)
  Problem: Education: Goal: Knowledge of General Education information will improve Description: Including pain rating scale, medication(s)/side effects and non-pharmacologic comfort measures Outcome: Progressing   Problem: Safety: Goal: Ability to remain free from injury will improve Outcome: Progressing   Problem: Skin Integrity: Goal: Risk for impaired skin integrity will decrease Outcome: Progressing   Problem: Pain Managment: Goal: General experience of comfort will improve Outcome: Not Progressing   

## 2022-09-22 NOTE — [facility] Suicide Risk Assessment (Signed)
[facility] Admission Suicide Risk Assessment   Nursing information obtained from:  Patient Demographic factors:  Caucasian Current Mental Status:  NA Loss Factors:  NA Historical Factors:  NA Risk Reduction Factors:  Positive social support   Principal Problem: MDD (major depressive disorder) Diagnosis:  Principal Problem:   MDD (major depressive disorder)  Subjective Data: Heather Lucas is a 62 y.o. female patient admitted with a past psychiatric history of MDD, GAD, insomnia, panic attacks, and tardive dyskinesia who presented voluntarily to [facility] with complaints of suicidal ideations and worsening depression.   Continued Clinical Symptoms:    The "Alcohol Use Disorders Identification Test", Guidelines for Use in Primary Care, Second Edition.  World Science writer [facility]). Score between 0-7:  no or low risk or alcohol related problems. Score between 8-15:  moderate risk of alcohol related problems. Score between 16-19:  high risk of alcohol related problems. Score 20 or above:  warrants further diagnostic evaluation for alcohol dependence and treatment.   CLINICAL FACTORS:   Severe Anxiety and/or Agitation Depression:   Anhedonia Hopelessness Insomnia Severe Previous Psychiatric Diagnoses and Treatments   Musculoskeletal: Strength & Muscle Tone: within normal limits Gait & Station: normal Patient leans: N/A  Psychiatric Specialty Exam:  Presentation  General Appearance:  Appropriate for Environment  Eye Contact: Fair  Speech: Clear and Coherent  Speech Volume: Decreased  Handedness: Right   Mood and Affect  Mood: Anxious; Hopeless; Depressed  Affect: Congruent; Restricted; Other (comment) (Coarse tremors in both upper extremities, facio oral involuntary movements)   Thought Process  Thought Processes: Coherent; Goal Directed  Descriptions of Associations:Intact  Orientation:Full (Time, Place and Person)  Thought Content:Abstract Reasoning;  Logical  History of Schizophrenia/Schizoaffective disorder:No  Duration of Psychotic Symptoms:No data recorded Hallucinations:Hallucinations: None  Ideas of Reference:None  Suicidal Thoughts:Suicidal Thoughts: Yes, Active SI Active Intent and/or Plan: Without Plan  Homicidal Thoughts:Homicidal Thoughts: No   Sensorium  Memory: Immediate Fair; Recent Fair  Judgment: Impaired  Insight: Fair   Chartered certified accountant: Fair  Attention Span: Fair  Recall: [employer] of Knowledge: Fair  Language: Fair   Psychomotor Activity  Psychomotor Activity: Psychomotor Activity: Extrapyramidal Side Effects (EPS); Restlessness Extrapyramidal Side Effects (EPS): Tardive Dyskinesia   Assets  Assets: Desire for Improvement; Housing   Sleep  Sleep: Sleep: Poor    Physical Exam: Physical Exam Constitutional:      Appearance: Normal appearance.  HENT:     Head: Normocephalic and atraumatic.     Nose: Nose normal.  Eyes:     Pupils: Pupils are equal, round, and reactive to light.  Cardiovascular:     Rate and Rhythm: Normal rate.     Pulses: Normal pulses.  Pulmonary:     Effort: Pulmonary effort is normal.  Skin:    General: Skin is warm.  Neurological:     General: No focal deficit present.     Mental Status: She is alert and oriented to person, place, and time.    Review of Systems  Constitutional:  Negative for chills and fever.  HENT:  Negative for congestion and hearing loss.   Eyes:  Negative for blurred vision and double vision.  Respiratory:  Negative for cough and shortness of breath.   Cardiovascular:  Negative for chest pain and palpitations.  Gastrointestinal:  Negative for nausea and vomiting.  Musculoskeletal:  Positive for joint pain.  Neurological:  Positive for tremors. Negative for dizziness and focal weakness.  Psychiatric/Behavioral:  Positive for depression and suicidal ideas. The  patient is nervous/anxious and has  insomnia.    Blood pressure (!) 92/55, pulse 61, temperature (!) 97.2 F (36.2 C), resp. rate 16, height 5\' 2"  (1.575 m), weight 49.2 kg, SpO2 98%. Body mass index is 19.84 kg/m.   COGNITIVE FEATURES THAT CONTRIBUTE TO RISK:  Thought constriction (tunnel vision)    SUICIDE RISK:   Moderate:  Frequent suicidal ideation with limited intensity, and duration,  no associated intent, good self-control, some risk factors present  PLAN OF CARE: Per H&P  I certify that inpatient services furnished can reasonably be expected to improve the patient's condition.   Lewanda Rife, MD

## 2022-09-22 NOTE — BH IP Treatment Plan (Signed)
Interdisciplinary Treatment and Diagnostic Plan Update  09/22/2022 Time of Session: 10:31 AM  Yong Haase MRN: [id number]  Principal Diagnosis: MDD (major depressive disorder)  Secondary Diagnoses: Principal Problem:   MDD (major depressive disorder)   Current Medications:  Current Facility-Administered Medications  Medication Dose Route Frequency Provider Last Rate Last Admin   acetaminophen (TYLENOL) tablet 650 mg  650 mg Oral Q6H PRN Motley-Mangrum, Jadeka A, PMHNP       alum & mag hydroxide-simeth (MAALOX/MYLANTA) 200-200-20 MG/5ML suspension 30 mL  30 mL Oral Q4H PRN Motley-Mangrum, Jadeka A, PMHNP       amantadine (SYMMETREL) capsule 100 mg  100 mg Oral Daily Lewanda Rife, MD       clopidogrel (PLAVIX) tablet 75 mg  75 mg Oral Daily Motley-Mangrum, Jadeka A, PMHNP   75 mg at 09/22/22 0924   diphenhydrAMINE (BENADRYL) capsule 50 mg  50 mg Oral TID PRN Motley-Mangrum, Jadeka A, PMHNP       Or   diphenhydrAMINE (BENADRYL) injection 50 mg  50 mg Intramuscular TID PRN Motley-Mangrum, Jadeka A, PMHNP       ezetimibe (ZETIA) tablet 10 mg  10 mg Oral Daily Motley-Mangrum, Jadeka A, PMHNP   10 mg at 09/22/22 4782   haloperidol (HALDOL) tablet 5 mg  5 mg Oral TID PRN Motley-Mangrum, Jadeka A, PMHNP       Or   haloperidol lactate (HALDOL) injection 5 mg  5 mg Intramuscular TID PRN Motley-Mangrum, Geralynn Ochs A, PMHNP       hydrOXYzine (ATARAX) tablet 25 mg  25 mg Oral TID PRN Motley-Mangrum, Jadeka A, PMHNP       LORazepam (ATIVAN) tablet 2 mg  2 mg Oral TID PRN Motley-Mangrum, Jadeka A, PMHNP       Or   LORazepam (ATIVAN) injection 2 mg  2 mg Intramuscular TID PRN Motley-Mangrum, Jadeka A, PMHNP       LORazepam (ATIVAN) tablet 0.5 mg  0.5 mg Oral QHS Motley-Mangrum, Jadeka A, PMHNP   0.5 mg at 09/21/22 2109   magnesium hydroxide (MILK OF MAGNESIA) suspension 30 mL  30 mL Oral Daily PRN Motley-Mangrum, Jadeka A, PMHNP   30 mL at 09/22/22 0924   midodrine (PROAMATINE) tablet 2.5 mg  2.5  mg Oral QAC supper Drusilla Kanner, RPH   2.5 mg at 09/21/22 1658   midodrine (PROAMATINE) tablet 5 mg  5 mg Oral QAC breakfast Motley-Mangrum, Jadeka A, PMHNP   5 mg at 09/22/22 0739   mirtazapine (REMERON) tablet 7.5 mg  7.5 mg Oral QHS Motley-Mangrum, Jadeka A, PMHNP   7.5 mg at 09/21/22 2109   rosuvastatin (CRESTOR) tablet 20 mg  20 mg Oral Daily Motley-Mangrum, Jadeka A, PMHNP   20 mg at 09/22/22 0924   traZODone (DESYREL) tablet 50 mg  50 mg Oral QHS PRN Lewanda Rife, MD       PTA Medications: Medications Prior to Admission  Medication Sig Dispense Refill Last Dose   amoxicillin-clavulanate (AUGMENTIN) 875-125 MG tablet Take 1 tablet by mouth 2 (two) times daily.      clonazePAM (KLONOPIN) 1 MG tablet TAKE ONE TABLET BY MOUTH TWICE DAILY 60 tablet 0    clopidogrel (PLAVIX) 75 MG tablet Take 1 tablet (75 mg total) by mouth daily. 30 tablet 0    ezetimibe (ZETIA) 10 MG tablet Take 1 tablet (10 mg total) by mouth daily. 30 tablet 0    LORazepam (ATIVAN) 0.5 MG tablet Take 1 tablet (0.5 mg total) by mouth at bedtime. 5 tablet 0  midodrine (PROAMATINE) 5 MG tablet Take 1 tablet (5 mg total) by mouth 2 (two) times daily with a meal. 60 tablet 0    mirtazapine (REMERON) 7.5 MG tablet Take 1 tablet (7.5 mg total) by mouth at bedtime. 30 tablet 2    mupirocin ointment (BACTROBAN) 2 % Apply 1 Application topically 2 (two) times daily.      potassium chloride (KLOR-CON) 10 MEQ tablet Take 1 tablet (10 mEq total) by mouth 2 (two) times daily. 6 tablet 0    rosuvastatin (CRESTOR) 20 MG tablet Take 1 tablet (20 mg total) by mouth daily. 30 tablet 0    traZODone (DESYREL) 50 MG tablet Take 1 tablet (50 mg total) by mouth at bedtime as needed for sleep. 30 tablet 0    TRINTELLIX 5 MG TABS tablet TAKE 1 TABLET ONCE A DAY. 30 tablet 0     Patient Stressors: Health problems    Patient Strengths: Ability for insight  Active sense of humor  Communication skills   Treatment Modalities: Medication  Management, Group therapy, Case management,  1 to 1 session with clinician, Psychoeducation, Recreational therapy.   Physician Treatment Plan for Primary Diagnosis: MDD (major depressive disorder) Long Term Goal(s):     Short Term Goals:    Medication Management: Evaluate patient's response, side effects, and tolerance of medication regimen.  Therapeutic Interventions: 1 to 1 sessions, Unit Group sessions and Medication administration.  Evaluation of Outcomes: Progressing  Physician Treatment Plan for Secondary Diagnosis: Principal Problem:   MDD (major depressive disorder)  Long Term Goal(s):     Short Term Goals:       Medication Management: Evaluate patient's response, side effects, and tolerance of medication regimen.  Therapeutic Interventions: 1 to 1 sessions, Unit Group sessions and Medication administration.  Evaluation of Outcomes: Progressing   RN Treatment Plan for Primary Diagnosis: MDD (major depressive disorder) Long Term Goal(s): Knowledge of disease and therapeutic regimen to maintain health will improve  Short Term Goals: Ability to remain free from injury will improve, Ability to verbalize frustration and anger appropriately will improve, Ability to demonstrate self-control, Ability to participate in decision making will improve, Ability to verbalize feelings will improve, Ability to disclose and discuss suicidal ideas, Ability to identify and develop effective coping behaviors will improve, and Compliance with prescribed medications will improve  Medication Management: RN will administer medications as ordered by provider, will assess and evaluate patient's response and provide education to patient for prescribed medication. RN will report any adverse and/or side effects to prescribing provider.  Therapeutic Interventions: 1 on 1 counseling sessions, Psychoeducation, Medication administration, Evaluate responses to treatment, Monitor vital signs and CBGs as  ordered, Perform/monitor CIWA, COWS, AIMS and Fall Risk screenings as ordered, Perform wound care treatments as ordered.  Evaluation of Outcomes: Progressing   LCSW Treatment Plan for Primary Diagnosis: MDD (major depressive disorder) Long Term Goal(s): Safe transition to appropriate next level of care at discharge, Engage patient in therapeutic group addressing interpersonal concerns.  Short Term Goals: Engage patient in aftercare planning with referrals and resources, Increase social support, Increase ability to appropriately verbalize feelings, Increase emotional regulation, Facilitate acceptance of mental health diagnosis and concerns, and Increase skills for wellness and recovery  Therapeutic Interventions: Assess for all discharge needs, 1 to 1 time with Social worker, Explore available resources and support systems, Assess for adequacy in community support network, Educate family and significant other(s) on suicide prevention, Complete Psychosocial Assessment, Interpersonal group therapy.  Evaluation of Outcomes: Progressing  Progress in Treatment: Attending groups: No. Participating in groups: No. Taking medication as prescribed: Yes. Toleration medication: Yes. Family/Significant other contact made: No, will contact:  CSW will contact if given permission  Patient understands diagnosis: Yes. Discussing patient identified problems/goals with staff: Yes. Medical problems stabilized or resolved: Yes. Denies suicidal/homicidal ideation: Yes. Issues/concerns per patient self-inventory: No. Other: None   New problem(s) identified: No, Describe:  None identified   New Short Term/Long Term Goal(s): , elimination of symptoms of psychosis, medication management for mood stabilization; elimination of SI thoughts; development of comprehensive mental wellness plan.   Patient Goals:  "Stop shaking, to just get better"   Discharge Plan or Barriers: CSW will assist with appropriate  discharge plannign   Reason for Continuation of Hospitalization: Depression Medication stabilization  Estimated Length of Stay: 1 to 7 days   Last 3 [place] Suicide Severity Risk Score: Flowsheet Row Admission (Current) from 09/21/2022 in [facility] ED from 09/20/2022 in [facility] Emergency Department at [facility] ED from 09/18/2022 in [facility] Emergency Department at [facility]  C-SSRS RISK CATEGORY Moderate Risk Moderate Risk No Risk       Last [facility] 2/9 Scores:     No data to display          Scribe for Treatment Team: Elza Rafter, Theresia Majors 09/22/2022 1:49 PM

## 2022-09-22 NOTE — H&P (Signed)
Psychiatric Admission Assessment Adult  Patient Identification: Heather Lucas MRN:  [id number] Date of Evaluation:  09/22/2022 Chief Complaint:  MDD (major depressive disorder) [F32.9] Principal Diagnosis: MDD (major depressive disorder) Diagnosis:  Principal Problem:   MDD (major depressive disorder)  History of Present Illness: Heather Lucas is a 62 y.o. female patient admitted with a past psychiatric history of MDD, GAD, insomnia, panic attacks, and tardive dyskinesia who presented voluntarily to [facility] with complaints of suicidal ideations and worsening depression.  Today patient was seen on the unit for evaluation.  Patient reports that lately she has been feeling depressed, she has anhedonia, she feels helpless and hopeless.  Patient said that she is" tired" of having TDs.  Patient is not able to name all the antipsychotics she has been on in the past but she remembers taking Abilify for few months.  Patient reports that she was given antipsychotics for depression.  Patient denies past history of psychosis or mania.  She reports that she has never been diagnosed with bipolar disorder or schizoaffective disorder.  Patient reports she has an appointment with neurologist in October, but she wants help greatly days now.  Patient reports that she has been on Ingrezza which worsened her tremors.  Patient has suicidal ideation, denies any intention to harm herself on the unit.  Patient was provided with support and reassurance.  She was encouraged to attend groups and work on coping strategies.  Associated Signs/Symptoms: Depression Symptoms:  depressed mood, anhedonia, insomnia, psychomotor retardation, hopelessness, suicidal thoughts without plan, anxiety, disturbed sleep,   Past Psychiatric History: Patient reports history of major depressive disorder, she denies past history of suicide attempt.  She has been admitted to this facility in June 2024.  She has been onIngrezza, BuSpar,  trazodone, clonazepam, Remeron and, Trintellix in the past.   Is the patient at risk to self? Yes.    HIs the patient a risk to others? No.  Has the patient been a risk to others in the past 6 months? No.  Has the patient been a risk to others within the distant past? No.   [place] Scale:  Flowsheet Row Admission (Current) from 09/21/2022 in [facility] ED from 09/20/2022 in [facility] Emergency Department at [facility] ED from 09/18/2022 in [facility] Emergency Department at [facility]  C-SSRS RISK CATEGORY Moderate Risk Moderate Risk No Risk        Prior Inpatient Therapy: Yes.     Prior Outpatient Therapy: Yes.    Alcohol Screening: Patient refused Alcohol Screening Tool: Yes 1. How often do you have a drink containing alcohol?: Never 2. How many drinks containing alcohol do you have on a typical day when you are drinking?: 1 or 2 3. How often do you have six or more drinks on one occasion?: Never AUDIT-C Score: 0 Alcohol Brief Interventions/Follow-up: Patient Refused Substance Abuse History in the last 12 months:   Previous Psychotropic Medications: Yes  Psychological Evaluations: Yes  Past Medical History:  Past Medical History:  Diagnosis Date   Anxiety    Borderline hyperlipidemia    as of 10/2016: TC 134, TG 90, HDL 51, LDL 95 (PCP recently increase rosuvastatin dose from 10 to 20 mg)   Borderline hypertension    Coronary artery disease involving native coronary artery of native heart without angina pectoris 03/01/2020   [facility]:; Dr. Adin Hector:  multivessel CAD => 50% pLAD & 60% mLAD, 70% pLCx & 90% mLCx,  70% mid RCA => CABG x 4   Coronary artery disease with history of myocardial infarction without history of CABG    Depression    Family history of premature coronary artery disease 08/26/2017   Hx of CABG x 4 03/19/2020   [facility], [place], [place] -> CABG x4  (LIMA-LAD, SVG-OM1, SVG-OM2, SVG-dRCA   Hyperlipidemia due to dietary fat intake 08/26/2017    Past Surgical History:  Procedure Laterality Date   CORONARY ARTERY BYPASS GRAFT  03/2020   [facility], [place], [place] -> reportedly CABG x4   GXT/ETT  02/23/2020   From [facility]:: 9:30 min (Stage 3). 10.8 METS 80% MPHR; NO CP but + DOE. -> 1-2 mm Inf De[pression & AVR STE = c/w Ischemia   LAPAROSCOPIC CHOLECYSTECTOMY  1995   LEFT HEART CATH AND CORONARY ANGIOGRAPHY  03/01/2020   [facility] ([facility]; Dr. Adin Hector:  multivessel CAD => 50% pLAD & 60% mLAD, 70% pLCx & 90% mLCx,  70% mid RCA => CABG   NM MYOVIEW LTD  02/2018   Clearly appears abnormal, referred for [facility], [place]   TRANSTHORACIC ECHOCARDIOGRAM  02/16/2020   [facility] ([facility]:  a) Echo 02/16/2020: EF 60% with mild MR and TR.; b) Post Op Limited Echo: EF 55-60%.  Normal wall motion.  Normal RV.  Mild RA dilation.  No pericardial effusion.  Left pleural effusion noted.   TRANSTHORACIC ECHOCARDIOGRAM  07/2021   ([facility])  EF 65 to 70%.  No RWMA other than possible septal motion.  Mild MAC.  Aortic sclerosis.  Otherwise normal.   Family History:  Family History  Problem Relation Age of Onset   Heart attack Mother 63       Recently died; age 58.   Cancer Father    Heart attack Brother    CAD Brother    Heart attack Maternal Grandmother    Diabetes Maternal Grandmother    Colon cancer Paternal Grandmother    Other Paternal Grandfather        Circulatory problems    Tobacco Screening:  Social History   Tobacco Use  Smoking Status Never  Smokeless Tobacco Never    BH Tobacco Counseling     Are you interested in Tobacco Cessation Medications?  N/A, patient does not use tobacco products Counseled patient on smoking cessation:  N/A, patient does not use tobacco  products Reason Tobacco Screening Not Completed: No value filed.       Social History:  Social History   Substance and Sexual Activity  Alcohol Use Not Currently     Social History   Substance and Sexual Activity  Drug Use Never    Additional Social History:                           Allergies:  Not on File Lab Results:  Results for orders placed or performed during the hospital encounter of 09/20/22 (from the past 48 hour(s))  Comprehensive metabolic panel     Status: None   Collection Time: 09/20/22  3:45 PM  Result Value Ref Range   Sodium 141 135 - 145 mmol/L   Potassium 3.5 3.5 - 5.1 mmol/L   Chloride 105 98 - 111 mmol/L   CO2 22 22 - 32 mmol/L   Glucose, Bld 99 70 - 99 mg/dL    Comment: Glucose reference range  applies only to samples taken after fasting for at least 8 hours.   BUN 12 8 - 23 mg/dL   Creatinine, Ser 1.61 0.44 - 1.00 mg/dL   Calcium 9.4 8.9 - 09.6 mg/dL   Total Protein 7.7 6.5 - 8.1 g/dL   Albumin 4.6 3.5 - 5.0 g/dL   AST 30 15 - 41 U/L   ALT 21 0 - 44 U/L   Alkaline Phosphatase 69 38 - 126 U/L   Total Bilirubin 0.7 0.3 - 1.2 mg/dL   GFR, Estimated >04 >54 mL/min    Comment: (NOTE) Calculated using the CKD-EPI Creatinine Equation (2021)    Anion gap 14 5 - 15    Comment: Performed at [facility], [address] [place], [place] [address]  Ethanol     Status: None   Collection Time: 09/20/22  3:45 PM  Result Value Ref Range   Alcohol, Ethyl (B) <10 <10 mg/dL    Comment: (NOTE) Lowest detectable limit for serum alcohol is 10 mg/dL.  For medical purposes only. Performed at [facility], [address], [place], [place] [address]   Salicylate level     Status: Abnormal   Collection Time: 09/20/22  3:45 PM  Result Value Ref Range   Salicylate Lvl <7.0 (L) 7.0 - 30.0 mg/dL    Comment: Performed at [facility], [address], [place], [place] [address]  Acetaminophen  level     Status: Abnormal   Collection Time: 09/20/22  3:45 PM  Result Value Ref Range   Acetaminophen (Tylenol), Serum <10 (L) 10 - 30 ug/mL    Comment: (NOTE) Therapeutic concentrations vary significantly. A range of 10-30 ug/mL  may be an effective concentration for many patients. However, some  are best treated at concentrations outside of this range. Acetaminophen concentrations >150 ug/mL at 4 hours after ingestion  and >50 ug/mL at 12 hours after ingestion are often associated with  toxic reactions.  Performed at [facility], [address] [place], [place] [address]   cbc     Status: Abnormal   Collection Time: 09/20/22  3:45 PM  Result Value Ref Range   WBC 11.2 (H) 4.0 - 10.5 K/uL   RBC 4.41 3.87 - 5.11 MIL/uL   Hemoglobin 13.6 12.0 - 15.0 g/dL   HCT 57.8 46.9 - 62.9 %   MCV 92.5 80.0 - 100.0 fL   MCH 30.8 26.0 - 34.0 pg   MCHC 33.3 30.0 - 36.0 g/dL   RDW 52.8 41.3 - 24.4 %   Platelets 209 150 - 400 K/uL   nRBC 0.0 0.0 - 0.2 %    Comment: Performed at [facility], [address], [place], [place] [address]  Rapid urine drug screen (hospital performed)     Status: Abnormal   Collection Time: 09/21/22  9:31 AM  Result Value Ref Range   Opiates NONE DETECTED NONE DETECTED   Cocaine NONE DETECTED NONE DETECTED   Benzodiazepines POSITIVE (A) NONE DETECTED   Amphetamines NONE DETECTED NONE DETECTED   Tetrahydrocannabinol NONE DETECTED NONE DETECTED   Barbiturates NONE DETECTED NONE DETECTED    Comment: (NOTE) DRUG SCREEN FOR MEDICAL PURPOSES ONLY.  IF CONFIRMATION IS NEEDED FOR ANY PURPOSE, NOTIFY LAB WITHIN 5 DAYS.  LOWEST DETECTABLE LIMITS FOR URINE DRUG SCREEN Drug Class                     Cutoff (ng/mL) Amphetamine and metabolites  1000 Barbiturate and metabolites    200 Benzodiazepine                 200 Opiates and metabolites        300 Cocaine and metabolites        300 THC                             50 Performed at [facility], [address], [place], [place] [address]   Urinalysis, Complete w Microscopic -Urine, Clean Catch     Status: Abnormal   Collection Time: 09/21/22  9:31 AM  Result Value Ref Range   Color, Urine YELLOW YELLOW   APPearance HAZY (A) CLEAR   Specific Gravity, Urine 1.011 1.005 - 1.030   pH 5.0 5.0 - 8.0   Glucose, UA NEGATIVE NEGATIVE mg/dL   Hgb urine dipstick NEGATIVE NEGATIVE   Bilirubin Urine NEGATIVE NEGATIVE   Ketones, ur 20 (A) NEGATIVE mg/dL   Protein, ur NEGATIVE NEGATIVE mg/dL   Nitrite NEGATIVE NEGATIVE   Leukocytes,Ua NEGATIVE NEGATIVE   RBC / HPF 0-5 0 - 5 RBC/hpf   WBC, UA 6-10 0 - 5 WBC/hpf   Bacteria, UA NONE SEEN NONE SEEN   Squamous Epithelial / HPF 0-5 0 - 5 /HPF   Mucus PRESENT     Comment: Performed at [facility], [address], [place], [place] [address]    Blood Alcohol level:  Lab Results  Component Value Date   ETH <10 09/20/2022   ETH <10 07/16/2022    Metabolic Disorder Labs:  Lab Results  Component Value Date   HGBA1C 5.0 07/16/2022   MPG 96.8 07/16/2022   Lab Results  Component Value Date   PROLACTIN 13.4 07/16/2022   Lab Results  Component Value Date   CHOL 112 07/16/2022   TRIG 96 07/16/2022   HDL 44 07/16/2022   CHOLHDL 2.5 07/16/2022   VLDL 19 07/16/2022   LDLCALC 49 07/16/2022   LDLCALC 49 03/03/2022    Current Medications: Current Facility-Administered Medications  Medication Dose Route Frequency Provider Last Rate Last Admin   acetaminophen (TYLENOL) tablet 650 mg  650 mg Oral Q6H PRN Motley-Mangrum, Jadeka A, PMHNP       alum & mag hydroxide-simeth (MAALOX/MYLANTA) 200-200-20 MG/5ML suspension 30 mL  30 mL Oral Q4H PRN Motley-Mangrum, Jadeka A, PMHNP       amantadine (SYMMETREL) capsule 100 mg  100 mg Oral Daily Lewanda Rife, MD   100 mg at 09/22/22 1352   clopidogrel (PLAVIX) tablet 75 mg  75 mg Oral Daily Motley-Mangrum, Jadeka A, PMHNP   75 mg  at 09/22/22 0924   diphenhydrAMINE (BENADRYL) capsule 50 mg  50 mg Oral TID PRN Motley-Mangrum, Jadeka A, PMHNP       Or   diphenhydrAMINE (BENADRYL) injection 50 mg  50 mg Intramuscular TID PRN Motley-Mangrum, Jadeka A, PMHNP       ezetimibe (ZETIA) tablet 10 mg  10 mg Oral Daily Motley-Mangrum, Jadeka A, PMHNP   10 mg at 09/22/22 0924   haloperidol (HALDOL) tablet 5 mg  5 mg Oral TID PRN Motley-Mangrum, Jadeka A, PMHNP       Or   haloperidol lactate (HALDOL) injection 5 mg  5 mg Intramuscular TID PRN Motley-Mangrum, Jadeka A, PMHNP       hydrOXYzine (ATARAX) tablet 25 mg  25 mg Oral TID PRN Motley-Mangrum, Ezra Sites, PMHNP  LORazepam (ATIVAN) tablet 2 mg  2 mg Oral TID PRN Motley-Mangrum, Geralynn Ochs A, PMHNP       Or   LORazepam (ATIVAN) injection 2 mg  2 mg Intramuscular TID PRN Motley-Mangrum, Jadeka A, PMHNP       LORazepam (ATIVAN) tablet 0.5 mg  0.5 mg Oral QHS Motley-Mangrum, Jadeka A, PMHNP   0.5 mg at 09/21/22 2109   magnesium hydroxide (MILK OF MAGNESIA) suspension 30 mL  30 mL Oral Daily PRN Motley-Mangrum, Jadeka A, PMHNP   30 mL at 09/22/22 0924   midodrine (PROAMATINE) tablet 2.5 mg  2.5 mg Oral QAC supper Drusilla Kanner, RPH   2.5 mg at 09/21/22 1658   midodrine (PROAMATINE) tablet 5 mg  5 mg Oral QAC breakfast Motley-Mangrum, Jadeka A, PMHNP   5 mg at 09/22/22 0739   mirtazapine (REMERON) tablet 7.5 mg  7.5 mg Oral QHS Motley-Mangrum, Jadeka A, PMHNP   7.5 mg at 09/21/22 2109   rosuvastatin (CRESTOR) tablet 20 mg  20 mg Oral Daily Motley-Mangrum, Jadeka A, PMHNP   20 mg at 09/22/22 0924   traZODone (DESYREL) tablet 50 mg  50 mg Oral QHS PRN Lewanda Rife, MD       PTA Medications: Medications Prior to Admission  Medication Sig Dispense Refill Last Dose   amoxicillin-clavulanate (AUGMENTIN) 875-125 MG tablet Take 1 tablet by mouth 2 (two) times daily.      clonazePAM (KLONOPIN) 1 MG tablet TAKE ONE TABLET BY MOUTH TWICE DAILY 60 tablet 0    clopidogrel (PLAVIX) 75 MG  tablet Take 1 tablet (75 mg total) by mouth daily. 30 tablet 0    ezetimibe (ZETIA) 10 MG tablet Take 1 tablet (10 mg total) by mouth daily. 30 tablet 0    LORazepam (ATIVAN) 0.5 MG tablet Take 1 tablet (0.5 mg total) by mouth at bedtime. 5 tablet 0    midodrine (PROAMATINE) 5 MG tablet Take 1 tablet (5 mg total) by mouth 2 (two) times daily with a meal. 60 tablet 0    mirtazapine (REMERON) 7.5 MG tablet Take 1 tablet (7.5 mg total) by mouth at bedtime. 30 tablet 2    mupirocin ointment (BACTROBAN) 2 % Apply 1 Application topically 2 (two) times daily.      potassium chloride (KLOR-CON) 10 MEQ tablet Take 1 tablet (10 mEq total) by mouth 2 (two) times daily. 6 tablet 0    rosuvastatin (CRESTOR) 20 MG tablet Take 1 tablet (20 mg total) by mouth daily. 30 tablet 0    traZODone (DESYREL) 50 MG tablet Take 1 tablet (50 mg total) by mouth at bedtime as needed for sleep. 30 tablet 0    TRINTELLIX 5 MG TABS tablet TAKE 1 TABLET ONCE A DAY. 30 tablet 0     Musculoskeletal: Strength & Muscle Tone: within normal limits Gait & Station: normal Patient leans: N/A   Psychiatric Specialty Exam:   Presentation  General Appearance:  Appropriate for Environment   Eye Contact: Fair   Speech: Clear and Coherent   Speech Volume: Decreased   Handedness: Right     Mood and Affect  Mood: Anxious; Hopeless; Depressed   Affect: Congruent; Restricted; Other (comment) (Coarse tremors in both upper extremities, facio oral involuntary movements)     Thought Process  Thought Processes: Coherent; Goal Directed   Descriptions of Associations:Intact   Orientation:Full (Time, Place and Person)   Thought Content:Abstract Reasoning; Logical   History of Schizophrenia/Schizoaffective disorder:No   Duration of Psychotic Symptoms:No data recorded Hallucinations:Hallucinations: None  Ideas of Reference:None   Suicidal Thoughts:Suicidal Thoughts: Yes, Active SI Active Intent and/or Plan: Without  Plan   Homicidal Thoughts:Homicidal Thoughts: No     Sensorium  Memory: Immediate Fair; Recent Fair   Judgment: Impaired   Insight: Fair     Chartered certified accountant: Fair   Attention Span: Fair   Recall: [employer] of Knowledge: Fair   Language: Fair     Psychomotor Activity  Psychomotor Activity: Psychomotor Activity: Extrapyramidal Side Effects (EPS); Restlessness Extrapyramidal Side Effects (EPS): Tardive Dyskinesia     Assets  Assets: Desire for Improvement; Housing     Sleep  Sleep: Sleep: Poor       Physical Exam: Physical Exam Constitutional:      Appearance: Normal appearance.  HENT:     Head: Normocephalic and atraumatic.     Nose: Nose normal.  Eyes:     Pupils: Pupils are equal, round, and reactive to light.  Cardiovascular:     Rate and Rhythm: Normal rate.     Pulses: Normal pulses.  Pulmonary:     Effort: Pulmonary effort is normal.  Skin:    General: Skin is warm.  Neurological:     General: No focal deficit present.     Mental Status: She is alert and oriented to person, place, and time.      Review of Systems  Constitutional:  Negative for chills and fever.  HENT:  Negative for congestion and hearing loss.   Eyes:  Negative for blurred vision and double vision.  Respiratory:  Negative for cough and shortness of breath.   Cardiovascular:  Negative for chest pain and palpitations.  Gastrointestinal:  Negative for nausea and vomiting.  Musculoskeletal:  Positive for joint pain.  Neurological:  Positive for tremors. Negative for dizziness and focal weakness.  Psychiatric/Behavioral:  Positive for depression and suicidal ideas. The patient is nervous/anxious and has insomnia.    Blood pressure (!) 92/55, pulse 61, temperature (!) 97.2 F (36.2 C), resp. rate 16, height 5\' 2"  (1.575 m), weight 49.2 kg, SpO2 98%. Body mass index is 19.84 kg/m.  Treatment Plan Summary: Daily contact with patient to assess and  evaluate symptoms and progress in treatment and Medication management  Observation Level/Precautions:  15 minute checks  Laboratory:    Psychotherapy:    Medications:  Per MAR  Consultations:    Discharge Concerns:    Estimated LOS: 5-6 days  Other:     Physician Treatment Plan for Primary Diagnosis: MDD (major depressive disorder) Long Term Goal(s): Improvement in symptoms so as ready for discharge  Short Term Goals: Ability to identify changes in lifestyle to reduce recurrence of condition will improve, Ability to verbalize feelings will improve, Ability to disclose and discuss suicidal ideas, Ability to demonstrate self-control will improve, Ability to identify and develop effective coping behaviors will improve, and Ability to identify triggers associated with substance abuse/mental health issues will improve  Patient is admitted to locked unit under safety precautions We will start patient on home medication including Remeron 7.5 mg at bedtime for depression We will start patient on amantadine 100 mg p.o. daily, if tolerated well will increase it to BID Will consult neurology to rule out Parkinson's disease Social worker consulted to get collateral and help withdischarge plan   I certify that inpatient services furnished can reasonably be expected to improve the patient's condition.    Lewanda Rife, MD

## 2022-09-22 NOTE — Group Note (Signed)
  Date:  09/22/2022 Time:  11:27 PM  Group Topic/Focus:  Building Self Esteem:   The Focus of this group is helping patients become aware of the effects of self-esteem on their lives, the things they and others do that enhance or undermine their self-esteem, seeing the relationship between their level of self-esteem and the choices they make and learning ways to enhance self-esteem. Coping With Mental Health Crisis:   The purpose of this group is to help patients identify strategies for coping with mental health crisis.  Group discusses possible causes of crisis and ways to manage them effectively. Crisis Planning:   The purpose of this group is to help patients create a crisis plan for use upon discharge or in the future, as needed. Developing a Wellness Toolbox:   The focus of this group is to help patients develop a "wellness toolbox" with skills and strategies to promote recovery upon discharge. Dimensions of Wellness:   The focus of this group is to introduce the topic of wellness and discuss the role each dimension of wellness plays in total health. Healthy Communication:   The focus of this group is to discuss communication, barriers to communication, as well as healthy ways to communicate with others. Making Healthy Choices:   The focus of this group is to help patients identify negative/unhealthy choices they were using prior to admission and identify positive/healthier coping strategies to replace them upon discharge. Managing Feelings:   The focus of this group is to identify what feelings patients have difficulty handling and develop a plan to handle them in a healthier way upon discharge. Self Care:   The focus of this group is to help patients understand the importance of self-care in order to improve or restore emotional, physical, spiritual, interpersonal, and financial health.    Participation Level:  Did Not Attend  Participation Quality:   did not attend  Affect:   did not  attend  Cognitive:   did not attend  Insight: None  Engagement in Group:  None  Modes of Intervention:   did not attend  Additional Comments:    Heather Lucas 09/22/2022, 11:27 PM

## 2022-09-22 NOTE — Progress Notes (Signed)
Patient reports her shaking is getting worse.  Patient asking for Ativan, but there is not an order.   Agreeable to taking hydroxyzine as ordered.    Patient later returned to nurse's station and stated the medication was not helpful (no decrease in shaking).

## 2022-09-22 NOTE — Progress Notes (Signed)
Patient is laying in bed upon approach.   Patient has TD and is not currently taking any medication to control it.  Denies SI at this time.  Denies HI and AVH.  Milk of magnesia given for constipation.  Compliant with scheduled medications (crushed in applesauce).  Patient has sores on her ears and states she brought the cream she has been applying (locked up with her belongings). Dr. Marval Regal made aware.  15 min checks in place for safety. Patient isolated to room with exception of meals.   Patient participated in treatment team meeting.

## 2022-09-22 NOTE — Group Note (Signed)
Recreation Therapy Group Note   Group Topic:Relaxation  Group Date: 09/22/2022 Start Time: 1400 End Time: 1440 Facilitators: Rosina Lowenstein, LRT, CTRS Location: Day room   Group Description: Chair Yoga. LRT and patients discussed the benefits of yoga and how it differs from strength exercises. LRT educated patients on the mental and physical benefits of yoga and deep breathing and how it can be used as a Associate Professor. LRT and patients followed along to a guided yoga session on the television that focused on all parts of the body, as well as deep breathing. Pt encouraged to stop movement at any time if they feel discomfort or pain.   Goal Area(s) Addressed: Patient will practice using relaxation technique. Patient will identify a new coping skill.  Patient will follow multistep directions to reduce anxiety and stress.   Affect/Mood: Calm and Appropriate   Participation Level: Active and Engaged   Participation Quality: Independent   Behavior: Calm and Cooperative   Speech/Thought Process: Coherent   Insight: Good   Judgement: Good   Modes of Intervention: Activity   Patient Response to Interventions:  Attentive, Engaged, Interested , and Receptive   Education Outcome:  Acknowledges education   Clinical Observations/Individualized Feedback: Elianna was active in their participation of session activities and group discussion. Pt completed all exercises appropriately while interacting well with LRT and peers duration of session.   Plan: Continue to engage patient in RT group sessions 2-3x/week.   Rosina Lowenstein, LRT, CTRS 09/22/2022 3:10 PM

## 2022-09-23 ENCOUNTER — Ambulatory Visit: Payer: BC Managed Care – PPO | Admitting: Adult Health

## 2022-09-23 MED ORDER — BENZTROPINE MESYLATE 1 MG PO TABS
1.0000 mg | ORAL_TABLET | Freq: Two times a day (BID) | ORAL | Status: DC
  Administered 2022-09-23 – 2022-09-26 (×6): 1 mg via ORAL
  Filled 2022-09-23 (×6): qty 1

## 2022-09-23 NOTE — Group Note (Signed)
Recreation Therapy Group Note   Group Topic:Goal Setting  Group Date: 09/23/2022 Start Time: 1400 End Time: 1455 Facilitators: Rosina Lowenstein, LRT, CTRS Location:  Day Room  Group Description: Vision Board. Patients were given many different magazines, a glue stick, markers, and a piece of cardstock paper. LRT and pts discussed the importance of having goals in life. LRT and pts discussed the difference between short-term and long-term goals, as well as what a SMART goal is. LRT encouraged pts to create a vision board, with images they picked and then cut out with safety scissors from the magazine, for themselves, that capture their short and long-term goals. LRT encouraged pts to show and explain their vision board to the group. LRT offered to laminate vision board once dry and complete.   Goal Area(s) Addressed:  Patient will gain knowledge of short vs. long term goals.  Patient will identify goals for themselves. Patient will practice setting SMART goals. Patient will verbalize their goals to LRT and peers.   Affect/Mood: N/A   Participation Level: Did not attend    Clinical Observations/Individualized Feedback: Heather Lucas did not attend group.  Plan: Continue to engage patient in RT group sessions 2-3x/week.   Rosina Lowenstein, LRT, CTRS 09/23/2022 3:37 PM

## 2022-09-23 NOTE — Progress Notes (Signed)
[facility] MD Progress Note  09/23/2022 7:17 PM Heather Lucas  MRN:  [id number]  Subjective: Chart reviewed, case discussed with staff.  Patient seen during rounds.  Not much change in mental status since yesterday.  Patient continues to feel depressed and home hopeless.  Has off-and-on suicidal ideation, denies any intention to harm herself on the unit.  Patient was provided with support and reassurance.  Patient was informed that neurology has been consulted to help with patient's involuntary movements.   Principal Problem: MDD (major depressive disorder) Diagnosis: Principal Problem:   MDD (major depressive disorder)   Past Medical History:  Past Medical History:  Diagnosis Date   Anxiety    Borderline hyperlipidemia    as of 10/2016: TC 134, TG 90, HDL 51, LDL 95 (PCP recently increase rosuvastatin dose from 10 to 20 mg)   Borderline hypertension    Coronary artery disease involving native coronary artery of native heart without angina pectoris 03/01/2020   [facility]:; Dr. Adin Hector:  multivessel CAD => 50% pLAD & 60% mLAD, 70% pLCx & 90% mLCx,  70% mid RCA => CABG x 4   Coronary artery disease with history of myocardial infarction without history of CABG    Depression    Family history of premature coronary artery disease 08/26/2017   Hx of CABG x 4 03/19/2020   [facility], [place], [place] -> CABG x4 (LIMA-LAD, SVG-OM1, SVG-OM2, SVG-dRCA   Hyperlipidemia due to dietary fat intake 08/26/2017    Past Surgical History:  Procedure Laterality Date   CORONARY ARTERY BYPASS GRAFT  03/2020   [facility], [place], [place] -> reportedly CABG x4   GXT/ETT  02/23/2020   From [facility]:: 9:30 min (Stage 3). 10.8 METS 80% MPHR; NO CP but + DOE. -> 1-2 mm Inf De[pression & AVR STE = c/w Ischemia   LAPAROSCOPIC CHOLECYSTECTOMY  1995   LEFT HEART CATH AND CORONARY  ANGIOGRAPHY  03/01/2020   [facility] ([facility]; Dr. Adin Hector:  multivessel CAD => 50% pLAD & 60% mLAD, 70% pLCx & 90% mLCx,  70% mid RCA => CABG   NM MYOVIEW LTD  02/2018   Clearly appears abnormal, referred for [facility], [place]   TRANSTHORACIC ECHOCARDIOGRAM  02/16/2020   [facility] ([facility]:  a) Echo 02/16/2020: EF 60% with mild MR and TR.; b) Post Op Limited Echo: EF 55-60%.  Normal wall motion.  Normal RV.  Mild RA dilation.  No pericardial effusion.  Left pleural effusion noted.   TRANSTHORACIC ECHOCARDIOGRAM  07/2021   ([facility])  EF 65 to 70%.  No RWMA other than possible septal motion.  Mild MAC.  Aortic sclerosis.  Otherwise normal.   Family History:  Family History  Problem Relation Age of Onset   Heart attack Mother 51       Recently died; age 32.   Cancer Father    Heart attack Brother    CAD Brother    Heart attack Maternal Grandmother    Diabetes Maternal Grandmother    Colon cancer Paternal Grandmother    Other Paternal Grandfather        Circulatory problems    Social History:  Social History   Substance and Sexual Activity  Alcohol Use Not Currently     Social History   Substance and Sexual Activity  Drug Use Never    Social History  Socioeconomic History   Marital status: Divorced    Spouse name: Not on file   Number of children: Not on file   Years of education: 12   Highest education level: High school graduate  Occupational History    Employer: [employer]    Comment: Works doing Paramedic  Tobacco Use   Smoking status: Never   Smokeless tobacco: Never  Substance and Sexual Activity   Alcohol use: Not Currently   Drug use: Never   Sexual activity: Not Currently  Other Topics Concern   Not on file  Social History Narrative   She is single.  Lives alone.    Does not routinely exercise      About 3 years ago, she moved down to [place], [place]  (in the [place]) delivered with her brother after being born and raised in [place].   Social Determinants of Health   Financial Resource Strain: Not on file  Food Insecurity: No Food Insecurity (09/21/2022)   Hunger Vital Sign    Worried About Running Out of Food in the Last Year: Never true    Ran Out of Food in the Last Year: Never true  Transportation Needs: No Transportation Needs (09/21/2022)   PRAPARE - Administrator, Civil Service (Medical): No    Lack of Transportation (Non-Medical): No  Physical Activity: High Risk (04/15/2019)   Received from [facility]   Physical Activity    How often do you engage in moderate physical activity for 30 minutes or more?: Never    How often do you engage in vigorous physical activity for 20 minutes or more?: Never    How many hours per day do you spend sitting?: More than 8 hours  Stress: Low Risk  (03/06/2021)   Received from [facility]   Stress    Stress in your Life: Not on file    Dealing with Stress: Not on file  Social Connections: Not on file   Additional Social History:                         Sleep: Fair  Appetite:  Poor  Current Medications: Current Facility-Administered Medications  Medication Dose Route Frequency Provider Last Rate Last Admin   acetaminophen (TYLENOL) tablet 650 mg  650 mg Oral Q6H PRN Motley-Mangrum, Jadeka A, PMHNP       alum & mag hydroxide-simeth (MAALOX/MYLANTA) 200-200-20 MG/5ML suspension 30 mL  30 mL Oral Q4H PRN Motley-Mangrum, Jadeka A, PMHNP       amantadine (SYMMETREL) capsule 100 mg  100 mg Oral Daily Marval Regal, Pope Brunty, MD   100 mg at 09/22/22 1352   benztropine (COGENTIN) tablet 1 mg  1 mg Oral BID Lewanda Rife, MD       clonazePAM (KLONOPIN) tablet 1 mg  1 mg Oral BID Lewanda Rife, MD   1 mg at 09/23/22 0958   clopidogrel (PLAVIX) tablet 75 mg  75 mg Oral Daily Motley-Mangrum, Jadeka A, PMHNP   75 mg at 09/23/22 0958   diphenhydrAMINE (BENADRYL)  capsule 50 mg  50 mg Oral TID PRN Motley-Mangrum, Jadeka A, PMHNP       Or   diphenhydrAMINE (BENADRYL) injection 50 mg  50 mg Intramuscular TID PRN Motley-Mangrum, Jadeka A, PMHNP       ezetimibe (ZETIA) tablet 10 mg  10 mg Oral Daily Motley-Mangrum, Jadeka A, PMHNP   10 mg at 09/23/22 0958   haloperidol (HALDOL) tablet 5 mg  5 mg  Oral TID PRN Motley-Mangrum, Geralynn Ochs A, PMHNP       Or   haloperidol lactate (HALDOL) injection 5 mg  5 mg Intramuscular TID PRN Motley-Mangrum, Geralynn Ochs A, PMHNP       hydrOXYzine (ATARAX) tablet 25 mg  25 mg Oral TID PRN Motley-Mangrum, Jadeka A, PMHNP   25 mg at 09/23/22 1734   LORazepam (ATIVAN) tablet 2 mg  2 mg Oral TID PRN Motley-Mangrum, Geralynn Ochs A, PMHNP       Or   LORazepam (ATIVAN) injection 2 mg  2 mg Intramuscular TID PRN Motley-Mangrum, Jadeka A, PMHNP       magnesium hydroxide (MILK OF MAGNESIA) suspension 30 mL  30 mL Oral Daily PRN Motley-Mangrum, Jadeka A, PMHNP   30 mL at 09/22/22 0924   midodrine (PROAMATINE) tablet 2.5 mg  2.5 mg Oral QAC supper Drusilla Kanner, RPH   2.5 mg at 09/23/22 1725   midodrine (PROAMATINE) tablet 5 mg  5 mg Oral QAC breakfast Motley-Mangrum, Jadeka A, PMHNP   5 mg at 09/23/22 0744   mirtazapine (REMERON) tablet 7.5 mg  7.5 mg Oral QHS Motley-Mangrum, Jadeka A, PMHNP   7.5 mg at 09/22/22 2208   mupirocin cream (BACTROBAN) 2 %   Topical BID Lewanda Rife, MD   Given at 09/23/22 1148   rosuvastatin (CRESTOR) tablet 20 mg  20 mg Oral Daily Motley-Mangrum, Jadeka A, PMHNP   20 mg at 09/23/22 0958   traZODone (DESYREL) tablet 50 mg  50 mg Oral QHS PRN Lewanda Rife, MD   50 mg at 09/22/22 2202    Lab Results: No results found for this or any previous visit (from the past 48 hour(s)).  Blood Alcohol level:  Lab Results  Component Value Date   ETH <10 09/20/2022   ETH <10 07/16/2022    Metabolic Disorder Labs: Lab Results  Component Value Date   HGBA1C 5.0 07/16/2022   MPG 96.8 07/16/2022   Lab Results   Component Value Date   PROLACTIN 13.4 07/16/2022   Lab Results  Component Value Date   CHOL 112 07/16/2022   TRIG 96 07/16/2022   HDL 44 07/16/2022   CHOLHDL 2.5 07/16/2022   VLDL 19 07/16/2022   LDLCALC 49 07/16/2022   LDLCALC 49 03/03/2022     Musculoskeletal: Strength & Muscle Tone: within normal limits Gait & Station: normal Patient leans: N/A   Psychiatric Specialty Exam:   Presentation  General Appearance:  Appropriate for Environment   Eye Contact: Fair   Speech: Clear and Coherent   Speech Volume: Decreased   Handedness: Right     Mood and Affect  Mood: Anxious; Hopeless; Depressed   Affect: Congruent; Restricted; Other (comment) (Coarse tremors in both upper extremities, facio oral involuntary movements)     Thought Process  Thought Processes: Coherent; Goal Directed   Descriptions of Associations:Intact   Orientation:Full (Time, Place and Person)   Thought Content:Abstract Reasoning; Logical   History of Schizophrenia/Schizoaffective disorder:No   Hallucinations:Hallucinations: None   Ideas of Reference:None   Suicidal Thoughts:Suicidal Thoughts: Yes, Active SI Active Intent and/or Plan: Without Plan   Homicidal Thoughts:Homicidal Thoughts: No     Sensorium  Memory: Immediate Fair; Recent Fair   Judgment: Impaired   Insight: Fair     Chartered certified accountant: Fair   Attention Span: Fair   Recall: [employer] of Knowledge: Fair   Language: Fair     Psychomotor Activity  Psychomotor Activity: Psychomotor Activity: Extrapyramidal Side Effects (EPS); Restlessness Extrapyramidal Side  Effects (EPS): Tardive Dyskinesia     Assets  Assets: Desire for Improvement; Housing     Sleep  Sleep: Sleep: Poor       Physical Exam: Physical Exam Constitutional:      Appearance: Normal appearance.  HENT:     Head: Normocephalic and atraumatic.     Nose: Nose normal.  Eyes:     Pupils: Pupils  are equal, round, and reactive to light.  Cardiovascular:     Rate and Rhythm: Normal rate.     Pulses: Normal pulses.  Pulmonary:     Effort: Pulmonary effort is normal.  Skin:    General: Skin is warm.  Neurological:     General: No focal deficit present.     Mental Status: She is alert and oriented to person, place, and time.      Review of Systems  Constitutional:  Negative for chills and fever.  HENT:  Negative for congestion and hearing loss.   Eyes:  Negative for blurred vision and double vision.  Respiratory:  Negative for cough and shortness of breath.   Cardiovascular:  Negative for chest pain and palpitations.  Gastrointestinal:  Negative for nausea and vomiting.  Musculoskeletal:  Positive for joint pain.  Neurological:  Positive for tremors. Negative for dizziness and focal weakness.  Psychiatric/Behavioral:  Positive for depression and suicidal ideas. The patient is nervous/anxious and has insomnia.    Blood pressure (!) 112/55, pulse (!) 59, temperature (!) 97.5 F (36.4 C), resp. rate 16, height 5\' 2"  (1.575 m), weight 49.2 kg, SpO2 98%. Body mass index is 19.84 kg/m.   Treatment Plan Summary: Daily contact with patient to assess and evaluate symptoms and progress in treatment and Medication management  Lewanda Rife, MD

## 2022-09-23 NOTE — Progress Notes (Signed)
Patient currently hospitalized.
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
## 2022-09-23 NOTE — Plan of Care (Signed)
  Problem: Education: Goal: Knowledge of General Education information will improve Description: Including pain rating scale, medication(s)/side effects and non-pharmacologic comfort measures Outcome: Progressing   Problem: Nutrition: Goal: Adequate nutrition will be maintained Outcome: Progressing   Problem: Safety: Goal: Ability to remain free from injury will improve Outcome: Progressing   Problem: Skin Integrity: Goal: Risk for impaired skin integrity will decrease Outcome: Progressing   Problem: Coping: Goal: Level of anxiety will decrease Outcome: Not Progressing

## 2022-09-23 NOTE — [facility] Suicide Risk Assessment (Signed)
[facility] INPATIENT:  Family/Significant Other Suicide Prevention Education  Suicide Prevention Education:  Patient Refusal for Family/Significant Other Suicide Prevention Education: The patient Heather Lucas has refused to provide written consent for family/significant other to be provided Family/Significant Other Suicide Prevention Education during admission and/or prior to discharge.  Physician notified.  Elza Rafter 09/23/2022, 11:44 AM

## 2022-09-23 NOTE — Plan of Care (Signed)
Assumed care of patient @ 1930, pt. Continues to be anxious, c/o increase tremors with the use Amantadine. Patient was given her hs medications earlier than scheduled time upon request, which seemed effective. Patient than approach this Thereasa Parkin and stated she does not want to take Amantadine again "do not crush it with my other medicine", pt. Encouraged to discuss concerns with her doctor in the am.Pt denied having thought, plan or intent to harm herself.Patient is independent with her ADL's, she remain a fall risk, not incident noted this pm. Will continue to monitor and assess for safety.   Problem: Education: Goal: Knowledge of General Education information will improve Description: Including pain rating scale, medication(s)/side effects and non-pharmacologic comfort measures Outcome: Progressing   Problem: Health Behavior/Discharge Planning: Goal: Ability to manage health-related needs will improve Outcome: Progressing   Problem: Clinical Measurements: Goal: Respiratory complications will improve Outcome: Not Applicable   Problem: Activity: Goal: Risk for activity intolerance will decrease Outcome: Progressing   Problem: Nutrition: Goal: Adequate nutrition will be maintained Outcome: Progressing   Problem: Safety: Goal: Ability to remain free from injury will improve Outcome: Progressing   Problem: Skin Integrity: Goal: Risk for impaired skin integrity will decrease Outcome: Progressing

## 2022-09-23 NOTE — Group Note (Signed)
Date:  09/23/2022 Time:  9:48 PM  Group Topic/Focus:  Building Self Esteem:   The Focus of this group is helping patients become aware of the effects of self-esteem on their lives, the things they and others do that enhance or undermine their self-esteem, seeing the relationship between their level of self-esteem and the choices they make and learning ways to enhance self-esteem. Coping With Mental Health Crisis:   The purpose of this group is to help patients identify strategies for coping with mental health crisis.  Group discusses possible causes of crisis and ways to manage them effectively. Conflict Resolution:   The focus of this group is to discuss the conflict resolution process and how it may be used upon discharge. Crisis Planning:   The purpose of this group is to help patients create a crisis plan for use upon discharge or in the future, as needed. Developing a Wellness Toolbox:   The focus of this group is to help patients develop a "wellness toolbox" with skills and strategies to promote recovery upon discharge. Making Healthy Choices:   The focus of this group is to help patients identify negative/unhealthy choices they were using prior to admission and identify positive/healthier coping strategies to replace them upon discharge. Self Care:   The focus of this group is to help patients understand the importance of self-care in order to improve or restore emotional, physical, spiritual, interpersonal, and financial health.    Participation Level:  Active  Participation Quality:  Attentive  Affect:  Appropriate  Cognitive:  Appropriate  Insight: Improving  Engagement in Group:  Engaged  Modes of Intervention:  Discussion  Additional Comments:    Maeola Harman 09/23/2022, 9:48 PM

## 2022-09-23 NOTE — Progress Notes (Signed)
Patient pleasant and cooperative.  Patient reports she no longer wants to take Amantadine because it made her shaking worse.  Denies SI/HI and AVH.  Endorses anxiety.  Compliant with all other scheduled medications. 15 min checks in place for safety.   Patient isolates to room with the exception for meals.    Neuro consult ordered by Dr. Marval Regal.  Patient asking for medication to help her shaking, but per Dr. Marval Regal would like patient to wait until after consult.

## 2022-09-23 NOTE — Consult Note (Signed)
Neurology Consultation Reason for Consult: Abnormal movements Referring Physician: Marval Regal, M  CC: Abnormal movements  History is obtained from: Patient  HPI: Heather Lucas is a 62 y.o. female who reports a history of abnormal movements that have been going on for multiple months.  She reports that she was on Abilify for a few months, and then started having abnormal movements.  She was started on Ingrezza and feels that this did not help at all.  Since stopping Abilify olanzapine, she does feel like she has been having some improvement over the past few months.  Out of concern for possible parkinsonism, she was started on amantadine last night, but felt like it worsened her tremors.  The tremors are worse with rest, but she notices them all the time.   Past Medical History:  Diagnosis Date   Anxiety    Borderline hyperlipidemia    as of 10/2016: TC 134, TG 90, HDL 51, LDL 95 (PCP recently increase rosuvastatin dose from 10 to 20 mg)   Borderline hypertension    Coronary artery disease involving native coronary artery of native heart without angina pectoris 03/01/2020   [facility]:; Dr. Adin Hector:  multivessel CAD => 50% pLAD & 60% mLAD, 70% pLCx & 90% mLCx,  70% mid RCA => CABG x 4   Coronary artery disease with history of myocardial infarction without history of CABG    Depression    Family history of premature coronary artery disease 08/26/2017   Hx of CABG x 4 03/19/2020   [facility], [place], [place] -> CABG x4 (LIMA-LAD, SVG-OM1, SVG-OM2, SVG-dRCA   Hyperlipidemia due to dietary fat intake 08/26/2017     Family History  Problem Relation Age of Onset   Heart attack Mother 5       Recently died; age 64.   Cancer Father    Heart attack Brother    CAD Brother    Heart attack Maternal Grandmother    Diabetes Maternal Grandmother    Colon cancer Paternal Grandmother    Other Paternal Grandfather         Circulatory problems     Social History:  reports that she has never smoked. She has never used smokeless tobacco. She reports that she does not currently use alcohol. She reports that she does not use drugs.   Exam: Current vital signs: BP 127/73 (BP Location: Left Arm)   Pulse 72   Temp (!) 97.5 F (36.4 C)   Resp 14   Ht 5\' 2"  (1.575 m)   Wt 49.2 kg   SpO2 98%   BMI 19.84 kg/m  Vital signs in last 24 hours: Temp:  [97.5 F (36.4 C)] 97.5 F (36.4 C) (08/13 1944) Pulse Rate:  [59-72] 72 (08/13 1944) Resp:  [14-16] 14 (08/13 1944) BP: (112-127)/(55-73) 127/73 (08/13 1944) SpO2:  [98 %] 98 % (08/13 1944)   Physical Exam  Appears well-developed and well-nourished.   Neuro: Mental Status: Patient is awake, alert, oriented to person, place, month, year, and situation. Patient is able to give a clear and coherent history. No signs of aphasia or neglect Cranial Nerves: II: Visual Fields are full. Pupils are equal, round, and reactive to light.   III,IV, VI: EOMI without ptosis or diploplia.  She does not cooperate with looking up, but it appears to be a cooperation as opposed to inability. V: Facial sensation is symmetric to temperature VII: Facial movement is symmetric.  VIII: hearing is intact to  voice X: Uvula elevates symmetrically XI: Shoulder shrug is symmetric. XII: tongue is midline without atrophy or fasciculations.  Motor: Tone is normal. Bulk is normal. 5/5 strength was present in all four extremities.  Sensory: Sensation is symmetric to light touch and temperature in the arms and legs. Cerebellar: FNF and HKS are intact bilaterally  She has significant tremors that are more prominent with rest and abort with any type of movement.  The tremor involves her jaw as well as bilateral arms > legs.  The tremors do appear to be distractible, but given they abort with intention, this is less definite.  There is no past-pointing or ataxia to suggest cerebellar  dysfunction.    I have reviewed labs in epic and the results pertinent to this consultation are:   I have reviewed the images obtained: MRI from June is negative  Impression: 62 year old female with abnormal movements that started following neuroleptic use.  There is no definite increased tone to suggest parkinsonism, but the tremor is more prominent with rest.  Tardive tremor is a possibility.  She has had no improvement with Ingrezza, and therefore I think would be reasonable to try Cogentin to see if she gets some improvement with this medication.  There is some question of distractibility, and therefore psychogenic tremor might need to be considered, but I would favor treating it as EPS at this time.  Recommendations: 1) I would favor starting Cogentin and giving it some time to see if it has affect. 2) would recommend follow-up with outpatient neurologist familiar with movement disorders. 3) neurology be available as needed.   Ritta Slot, MD [facility] [phone]  If 7pm- 7am, please page neurology on call as listed in [facility]ION.

## 2022-09-23 NOTE — Group Note (Signed)
LCSW Group Therapy Note   Group Date: 09/23/2022 Start Time: 1315 End Time: 1400   Type of Therapy and Topic:  Group Therapy: Challenging Core Beliefs  Participation Level:  Did Not Attend  Description of Group:  Patients were educated about core beliefs and asked to identify one harmful core belief that they have. Patients were asked to explore from where those beliefs originate. Patients were asked to discuss how those beliefs make them feel and the resulting behaviors of those beliefs. They were then be asked if those beliefs are true and, if so, what evidence they have to support them. Lastly, group members were challenged to replace those negative core beliefs with helpful beliefs.   Therapeutic Goals:   1. Patient will identify harmful core beliefs and explore the origins of such beliefs. 2. Patient will identify feelings and behaviors that result from those core beliefs. 3. Patient will discuss whether such beliefs are true. 4.  Patient will replace harmful core beliefs with helpful ones.  Summary of Patient Progress:  Pt did not attend   Therapeutic Modalities: Cognitive Behavioral Therapy; Solution-Focused Therapy   Elza Rafter, [place] 09/23/2022  2:47 PM

## 2022-09-24 NOTE — Plan of Care (Signed)
D: Pt alert and oriented. Pt denies experiencing any anxiety/depression at this time. Pt denies experiencing any pain at this time. Pt denies experiencing any SI/HI, or AVH at this time.   Pt refused symmetrel. Pt stated it's for parkinson and she does not want it. MD notified and aware.   A: Scheduled medications administered to pt, per MD orders. Support and encouragement provided. Frequent verbal contact made. Routine safety checks conducted q15 minutes.   R: No adverse drug reactions noted. Pt verbally contracts for safety at this time. Pt not compliant with all medications and treatment plan. Pt interacts well but minimally with others on the unit. Pt remains safe at this time. Plan of care ongoing.  Problem: Education: Goal: Knowledge of General Education information will improve Description: Including pain rating scale, medication(s)/side effects and non-pharmacologic comfort measures Outcome: Progressing   Problem: Coping: Goal: Level of anxiety will decrease Outcome: Progressing

## 2022-09-24 NOTE — Group Note (Signed)
Date:  09/24/2022 Time:  1:12 PM  Group Topic/Focus:  Coping With Mental Health Crisis:   The purpose of this group is to help patients identify strategies for coping with mental health crisis.  Group discusses possible causes of crisis and ways to manage them effectively. Healthy Communication:   The focus of this group is to discuss communication, barriers to communication, as well as healthy ways to communicate with others. Overcoming Stress:   The focus of this group is to define stress and help patients assess their triggers.  We went outside to get some fresh air and we talked about healthy ways to cope with stress and mental health issues.     Participation Level:  Did Not Attend  Participation Quality:    Affect:    Cognitive:    Insight:   Engagement in Group:    Modes of Intervention:    Additional Comments:    Ariaunna Longsworth L Lotta [place]en[place] 09/24/2022, 1:12 PM

## 2022-09-24 NOTE — Progress Notes (Signed)
[facility] MD Progress Note  09/24/2022  Heather Lucas  MRN:  [id number]  Subjective: Chart reviewed, case discussed with staff.  Patient seen during rounds.  Neurology input is appreciated.  Not much change in mental status since yesterday.  Patient continues to feel depressed and home hopeless.  Has off-and-on suicidal ideation, denies any intention to harm herself on the unit.  Patient was provided with support and reassurance. Patient reports cogentin had helped some.   Principal Problem: MDD (major depressive disorder) Diagnosis: Principal Problem:   MDD (major depressive disorder)   Past Medical History:  Past Medical History:  Diagnosis Date   Anxiety    Borderline hyperlipidemia    as of 10/2016: TC 134, TG 90, HDL 51, LDL 95 (PCP recently increase rosuvastatin dose from 10 to 20 mg)   Borderline hypertension    Coronary artery disease involving native coronary artery of native heart without angina pectoris 03/01/2020   [facility]:; Dr. Adin Hector:  multivessel CAD => 50% pLAD & 60% mLAD, 70% pLCx & 90% mLCx,  70% mid RCA => CABG x 4   Coronary artery disease with history of myocardial infarction without history of CABG    Depression    Family history of premature coronary artery disease 08/26/2017   Hx of CABG x 4 03/19/2020   [facility], [place], [place] -> CABG x4 (LIMA-LAD, SVG-OM1, SVG-OM2, SVG-dRCA   Hyperlipidemia due to dietary fat intake 08/26/2017    Past Surgical History:  Procedure Laterality Date   CORONARY ARTERY BYPASS GRAFT  03/2020   [facility], [place], [place] -> reportedly CABG x4   GXT/ETT  02/23/2020   From [facility]:: 9:30 min (Stage 3). 10.8 METS 80% MPHR; NO CP but + DOE. -> 1-2 mm Inf De[pression & AVR STE = c/w Ischemia   LAPAROSCOPIC CHOLECYSTECTOMY  1995   LEFT HEART CATH AND CORONARY ANGIOGRAPHY  03/01/2020   [facility]; Dr. Adin Hector:  multivessel CAD => 50% pLAD & 60% mLAD, 70% pLCx & 90% mLCx,  70% mid RCA => CABG   NM MYOVIEW LTD  02/2018   Clearly appears abnormal, referred for [facility], [place]   TRANSTHORACIC ECHOCARDIOGRAM  02/16/2020   [facility] ([facility]:  a) Echo 02/16/2020: EF 60% with mild MR and TR.; b) Post Op Limited Echo: EF 55-60%.  Normal wall motion.  Normal RV.  Mild RA dilation.  No pericardial effusion.  Left pleural effusion noted.   TRANSTHORACIC ECHOCARDIOGRAM  07/2021   ([facility])  EF 65 to 70%.  No RWMA other than possible septal motion.  Mild MAC.  Aortic sclerosis.  Otherwise normal.   Family History:  Family History  Problem Relation Age of Onset   Heart attack Mother 31       Recently died; age 56.   Cancer Father    Heart attack Brother    CAD Brother    Heart attack Maternal Grandmother    Diabetes Maternal Grandmother    Colon cancer Paternal Grandmother    Other Paternal Grandfather        Circulatory problems    Social History:  Social History   Substance and Sexual Activity  Alcohol Use Not Currently     Social History   Substance and Sexual Activity  Drug Use Never    Social History   Socioeconomic History   Marital  status: Divorced    Spouse name: Not on file   Number of children: Not on file   Years of education: 12   Highest education level: High school graduate  Occupational History    Employer: [employer]    Comment: Works doing Paramedic  Tobacco Use   Smoking status: Never   Smokeless tobacco: Never  Substance and Sexual Activity   Alcohol use: Not Currently   Drug use: Never   Sexual activity: Not Currently  Other Topics Concern   Not on file  Social History Narrative   She is single.  Lives alone.    Does not routinely exercise      About 3 years ago, she moved down to [place], [place] (in the [place]) delivered with her  brother after being born and raised in [place].   Social Determinants of Health   Financial Resource Strain: Not on file  Food Insecurity: No Food Insecurity (09/21/2022)   Hunger Vital Sign    Worried About Running Out of Food in the Last Year: Never true    Ran Out of Food in the Last Year: Never true  Transportation Needs: No Transportation Needs (09/21/2022)   PRAPARE - Administrator, Civil Service (Medical): No    Lack of Transportation (Non-Medical): No  Physical Activity: High Risk (04/15/2019)   Received from [facility]   Physical Activity    How often do you engage in moderate physical activity for 30 minutes or more?: Never    How often do you engage in vigorous physical activity for 20 minutes or more?: Never    How many hours per day do you spend sitting?: More than 8 hours  Stress: Low Risk  (03/06/2021)   Received from [facility]   Stress    Stress in your Life: Not on file    Dealing with Stress: Not on file  Social Connections: Not on file   Additional Social History:                         Sleep: Fair  Appetite:  Poor  Current Medications: Current Facility-Administered Medications  Medication Dose Route Frequency Provider Last Rate Last Admin   acetaminophen (TYLENOL) tablet 650 mg  650 mg Oral Q6H PRN Motley-Mangrum, Jadeka A, PMHNP       alum & mag hydroxide-simeth (MAALOX/MYLANTA) 200-200-20 MG/5ML suspension 30 mL  30 mL Oral Q4H PRN Motley-Mangrum, Jadeka A, PMHNP       amantadine (SYMMETREL) capsule 100 mg  100 mg Oral Daily Lewanda Rife, MD   100 mg at 09/22/22 1352   benztropine (COGENTIN) tablet 1 mg  1 mg Oral BID Lewanda Rife, MD   1 mg at 09/24/22 1017   clonazePAM (KLONOPIN) tablet 1 mg  1 mg Oral BID Lewanda Rife, MD   1 mg at 09/24/22 1018   clopidogrel (PLAVIX) tablet 75 mg  75 mg Oral Daily Motley-Mangrum, Jadeka A, PMHNP   75 mg at 09/24/22 1016   diphenhydrAMINE (BENADRYL) capsule 50 mg  50 mg  Oral TID PRN Motley-Mangrum, Jadeka A, PMHNP       Or   diphenhydrAMINE (BENADRYL) injection 50 mg  50 mg Intramuscular TID PRN Motley-Mangrum, Jadeka A, PMHNP       ezetimibe (ZETIA) tablet 10 mg  10 mg Oral Daily Motley-Mangrum, Jadeka A, PMHNP   10 mg at 09/24/22 1018   haloperidol (HALDOL) tablet 5 mg  5 mg Oral TID  PRN Motley-Mangrum, Geralynn Ochs A, PMHNP       Or   haloperidol lactate (HALDOL) injection 5 mg  5 mg Intramuscular TID PRN Motley-Mangrum, Geralynn Ochs A, PMHNP       hydrOXYzine (ATARAX) tablet 25 mg  25 mg Oral TID PRN Motley-Mangrum, Jadeka A, PMHNP   25 mg at 09/23/22 1734   LORazepam (ATIVAN) tablet 2 mg  2 mg Oral TID PRN Motley-Mangrum, Geralynn Ochs A, PMHNP       Or   LORazepam (ATIVAN) injection 2 mg  2 mg Intramuscular TID PRN Motley-Mangrum, Jadeka A, PMHNP       magnesium hydroxide (MILK OF MAGNESIA) suspension 30 mL  30 mL Oral Daily PRN Motley-Mangrum, Jadeka A, PMHNP   30 mL at 09/24/22 1027   midodrine (PROAMATINE) tablet 2.5 mg  2.5 mg Oral QAC supper Drusilla Kanner, RPH   2.5 mg at 09/23/22 1725   midodrine (PROAMATINE) tablet 5 mg  5 mg Oral QAC breakfast Motley-Mangrum, Jadeka A, PMHNP   5 mg at 09/24/22 0806   mirtazapine (REMERON) tablet 7.5 mg  7.5 mg Oral QHS Motley-Mangrum, Jadeka A, PMHNP   7.5 mg at 09/23/22 2111   mupirocin cream (BACTROBAN) 2 %   Topical BID Lewanda Rife, MD   Given at 09/24/22 1026   rosuvastatin (CRESTOR) tablet 20 mg  20 mg Oral Daily Motley-Mangrum, Jadeka A, PMHNP   20 mg at 09/24/22 1017   traZODone (DESYREL) tablet 50 mg  50 mg Oral QHS PRN Lewanda Rife, MD   50 mg at 09/23/22 2110    Lab Results: No results found for this or any previous visit (from the past 48 hour(s)).  Blood Alcohol level:  Lab Results  Component Value Date   ETH <10 09/20/2022   ETH <10 07/16/2022    Metabolic Disorder Labs: Lab Results  Component Value Date   HGBA1C 5.0 07/16/2022   MPG 96.8 07/16/2022   Lab Results  Component Value Date    PROLACTIN 13.4 07/16/2022   Lab Results  Component Value Date   CHOL 112 07/16/2022   TRIG 96 07/16/2022   HDL 44 07/16/2022   CHOLHDL 2.5 07/16/2022   VLDL 19 07/16/2022   LDLCALC 49 07/16/2022   LDLCALC 49 03/03/2022     Musculoskeletal: Strength & Muscle Tone: within normal limits Gait & Station: normal Patient leans: N/A   Psychiatric Specialty Exam:   Presentation  General Appearance:  Appropriate for Environment   Eye Contact: Fair   Speech: Clear and Coherent   Speech Volume: Decreased   Handedness: Right     Mood and Affect  Mood: Anxious; Hopeless; Depressed   Affect: Congruent; Restricted; Other (comment) (Coarse tremors in both upper extremities, facio oral involuntary movements)     Thought Process  Thought Processes: Coherent; Goal Directed   Descriptions of Associations:Intact   Orientation:Full (Time, Place and Person)   Thought Content:Abstract Reasoning; Logical   History of Schizophrenia/Schizoaffective disorder:No   Hallucinations:Hallucinations: None   Ideas of Reference:None   Suicidal Thoughts:Suicidal Thoughts: Decreased   Homicidal Thoughts:Homicidal Thoughts: No     Sensorium  Memory: Immediate Fair; Recent Fair   Judgment: Impaired   Insight: Fair     Chartered certified accountant: Fair   Attention Span: Fair   Recall: [employer] of Knowledge: Fair   Language: Fair     Psychomotor Activity  Psychomotor Activity: Psychomotor Activity: Extrapyramidal Side Effects (EPS); Restlessness Extrapyramidal Side Effects (EPS): Tardive Dyskinesia     Assets  Assets: Desire for Improvement; Housing     Sleep  Sleep: Sleep: Improving       Physical Exam: Physical Exam Constitutional:      Appearance: Normal appearance.  HENT:     Head: Normocephalic and atraumatic.     Nose: Nose normal.  Eyes:     Pupils: Pupils are equal, round, and reactive to light.  Cardiovascular:     Rate  and Rhythm: Normal rate.     Pulses: Normal pulses.  Pulmonary:     Effort: Pulmonary effort is normal.  Skin:    General: Skin is warm.  Neurological:     General: No focal deficit present.     Mental Status: She is alert and oriented to person, place, and time.      Review of Systems  Constitutional:  Negative for chills and fever.  HENT:  Negative for congestion and hearing loss.   Eyes:  Negative for blurred vision and double vision.  Respiratory:  Negative for cough and shortness of breath.   Cardiovascular:  Negative for chest pain and palpitations.  Gastrointestinal:  Negative for nausea and vomiting.  Musculoskeletal:  Positive for joint pain.  Neurological:  Positive for tremors. Negative for dizziness and focal weakness.    Blood pressure (!) 95/53, pulse 68, temperature (!) 97.1 F (36.2 C), resp. rate 16, height 5\' 2"  (1.575 m), weight 49.2 kg, SpO2 99%. Body mass index is 19.84 kg/m.   Treatment Plan Summary: Daily contact with patient to assess and evaluate symptoms and progress in treatment and Medication management  Patient is admitted to locked unit under safety precautions We will start patient on home medication including Remeron 7.5 mg at bedtime for depression Will discontinue amantadine 100 mg p.o. daily, patient reports her symptoms are worse after taking amantadine  Appreciate neurology consult and recommendation, patient has been started on Cogentin Social worker consulted to get collateral and help withdischarge plan   Lewanda Rife, MD

## 2022-09-24 NOTE — Group Note (Signed)
Recreation Therapy Group Note   Group Topic:Self-Esteem  Group Date: 09/24/2022 Start Time: 1400 End Time: 1500 Facilitators: Rosina Lowenstein, LRT, CTRS Location:  Day room  Group Description: Positive Focus. Patients are given a handout that has 9 different boxes on it. Each box has a different prompt on it that requires you to identify and add something positive about themselves. LRT encourages pts to share at least two of their boxes to the group. LRT and pts discuss the importance of "thinking positive", self-esteem and how they can apply it to their everyday life post-discharge. After completing worksheet, patients played Positive Affirmation Bingo and won stress balls as prizes.   Goal Area(s) Addressed: Patient will identify positive qualities about themselves. Patient will identify definition of "self-esteem". Patients will identify the difference between positive and negative self-esteem.  Patient will recite positive qualities and affirmations aloud to the group.   Affect/Mood: N/A   Participation Level: Did not attend    Clinical Observations/Individualized Feedback: Christine did not attend group.  Plan: Continue to engage patient in RT group sessions 2-3x/week.   Rosina Lowenstein, LRT, CTRS 09/24/2022 3:14 PM

## 2022-09-24 NOTE — Plan of Care (Signed)
  Problem: Education: Goal: Knowledge of General Education information will improve Description: Including pain rating scale, medication(s)/side effects and non-pharmacologic comfort measures Outcome: Not Progressing   Problem: Health Behavior/Discharge Planning: Goal: Ability to manage health-related needs will improve Outcome: Not Progressing   Problem: Clinical Measurements: Goal: Ability to maintain clinical measurements within normal limits will improve Outcome: Not Progressing Goal: Diagnostic test results will improve Outcome: Not Progressing Goal: Cardiovascular complication will be avoided Outcome: Not Progressing   Problem: Activity: Goal: Risk for activity intolerance will decrease Outcome: Not Progressing   Problem: Nutrition: Goal: Adequate nutrition will be maintained Outcome: Not Progressing   Problem: Coping: Goal: Level of anxiety will decrease Outcome: Not Progressing   Problem: Elimination: Goal: Will not experience complications related to bowel motility Outcome: Not Progressing   Problem: Pain Managment: Goal: General experience of comfort will improve Outcome: Not Progressing   Problem: Safety: Goal: Ability to remain free from injury will improve Outcome: Not Progressing   Problem: Skin Integrity: Goal: Risk for impaired skin integrity will decrease Outcome: Not Progressing

## 2022-09-25 ENCOUNTER — Telehealth: Payer: Self-pay | Admitting: Adult Health

## 2022-09-25 NOTE — Plan of Care (Signed)
  Problem: Education: Goal: Knowledge of General Education information will improve Description: Including pain rating scale, medication(s)/side effects and non-pharmacologic comfort measures Outcome: Progressing   Problem: Health Behavior/Discharge Planning: Goal: Ability to manage health-related needs will improve Outcome: Progressing   Problem: Clinical Measurements: Goal: Ability to maintain clinical measurements within normal limits will improve Outcome: Progressing Goal: Diagnostic test results will improve Outcome: Progressing Goal: Cardiovascular complication will be avoided Outcome: Progressing   Problem: Activity: Goal: Risk for activity intolerance will decrease Outcome: Progressing   Problem: Nutrition: Goal: Adequate nutrition will be maintained Outcome: Progressing   Problem: Self-Concept: Goal: Ability to disclose and discuss suicidal ideas will improve Outcome: Progressing Goal: Will verbalize positive feelings about self Outcome: Progressing

## 2022-09-25 NOTE — Group Note (Signed)
[facility] LCSW Group Therapy Note   Group Date: 09/25/2022 Start Time: 1315 End Time: 1345   Type of Therapy/Topic:  Group Therapy:  Balance in Life  Participation Level:  Did Not Attend   Description of Group:    This group will address the concept of balance and how it feels and looks when one is unbalanced. Patients will be encouraged to process areas in their lives that are out of balance, and identify reasons for remaining unbalanced. Facilitators will guide patients utilizing problem- solving interventions to address and correct the stressor making their life unbalanced. Understanding and applying boundaries will be explored and addressed for obtaining  and maintaining a balanced life. Patients will be encouraged to explore ways to assertively make their unbalanced needs known to significant others in their lives, using other group members and facilitator for support and feedback.  Therapeutic Goals: Patient will identify two or more emotions or situations they have that consume much of in their lives. Patient will identify signs/triggers that life has become out of balance:  Patient will identify two ways to set boundaries in order to achieve balance in their lives:  Patient will demonstrate ability to communicate their needs through discussion and/or role plays  Summary of Patient Progress:    X    Therapeutic Modalities:   Cognitive Behavioral Therapy Solution-Focused Therapy Assertiveness Training   Elza Rafter, LCSWA

## 2022-09-25 NOTE — Progress Notes (Signed)
   09/25/22 0731  Psych Admission Type (Psych Patients Only)  Admission Status Voluntary  Psychosocial Assessment  Patient Complaints None  Eye Contact Poor  Facial Expression Anxious;Worried  Affect Anxious  Speech Logical/coherent  Interaction Childlike  Motor Activity Tremors;Slow  Appearance/Hygiene In scrubs  Behavior Characteristics Cooperative  Mood Pleasant;Sullen  Thought Process  Coherency WDL  Content WDL  Delusions None reported or observed  Perception WDL  Hallucination None reported or observed  Judgment Impaired  Confusion None  Danger to Self  Current suicidal ideation? Denies

## 2022-09-25 NOTE — Group Note (Signed)
Recreation Therapy Group Note   Group Topic:Healthy Support Systems  Group Date: 09/25/2022 Start Time: 1410 End Time: 1500 Facilitators: Rosina Lowenstein, LRT, CTRS Location:  Day room  Group Description: Straw Bridge.  Patients were given 10 plastic drinking straws and an equal length of masking tape. Using the materials provided, patients were instructed to build a free-standing bridge-like structure to suspend an everyday item (ex: deck of cards) off the floor or table surface. All materials were required to be used in Secondary school teacher. LRT facilitated post-activity discussion reviewing the importance of having strong and healthy support systems in our lives. LRT discussed how the people in our lives serve as the tape and the deck of cards we placed on top of our straw structure are the stressors we face in daily life. LRT and pts discussed what happens in our life when things get too heavy for [place], and we don't have strong supports outside of the hospital. Pt shared 2 of their healthy supports aloud in the group.    Goal Area(s) Addressed:  Patient will identify 2 healthy supports in their life. Patient will identify skills to successfully complete activity. Patient will identify correlation of this activity to life post-discharge.  Patient will work on Product manager.   Affect/Mood: N/A   Participation Level: Did not attend    Clinical Observations/Individualized Feedback: Heather Lucas did not attend group.  Plan: Continue to engage patient in RT group sessions 2-3x/week.   Rosina Lowenstein, LRT, CTRS 09/25/2022 3:39 PM

## 2022-09-25 NOTE — Plan of Care (Signed)
  Problem: Education: Goal: Knowledge of General Education information will improve Description: Including pain rating scale, medication(s)/side effects and non-pharmacologic comfort measures Outcome: Progressing   Problem: Health Behavior/Discharge Planning: Goal: Ability to manage health-related needs will improve Outcome: Progressing   Problem: Clinical Measurements: Goal: Ability to maintain clinical measurements within normal limits will improve Outcome: Progressing Goal: Diagnostic test results will improve Outcome: Progressing Goal: Cardiovascular complication will be avoided Outcome: Progressing   Problem: Activity: Goal: Risk for activity intolerance will decrease Outcome: Progressing   Problem: Nutrition: Goal: Adequate nutrition will be maintained Outcome: Progressing   Problem: Coping: Goal: Level of anxiety will decrease Outcome: Progressing   Problem: Elimination: Goal: Will not experience complications related to bowel motility Outcome: Progressing   Problem: Pain Managment: Goal: General experience of comfort will improve Outcome: Progressing   Problem: Safety: Goal: Ability to remain free from injury will improve Outcome: Progressing   Problem: Skin Integrity: Goal: Risk for impaired skin integrity will decrease Outcome: Progressing

## 2022-09-25 NOTE — Progress Notes (Signed)
Cooperative with medications, pleasant upon interaction with the  current writer, patient remains isolative to her room although mood seems to be improving. Patient is currently in bed resting.

## 2022-09-25 NOTE — Group Note (Signed)
Date:  09/25/2022 Time:  11:50 AM  Group Topic/Focus:  The cycle of anxiety  Patients identify triggers and traits that cause anxiety   Participation Level:  Did Not Attend  Participation Quality:    Affect:    Cognitive:    Insight:   Engagement in Group:    Modes of Intervention:    Additional Comments:  Did not attend  Heather Lucas Heather Lucas 09/25/2022, 11:50 AM

## 2022-09-25 NOTE — Telephone Encounter (Signed)
Kallen's brother [name] called from [place] and said that she admitted herself to [facility] in [place]. [name] is not listed on her DPR. I explained that we cannot speak to him due to HIPAA. He is working on getting her to sign a medical authorization from the hospital and fax it here. He is concerned of her condition with her severe tremors and excessive foaming of her mouth. He wants to speak to someone  about it. Told him once we get the signed authorization we will be able to speak to him. Just an FYI.

## 2022-09-26 MED ORDER — TRAZODONE HCL 50 MG PO TABS: 50.0000 mg | ORAL_TABLET | Freq: Every evening | ORAL | 0 refills | Status: DC | PRN

## 2022-09-26 MED ORDER — MIDODRINE HCL 2.5 MG PO TABS: 2.5000 mg | ORAL_TABLET | Freq: Every day | ORAL | 0 refills | Status: DC

## 2022-09-26 MED ORDER — ROSUVASTATIN CALCIUM 20 MG PO TABS: 20.0000 mg | ORAL_TABLET | Freq: Every day | ORAL | 0 refills | Status: AC

## 2022-09-26 MED ORDER — CLONAZEPAM 1 MG PO TABS: 1.0000 mg | ORAL_TABLET | Freq: Two times a day (BID) | ORAL | 0 refills | Status: DC

## 2022-09-26 MED ORDER — MIDODRINE HCL 5 MG PO TABS: 5.0000 mg | ORAL_TABLET | Freq: Every day | ORAL | 0 refills | Status: DC

## 2022-09-26 MED ORDER — HYDROXYZINE HCL 25 MG PO TABS: 25.0000 mg | ORAL_TABLET | Freq: Three times a day (TID) | ORAL | 0 refills | Status: DC | PRN

## 2022-09-26 MED ORDER — EZETIMIBE 10 MG PO TABS: 10.0000 mg | ORAL_TABLET | Freq: Every day | ORAL | 0 refills | Status: AC

## 2022-09-26 MED ORDER — CLOPIDOGREL BISULFATE 75 MG PO TABS: 75.0000 mg | ORAL_TABLET | Freq: Every day | ORAL | 0 refills | Status: AC

## 2022-09-26 MED ORDER — MIRTAZAPINE 7.5 MG PO TABS
7.5000 mg | ORAL_TABLET | Freq: Every day | ORAL | 2 refills | Status: AC
Start: 2022-09-26 — End: ?

## 2022-09-26 MED ORDER — MUPIROCIN CALCIUM 2 % EX CREA: TOPICAL_CREAM | Freq: Two times a day (BID) | CUTANEOUS | 0 refills | Status: DC

## 2022-09-26 NOTE — Plan of Care (Signed)
D: Pt alert and oriented. Pt denies experiencing any depression or anxiety. Pt denies experiencing any pain at this time. Pt denies experiencing any SI/HI, or AVH at this time.   A: Scheduled medications administered to pt, per MD orders. Support and encouragement provided. Frequent verbal contact made. Routine safety checks conducted q15 minutes.   R: No adverse drug reactions noted. Pt verbally contracts for safety at this time. Pt compliant with medications and treatment plan. Pt interacts well with others on the unit. Pt remains safe at this time. Plan of care ongoing.  Problem: Education: Goal: Knowledge of General Education information will improve Description: Including pain rating scale, medication(s)/side effects and non-pharmacologic comfort measures Outcome: Progressing   Problem: Coping: Goal: Level of anxiety will decrease Outcome: Progressing

## 2022-09-26 NOTE — Group Note (Signed)
Date:  09/26/2022 Time:  3:45 AM  Group Topic/Focus:  Healthy Communication:   The focus of this group is to discuss communication, barriers to communication, as well as healthy ways to communicate with others.    Participation Level:  Did Not Attend  Participation Quality:   Did Not Attend  Affect:   Did Not Attend  Cognitive:   Did Not Attend  Insight: None  Engagement in Group:  None  Modes of Intervention:  Socialization  Additional Comments:    Garry Heater 09/26/2022, 3:45 AM

## 2022-09-26 NOTE — Progress Notes (Signed)
D: Pt alert and oriented. Pt denies experiencing any pain, SI/HI, or AVH at this time. Pt reports she will be able to keep herself safe when she returns home. Pt has completed a suicide safety plan and was given a survey to fill out.Transition record, AVS, and SRA were reviewed and given to pt upon discharge.   A: Pt received discharge and medication education/information. Pt belongings were returned and signed for at this time to include printed prescriptions and home medications maintain in the hospital pharmacy.    R: Pt verbalized understanding of discharge and medication education/information.  Pt escorted by staff to the medical mall front lobby where pt was picked up by safe transport [employer]) and transported home.

## 2022-09-26 NOTE — Telephone Encounter (Signed)
I am currently still holding paper work, not sure what the plan is.

## 2022-09-26 NOTE — Group Note (Signed)
Date:  09/26/2022 Time:  10:48 AM  Group Topic/Focus:  Goals Group:   The focus of this group is to help patients establish daily goals to achieve during treatment and discuss how the patient can incorporate goal setting into their daily lives to aide in recovery.    Participation Level:  Did Not Attend   Heather Lucas 09/26/2022, 10:48 AM

## 2022-09-26 NOTE — Plan of Care (Signed)
  Problem: Health Behavior/Discharge Planning: Goal: Ability to manage health-related needs will improve Outcome: Not Progressing   Problem: Clinical Measurements: Goal: Ability to maintain clinical measurements within normal limits will improve Outcome: Not Progressing   

## 2022-09-26 NOTE — Discharge Summary (Addendum)
Physician Discharge Summary Note  Patient:  Heather Lucas is an 62 y.o., female MRN:  [id number] DOB:  1961-02-06 Patient phone:  [phone] (home)  Patient address:   [address] [place] [address],    Date of Admission:  09/21/2022 Date of Discharge: 09/26/2022  Reason for Admission:  Heather Lucas is a 62 y.o. female patient admitted with a past psychiatric history of MDD, GAD, insomnia, panic attacks, and tardive dyskinesia who presented voluntarily to [facility] with complaints of suicidal ideations and worsening depression.   Principal Problem: MDD (major depressive disorder) Discharge Diagnoses: Principal Problem:   MDD (major depressive disorder)   Past Psychiatric History:  Patient reports history of major depressive disorder, she denies past history of suicide attempt.  She has been admitted to this facility in June 2024.  She has been onIngrezza, BuSpar, trazodone, clonazepam, Remeron and, Trintellix in the past.   Past Medical History:  Past Medical History:  Diagnosis Date   Anxiety    Borderline hyperlipidemia    as of 10/2016: TC 134, TG 90, HDL 51, LDL 95 (PCP recently increase rosuvastatin dose from 10 to 20 mg)   Borderline hypertension    Coronary artery disease involving native coronary artery of native heart without angina pectoris 03/01/2020   [facility]:; Dr. Adin Hector:  multivessel CAD => 50% pLAD & 60% mLAD, 70% pLCx & 90% mLCx,  70% mid RCA => CABG x 4   Coronary artery disease with history of myocardial infarction without history of CABG    Depression    Family history of premature coronary artery disease 08/26/2017   Hx of CABG x 4 03/19/2020   [facility] [place], [place] -> CABG x4 (LIMA-LAD, SVG-OM1, SVG-OM2, SVG-dRCA   Hyperlipidemia due to dietary fat intake 08/26/2017    Past Surgical History:  Procedure Laterality Date   CORONARY ARTERY BYPASS GRAFT  03/2020    [facility], [place], [place] -> reportedly CABG x4   GXT/ETT  02/23/2020   From [facility]:: 9:30 min (Stage 3). 10.8 METS 80% MPHR; NO CP but + DOE. -> 1-2 mm Inf De[pression & AVR STE = c/w Ischemia   LAPAROSCOPIC CHOLECYSTECTOMY  1995   LEFT HEART CATH AND CORONARY ANGIOGRAPHY  03/01/2020   [facility]; Dr. Adin Hector:  multivessel CAD => 50% pLAD & 60% mLAD, 70% pLCx & 90% mLCx,  70% mid RCA => CABG   NM MYOVIEW LTD  02/2018   Clearly appears abnormal, referred for [facility], [place]   TRANSTHORACIC ECHOCARDIOGRAM  02/16/2020   [facility]:  a) Echo 02/16/2020: EF 60% with mild MR and TR.; b) Post Op Limited Echo: EF 55-60%.  Normal wall motion.  Normal RV.  Mild RA dilation.  No pericardial effusion.  Left pleural effusion noted.   TRANSTHORACIC ECHOCARDIOGRAM  07/2021   ([facility])  EF 65 to 70%.  No RWMA other than possible septal motion.  Mild MAC.  Aortic sclerosis.  Otherwise normal.   Family History:  Family History  Problem Relation Age of Onset   Heart attack Mother 50       Recently died; age 50.   Cancer Father    Heart attack Brother    CAD Brother    Heart attack Maternal Grandmother    Diabetes Maternal Grandmother    Colon cancer Paternal Grandmother  Other Paternal Grandfather        Circulatory problems    Social History:  Social History   Substance and Sexual Activity  Alcohol Use Not Currently     Social History   Substance and Sexual Activity  Drug Use Never    Social History   Socioeconomic History   Marital status: Divorced    Spouse name: Not on file   Number of children: Not on file   Years of education: 12   Highest education level: High school graduate  Occupational History    Employer: [employer]    Comment: Works doing Paramedic  Tobacco Use   Smoking status:  Never   Smokeless tobacco: Never  Substance and Sexual Activity   Alcohol use: Not Currently   Drug use: Never   Sexual activity: Not Currently  Other Topics Concern   Not on file  Social History Narrative   She is single.  Lives alone.    Does not routinely exercise      About 3 years ago, she moved down to [place], [place] (in the [place]) delivered with her brother after being born and raised in [place].   Social Determinants of Health   Financial Resource Strain: Not on file  Food Insecurity: No Food Insecurity (09/21/2022)   Hunger Vital Sign    Worried About Running Out of Food in the Last Year: Never true    Ran Out of Food in the Last Year: Never true  Transportation Needs: No Transportation Needs (09/21/2022)   PRAPARE - Administrator, Civil Service (Medical): No    Lack of Transportation (Non-Medical): No  Physical Activity: High Risk (04/15/2019)   Received from [facility]   Physical Activity    How often do you engage in moderate physical activity for 30 minutes or more?: Never    How often do you engage in vigorous physical activity for 20 minutes or more?: Never    How many hours per day do you spend sitting?: More than 8 hours  Stress: Low Risk  (03/06/2021)   Received from [facility]   Stress    Stress in your Life: Not on file    Dealing with Stress: Not on file  Social Connections: Not on file    Hospital Course:  The patient was admitted to Inpatient psychiatric treatment for stabilization of depression and suicidal thoughts. Patient was placed on suicidal precautions. The patient was evaluated and treated by the multidisciplinary treatment team including physicians, nurses, social workers and therapists. All medications were presented to the patient and the Patient gave consent to all the medications that they were given, as well as was explained the risks, benefits, side effects and alternatives of all medication therapies. The  patient was integrated into the general milieu on the ward and encouraged to attend to her ADLs and participate in all groups and activities. During hospital course the Patient attended coping skill groups, music therapy and activity therapy groups. Patient was counseled on cognitive techniques/skills by multiple staff members and given support care by the staff.  Patient's medication regimen was evaluated and titrated to therapeutic levels to better Patient's overall daily functioning. Specifically, the patient was started on home medicines including Remeron and Klonopin.  She tolerated the medication well with no significant side effects.  Patient was stressed about involuntary movement, Neurology was consulted.    Impression per neurologist: "62 year old female with abnormal movements that started following neuroleptic use.  There is no  definite increased tone to suggest parkinsonism, but the tremor is more prominent with rest.  Tardive tremor is a possibility.  She has had no improvement with Ingrezza, and therefore I think would be reasonable to try Cogentin to see if she gets some improvement with this medication.  There is some question of distractibility, and therefore psychogenic tremor might need to be considered, but I would favor treating it as EPS at this time"  Patient was started on Cogentin for involuntary movement. Patient reported she has an appointment with outpatient neurologist in October.  During the hospitalization, the patient demonstrated a stabilization of mood and depression with improved sleep and appetite. At the time of discharge, the patient denied any suicidal ideation/homicidal ideation and was not overtly depressed, manic or psychotic. The Patient was interacting well in groups and on the unit with their peers. Patient was able to identify a safety plan to include speaking with family, contacting outpatient provider or calling 911 if hallucinations/delusions returned or  worsened or thoughts of self-harm or suicide return. Patient was counselled on outpatient follow-up that was arranged prior to discharge.  Physical Findings:  Musculoskeletal: Strength & Muscle Tone: within normal limits Gait & Station: normal Patient leans: N/A   Psychiatric Specialty Exam:   Presentation  General Appearance:  Appropriate for Environment   Eye Contact: Fair   Speech: Clear and Coherent   Speech Volume: Improved, soft   Handedness: Right     Mood and Affect  Mood: Better   Affect: Congruent;Decreased tremors in both upper extremities, facio oral involuntary movements)     Thought Process  Thought Processes: Coherent; Goal Directed   Descriptions of Associations:Intact   Orientation:Full (Time, Place and Person)   Thought Content:Abstract Reasoning; Logical   History of Schizophrenia/Schizoaffective disorder:No   Hallucinations:Hallucinations: None   Ideas of Reference:None   Suicidal Thoughts:Suicidal Thoughts: Denies, no intentions or plans   Homicidal Thoughts:Homicidal Thoughts: No     Sensorium  Memory: Immediate Fair; Recent Fair   Judgment: Impaired   Insight: Fair     Chartered certified accountant: Fair   Attention Span: Fair   Recall: [employer] of Knowledge: Fair   Language: Fair     Psychomotor Activity  Psychomotor Activity: Psychomotor Activity: Extrapyramidal Side Effects (EPS); Extrapyramidal Side Effects (EPS): Tardive Dyskinesia     Assets  Assets: Desire for Improvement; Housing     Sleep  Sleep: Sleep: Improved       Physical Exam: Physical Exam Constitutional:      Appearance: Normal appearance.  HENT:     Head: Normocephalic and atraumatic.     Nose: Nose normal.  Eyes:     Pupils: Pupils are equal, round, and reactive to light.  Cardiovascular:     Rate and Rhythm: Normal rate.     Pulses: Normal pulses.  Pulmonary:     Effort: Pulmonary effort is normal.   Skin:    General: Skin is warm.  Neurological:     General: No focal deficit present.     Mental Status: She is alert and oriented to person, place, and time.      Review of Systems  Constitutional:  Negative for chills and fever.  HENT:  Negative for congestion and hearing loss.   Eyes:  Negative for blurred vision and double vision.  Respiratory:  Negative for cough and shortness of breath.   Cardiovascular:  Negative for chest pain and palpitations.  Gastrointestinal:  Negative for nausea and vomiting.  Musculoskeletal:  Positive for joint pain.  Neurological:  Positive for tremors. Negative for dizziness and focal weakness.     Social History   Tobacco Use  Smoking Status Never  Smokeless Tobacco Never   Tobacco Cessation:  N/A, patient does not currently use tobacco products   Blood Alcohol level:  Lab Results  Component Value Date   ETH <10 09/20/2022   ETH <10 07/16/2022    Metabolic Disorder Labs:  Lab Results  Component Value Date   HGBA1C 5.0 07/16/2022   MPG 96.8 07/16/2022   Lab Results  Component Value Date   PROLACTIN 13.4 07/16/2022   Lab Results  Component Value Date   CHOL 112 07/16/2022   TRIG 96 07/16/2022   HDL 44 07/16/2022   CHOLHDL 2.5 07/16/2022   VLDL 19 07/16/2022   LDLCALC 49 07/16/2022   LDLCALC 49 03/03/2022    See Psychiatric Specialty Exam and Suicide Risk Assessment completed by Attending Physician prior to discharge.  Discharge destination:  Home  Is patient on multiple antipsychotic therapies at discharge:  No    Recommended Plan for Multiple Antipsychotic Therapies: NA   Allergies as of 09/26/2022   Not on File      Medication List     STOP taking these medications    amoxicillin-clavulanate 875-125 MG tablet Commonly known as: AUGMENTIN   LORazepam 0.5 MG tablet Commonly known as: Ativan   potassium chloride 10 MEQ tablet Commonly known as: KLOR-CON       TAKE these medications       Indication  clonazePAM 1 MG tablet Commonly known as: KLONOPIN Take 1 tablet (1 mg total) by mouth 2 (two) times daily. What changed:  how much to take how to take this when to take this additional instructions    clopidogrel 75 MG tablet Commonly known as: PLAVIX Take 1 tablet (75 mg total) by mouth daily. Start taking on: September 27, 2022    ezetimibe 10 MG tablet Commonly known as: ZETIA Take 1 tablet (10 mg total) by mouth daily.  Indication: High Amount of Fats in the Blood   hydrOXYzine 25 MG tablet Commonly known as: ATARAX Take 1 tablet (25 mg total) by mouth 3 (three) times daily as needed for anxiety.    midodrine 2.5 MG tablet Commonly known as: PROAMATINE Take 1 tablet (2.5 mg total) by mouth daily before supper. What changed:  medication strength how much to take when to take this    midodrine 5 MG tablet Commonly known as: PROAMATINE Take 1 tablet (5 mg total) by mouth daily before breakfast. Start taking on: September 27, 2022 What changed: You were already taking a medication with the same name, and this prescription was added. Make sure you understand how and when to take each.    mirtazapine 7.5 MG tablet Commonly known as: REMERON Take 1 tablet (7.5 mg total) by mouth at bedtime.    mupirocin cream 2 % Commonly known as: BACTROBAN Apply topically 2 (two) times daily.    mupirocin ointment 2 % Commonly known as: BACTROBAN Apply 1 Application topically 2 (two) times daily.    rosuvastatin 20 MG tablet Commonly known as: CRESTOR Take 1 tablet (20 mg total) by mouth daily.  Indication: High Amount of Fats in the Blood   traZODone 50 MG tablet Commonly known as: DESYREL Take 1 tablet (50 mg total) by mouth at bedtime as needed for sleep.  Indication: Trouble Sleeping   Trintellix 5 MG Tabs tablet Generic drug:  vortioxetine HBr TAKE 1 TABLET ONCE A DAY.         COGENTIN called in to [facility] city pharmacy in [place] 1 mg po BID #60, zero  refills   PATIENTS CONDITION AT DISCHARGE:  Stable  TOBACCO CESSATION SCREENING  Patient was screened and counselled on smoking cessation at time of discharge.   PRESCRIPTION ARE LOCATED:  On Chart  DISCHARGE INSTRUCTIONS:  1. Diet: Cardiac  2. Activity: As tolerated  3. Take medications as prescribed and not to make any changes without first consulting with the outpatient provider.  4. Patient was advised to avoid any illicit drugs or alcohol due to negative impact on physical and mental health.  5. Patient should keep all follow up appointments.  TIME SPENT ON DISCHARGE: Over 35 minutes were spent on this patients discharge including a face to face encounter, patient counseling and preparation of discharge materials.   Signed: Lewanda Rife, MD

## 2022-09-26 NOTE — BHH Counselor (Signed)
CSW contacted [facility] [phone] to confirm pts next appointment per her request.   [facility] states they cannot confirm pt's appointment because the clinic is closed today and the call center is unable to provide that information.  Pt verbalizes that she does have another appointment.   CSW informed pt that they should call tomorrow to see when their appointment is, as the call center states [facility] is closed today but will be open tomorrow 5:00 AM - 10:00 AM .   Reynaldo Minium, MSW, [facility] 09/26/2022 11:57 AM

## 2022-09-26 NOTE — Progress Notes (Signed)
  [facility] Adult Case Management Discharge Plan :  Will you be returning to the same living situation after discharge:  Yes,  pt will be returning home  At discharge, do you have transportation home?: Yes,  CSW will provide taxi voucher  Do you have the ability to pay for your medications: Yes,  [employer]UE [employer] / BCBS COMM PPO  Release of information consent forms completed and in the chart;  Patient's signature needed at discharge.  Patient to Follow up at:  Follow-up Information     [facility]. Call.   Why: As we discussed, I was unable to confirm your scheduled appointment. You are able to call [facility] at [phone], and press option 3. You may wait for a representative who will be able to transfer you to the [place] office. They are closed 09/26/22, but open tomorrow 09/27/2022 from 5:00 AM to 10:00 AM. Contact information: [address] [place] [address]        [facility]. Go to.   Specialty: Urgent Care Why: As always, if you feel you need urgent mental health resource please go to the Behavioral Health Urgent Care. Open 24/7, No appointment required. Contact information: [address] [place] [address] [phone]                Next level of care provider has access to [facility] Link:no  Safety Planning and Suicide Prevention discussed: Yes,  Pt declined, CSW went over safety planning and suicide prevention education with pt.      Has patient been referred to the Quitline?: Patient does not use tobacco/nicotine products  Patient has been referred for addiction treatment: No known substance use disorder.  [address], LCSWA 09/26/2022, 12:04 PM

## 2022-09-26 NOTE — BHH Suicide Risk Assessment (Signed)
[facility] Discharge Suicide Risk Assessment   Principal Problem: MDD (major depressive disorder) Discharge Diagnoses: Principal Problem:   MDD (major depressive disorder)    Musculoskeletal: Strength & Muscle Tone: within normal limits Gait & Station: normal Patient leans: N/A   Psychiatric Specialty Exam:   Presentation  General Appearance:  Appropriate for Environment   Eye Contact: Fair   Speech: Clear and Coherent   Speech Volume: Improved, soft   Handedness: Right     Mood and Affect  Mood: Better   Affect: Congruent;Decreased tremors in both upper extremities, facio oral involuntary movements)     Thought Process  Thought Processes: Coherent; Goal Directed   Descriptions of Associations:Intact   Orientation:Full (Time, Place and Person)   Thought Content:Abstract Reasoning; Logical   History of Schizophrenia/Schizoaffective disorder:No   Hallucinations:Hallucinations: None   Ideas of Reference:None   Suicidal Thoughts:Suicidal Thoughts: Denies, no intentions or plans   Homicidal Thoughts:Homicidal Thoughts: No     Sensorium  Memory: Immediate Fair; Recent Fair   Judgment: Impaired   Insight: Fair     Chartered certified accountant: Fair   Attention Span: Fair   Recall: [employer] of Knowledge: Fair   Language: Fair     Psychomotor Activity  Psychomotor Activity: Psychomotor Activity: Extrapyramidal Side Effects (EPS); Extrapyramidal Side Effects (EPS): Tardive Dyskinesia     Assets  Assets: Desire for Improvement; Housing     Sleep  Sleep: Sleep: Improving       Physical Exam: Physical Exam Constitutional:      Appearance: Normal appearance.  HENT:     Head: Normocephalic and atraumatic.     Nose: Nose normal.  Eyes:     Pupils: Pupils are equal, round, and reactive to light.  Cardiovascular:     Rate and Rhythm: Normal rate.     Pulses: Normal pulses.  Pulmonary:     Effort: Pulmonary effort  is normal.  Skin:    General: Skin is warm.  Neurological:     General: No focal deficit present.     Mental Status: She is alert and oriented to person, place, and time.      Review of Systems  Constitutional:  Negative for chills and fever.  HENT:  Negative for congestion and hearing loss.   Eyes:  Negative for blurred vision and double vision.  Respiratory:  Negative for cough and shortness of breath.   Cardiovascular:  Negative for chest pain and palpitations.  Gastrointestinal:  Negative for nausea and vomiting.  Musculoskeletal:  Positive for joint pain.  Neurological:  Positive for tremors. Negative for dizziness and focal weakness.    Blood pressure (!) 96/51, pulse (!) 58, temperature (!) 97.2 F (36.2 C), resp. rate 18, height 5\' 2"  (1.575 m), weight 49.2 kg, SpO2 98%. Body mass index is 19.84 kg/m.   Demographic Factors:  Caucasian and Living alone  Loss Factors: Decrease in vocational status   Risk Reduction Factors:   Positive therapeutic relationship and Positive coping skills or problem solving skills  Continued Clinical Symptoms:  Previous Psychiatric Diagnoses and Treatments  Cognitive Features That Contribute To Risk:  Thought constriction (tunnel vision)    Suicide Risk:  Minimal: No identifiable suicidal ideation.      Plan Of Care/Follow-up recommendations:  Per Discharge Summary  Lewanda Rife, MD 09/26/2022, 11:02 AM

## 2022-09-26 NOTE — Progress Notes (Signed)
[facility] MD Progress Note  09/26/2022  Heather Lucas  MRN:  [id number]  Subjective: Chart reviewed, case discussed with staff.  Patient seen during rounds.  Patient reports she feels better today.  Reports Cogentin has helped with involuntary movements.  She denies any thoughts of harming herself or others.  She reports her sleep and appetite has improved.  Patient was provided with support and reassurance.   Principal Problem: MDD (major depressive disorder) Diagnosis: Principal Problem:   MDD (major depressive disorder)   Past Medical History:  Past Medical History:  Diagnosis Date   Anxiety    Borderline hyperlipidemia    as of 10/2016: TC 134, TG 90, HDL 51, LDL 95 (PCP recently increase rosuvastatin dose from 10 to 20 mg)   Borderline hypertension    Coronary artery disease involving native coronary artery of native heart without angina pectoris 03/01/2020   [facility]:; Dr. Adin Hector:  multivessel CAD => 50% pLAD & 60% mLAD, 70% pLCx & 90% mLCx,  70% mid RCA => CABG x 4   Coronary artery disease with history of myocardial infarction without history of CABG    Depression    Family history of premature coronary artery disease 08/26/2017   Hx of CABG x 4 03/19/2020   [facility], [place], [place] -> CABG x4 (LIMA-LAD, SVG-OM1, SVG-OM2, SVG-dRCA   Hyperlipidemia due to dietary fat intake 08/26/2017    Past Surgical History:  Procedure Laterality Date   CORONARY ARTERY BYPASS GRAFT  03/2020   [facility], [place], [place] -> reportedly CABG x4   GXT/ETT  02/23/2020   From [facility]:: 9:30 min (Stage 3). 10.8 METS 80% MPHR; NO CP but + DOE. -> 1-2 mm Inf De[pression & AVR STE = c/w Ischemia   LAPAROSCOPIC CHOLECYSTECTOMY  1995   LEFT HEART CATH AND CORONARY ANGIOGRAPHY  03/01/2020   [facility]; Dr. Adin Hector:   multivessel CAD => 50% pLAD & 60% mLAD, 70% pLCx & 90% mLCx,  70% mid RCA => CABG   NM MYOVIEW LTD  02/2018   Clearly appears abnormal, referred for [facility], [place]   TRANSTHORACIC ECHOCARDIOGRAM  02/16/2020   [facility] ([facility]:  a) Echo 02/16/2020: EF 60% with mild MR and TR.; b) Post Op Limited Echo: EF 55-60%.  Normal wall motion.  Normal RV.  Mild RA dilation.  No pericardial effusion.  Left pleural effusion noted.   TRANSTHORACIC ECHOCARDIOGRAM  07/2021   ([facility])  EF 65 to 70%.  No RWMA other than possible septal motion.  Mild MAC.  Aortic sclerosis.  Otherwise normal.   Family History:  Family History  Problem Relation Age of Onset   Heart attack Mother 88       Recently died; age 65.   Cancer Father    Heart attack Brother    CAD Brother    Heart attack Maternal Grandmother    Diabetes Maternal Grandmother    Colon cancer Paternal Grandmother    Other Paternal Grandfather        Circulatory problems    Social History:  Social History   Substance and Sexual Activity  Alcohol Use Not Currently     Social History   Substance and Sexual Activity  Drug Use Never    Social History   Socioeconomic History   Marital status: Divorced    Spouse name: Not on  file   Number of children: Not on file   Years of education: 12   Highest education level: High school graduate  Occupational History    Employer: [employer]    Comment: Works doing Paramedic  Tobacco Use   Smoking status: Never   Smokeless tobacco: Never  Substance and Sexual Activity   Alcohol use: Not Currently   Drug use: Never   Sexual activity: Not Currently  Other Topics Concern   Not on file  Social History Narrative   She is single.  Lives alone.    Does not routinely exercise      About 3 years ago, she moved down to [place], [place] (in the [place]) delivered with her brother after being born and raised in [place].   Social  Determinants of Health   Financial Resource Strain: Not on file  Food Insecurity: No Food Insecurity (09/21/2022)   Hunger Vital Sign    Worried About Running Out of Food in the Last Year: Never true    Ran Out of Food in the Last Year: Never true  Transportation Needs: No Transportation Needs (09/21/2022)   PRAPARE - Administrator, Civil Service (Medical): No    Lack of Transportation (Non-Medical): No  Physical Activity: High Risk (04/15/2019)   Received from [facility]   Physical Activity    How often do you engage in moderate physical activity for 30 minutes or more?: Never    How often do you engage in vigorous physical activity for 20 minutes or more?: Never    How many hours per day do you spend sitting?: More than 8 hours  Stress: Low Risk  (03/06/2021)   Received from [facility]   Stress    Stress in your Life: Not on file    Dealing with Stress: Not on file  Social Connections: Not on file   Additional Social History:                         Sleep: Fair  Appetite:  Poor  Current Medications: Current Facility-Administered Medications  Medication Dose Route Frequency Provider Last Rate Last Admin   acetaminophen (TYLENOL) tablet 650 mg  650 mg Oral Q6H PRN Motley-Mangrum, Jadeka A, PMHNP       alum & mag hydroxide-simeth (MAALOX/MYLANTA) 200-200-20 MG/5ML suspension 30 mL  30 mL Oral Q4H PRN Motley-Mangrum, Jadeka A, PMHNP       benztropine (COGENTIN) tablet 1 mg  1 mg Oral BID Lewanda Rife, MD   1 mg at 09/26/22 1041   clonazePAM (KLONOPIN) tablet 1 mg  1 mg Oral BID Lewanda Rife, MD   1 mg at 09/26/22 1041   clopidogrel (PLAVIX) tablet 75 mg  75 mg Oral Daily Motley-Mangrum, Jadeka A, PMHNP   75 mg at 09/26/22 1040   diphenhydrAMINE (BENADRYL) capsule 50 mg  50 mg Oral TID PRN Motley-Mangrum, Jadeka A, PMHNP       Or   diphenhydrAMINE (BENADRYL) injection 50 mg  50 mg Intramuscular TID PRN Motley-Mangrum, Jadeka A, PMHNP        ezetimibe (ZETIA) tablet 10 mg  10 mg Oral Daily Motley-Mangrum, Jadeka A, PMHNP   10 mg at 09/26/22 1041   haloperidol (HALDOL) tablet 5 mg  5 mg Oral TID PRN Motley-Mangrum, Jadeka A, PMHNP       Or   haloperidol lactate (HALDOL) injection 5 mg  5 mg Intramuscular TID PRN Motley-Mangrum, Ezra Sites, PMHNP  hydrOXYzine (ATARAX) tablet 25 mg  25 mg Oral TID PRN Motley-Mangrum, Geralynn Ochs A, PMHNP   25 mg at 09/23/22 1734   LORazepam (ATIVAN) tablet 2 mg  2 mg Oral TID PRN Motley-Mangrum, Geralynn Ochs A, PMHNP       Or   LORazepam (ATIVAN) injection 2 mg  2 mg Intramuscular TID PRN Motley-Mangrum, Jadeka A, PMHNP       magnesium hydroxide (MILK OF MAGNESIA) suspension 30 mL  30 mL Oral Daily PRN Motley-Mangrum, Jadeka A, PMHNP   30 mL at 09/24/22 1027   midodrine (PROAMATINE) tablet 2.5 mg  2.5 mg Oral QAC supper Drusilla Kanner, RPH   2.5 mg at 09/25/22 1716   midodrine (PROAMATINE) tablet 5 mg  5 mg Oral QAC breakfast Motley-Mangrum, Jadeka A, PMHNP   5 mg at 09/26/22 0825   mirtazapine (REMERON) tablet 7.5 mg  7.5 mg Oral QHS Motley-Mangrum, Jadeka A, PMHNP   7.5 mg at 09/25/22 2123   mupirocin cream (BACTROBAN) 2 %   Topical BID Lewanda Rife, MD   Given at 09/26/22 1041   rosuvastatin (CRESTOR) tablet 20 mg  20 mg Oral Daily Motley-Mangrum, Jadeka A, PMHNP   20 mg at 09/26/22 1041   traZODone (DESYREL) tablet 50 mg  50 mg Oral QHS PRN Lewanda Rife, MD   50 mg at 09/24/22 2105    Lab Results: No results found for this or any previous visit (from the past 48 hour(s)).  Blood Alcohol level:  Lab Results  Component Value Date   ETH <10 09/20/2022   ETH <10 07/16/2022    Metabolic Disorder Labs: Lab Results  Component Value Date   HGBA1C 5.0 07/16/2022   MPG 96.8 07/16/2022   Lab Results  Component Value Date   PROLACTIN 13.4 07/16/2022   Lab Results  Component Value Date   CHOL 112 07/16/2022   TRIG 96 07/16/2022   HDL 44 07/16/2022   CHOLHDL 2.5 07/16/2022   VLDL 19  07/16/2022   LDLCALC 49 07/16/2022   LDLCALC 49 03/03/2022     Musculoskeletal: Strength & Muscle Tone: within normal limits Gait & Station: normal Patient leans: N/A   Psychiatric Specialty Exam:   Presentation  General Appearance:  Appropriate for Environment   Eye Contact: Fair   Speech: Clear and Coherent   Speech Volume: Improved, soft   Handedness: Right     Mood and Affect  Mood: Better   Affect: Congruent;Decreased tremors in both upper extremities, facio oral involuntary movements)     Thought Process  Thought Processes: Coherent; Goal Directed   Descriptions of Associations:Intact   Orientation:Full (Time, Place and Person)   Thought Content:Abstract Reasoning; Logical   History of Schizophrenia/Schizoaffective disorder:No   Hallucinations:Hallucinations: None   Ideas of Reference:None   Suicidal Thoughts:Suicidal Thoughts: Denies   Homicidal Thoughts:Homicidal Thoughts: No     Sensorium  Memory: Immediate Fair; Recent Fair   Judgment: Impaired   Insight: Fair     Chartered certified accountant: Fair   Attention Span: Fair   Recall: [employer] of Knowledge: Fair   Language: Fair     Psychomotor Activity  Psychomotor Activity: Psychomotor Activity: Extrapyramidal Side Effects (EPS); Restlessness Extrapyramidal Side Effects (EPS): Tardive Dyskinesia     Assets  Assets: Desire for Improvement; Housing     Sleep  Sleep: Sleep: Improving       Physical Exam: Physical Exam Constitutional:      Appearance: Normal appearance.  HENT:     Head: Normocephalic  and atraumatic.     Nose: Nose normal.  Eyes:     Pupils: Pupils are equal, round, and reactive to light.  Cardiovascular:     Rate and Rhythm: Normal rate.     Pulses: Normal pulses.  Pulmonary:     Effort: Pulmonary effort is normal.  Skin:    General: Skin is warm.  Neurological:     General: No focal deficit present.     Mental  Status: She is alert and oriented to person, place, and time.      Review of Systems  Constitutional:  Negative for chills and fever.  HENT:  Negative for congestion and hearing loss.   Eyes:  Negative for blurred vision and double vision.  Respiratory:  Negative for cough and shortness of breath.   Cardiovascular:  Negative for chest pain and palpitations.  Gastrointestinal:  Negative for nausea and vomiting.  Musculoskeletal:  Positive for joint pain.  Neurological:  Positive for tremors. Negative for dizziness and focal weakness.    Blood pressure (!) 96/51, pulse (!) 58, temperature (!) 97.2 F (36.2 C), resp. rate 18, height 5\' 2"  (1.575 m), weight 49.2 kg, SpO2 98%. Body mass index is 19.84 kg/m.   Treatment Plan Summary: Daily contact with patient to assess and evaluate symptoms and progress in treatment and Medication management  Patient is admitted to locked unit under safety precautions We will start patient on home medication including Remeron 7.5 mg at bedtime for depression discontinued amantadine 100 mg p.o. daily, patient reports her symptoms are worse after taking amantadine  Appreciate neurology consult and recommendation, patient has been started on Cogentin Social worker consulted to get collateral and help withdischarge plan   Lewanda Rife, MD

## 2022-09-30 ENCOUNTER — Encounter: Payer: Self-pay | Admitting: Adult Health

## 2022-09-30 ENCOUNTER — Ambulatory Visit: Payer: BC Managed Care – PPO | Admitting: Adult Health

## 2022-09-30 DIAGNOSIS — G2401 Drug induced subacute dyskinesia: Secondary | ICD-10-CM

## 2022-09-30 DIAGNOSIS — F410 Panic disorder [episodic paroxysmal anxiety] without agoraphobia: Secondary | ICD-10-CM

## 2022-09-30 DIAGNOSIS — F331 Major depressive disorder, recurrent, moderate: Secondary | ICD-10-CM | POA: Diagnosis not present

## 2022-09-30 DIAGNOSIS — F411 Generalized anxiety disorder: Secondary | ICD-10-CM

## 2022-09-30 NOTE — Progress Notes (Signed)
Heather Lucas [id number] [birth year] 62 y.o.  Subjective:   Patient ID:  Heather Lucas is a 62 y.o. (DOB Oct 03, 1960) female.  Chief Complaint: No chief complaint on file.   HPI Heather Lucas presents to the office today for follow-up of MDD, GAD, panic attacks and TD.  08/02/2021 - initial evaluation. Reports she moved back to [place] from [place] where she lived for 2 and 1/2 years. Now living alone in [place]. Has friends in the area and has adjusted to moving back to the area. While living in [place] was seen by a telemedicine provider and was started on Abilify 20mg  and Olanzapine 7.5mg . She was also taking Trintellix when she initially moved to [place] and had Xanax as needed. Reports feeling more anxious and a little depressed after the move. Mostly staying home - works from home and does not get out much. Felt like she was having some adjustment issues and reached out to a tele health company for assistance. Her medications at initial visit were: Lexapro 10mg  daily, Abilify 10mg  daily,Zyprexa 7.5mg  at hs and Xanax 0.5mg  daily. Reports TD and akathisia with that medication regimen and felt very uncomfortable. Reports peri oral mouth movements, legs shaking, hands shaking, left hand shaking started all which started after initiating antipsychotic medications.  Reports recent hospitalization for mood stabilization and TD symptoms.  Accompanied by a friend/church member.  Describes mood today as "better". Pleasant. Denies tearfulness. Mood symptoms - denies depression and irritability. Reports anxiety. Denies panic attacks. Reports decreased worry, rumination, and over thinking. Mood is more consistent. Stating "I feel like I'm doing better". Recently hospitalized for 5 days for mood symptoms associated with TD. Was started on Cogentin through neurology and has a follow up in October. Feels like current medication changes are helpful. She does not feel able to return to the work  setting. She is receiving STD benefits through employer - although she reports a recent termination. Reports decreased interest and motivation. Taking medications as prescribed.  Energy levels low. Active, does not have a regular exercise routine. Enjoys some usual interests and activities. Single. Lives alone. Brother in [place]. Friends local. Attends church. Appetite adequate - "fair". Weight loss - 108 pounds - 61". Sleeping better at night Averages 10 hours.   Focus and concentration difficulties - unable to work with TD and akthesia symptoms. Completing minimal tasks. Managing some aspects of household. 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  Previous medication trials:  Zoloft, Prozac, Wellbutrin, Lexapro, Trintellix, Hydroxyzine.  AIMS    Flowsheet Row Admission (Discharged) from 09/21/2022 in [facility] BE[facility]AL [facility] ED from 09/20/2022 in [facility] Emergency Department at [facility] Office Visit from 12/13/2021 in [facility]  AIMS Total Score 14 14 28       AUDIT    Flowsheet Row Admission (Discharged) from 09/21/2022 in [facility] BE[facility]  Alcohol Use Disorder Identification Test Final Score (AUDIT) 0      Mini-Mental    Flowsheet Row Office Visit from 07/30/2022 in [facility]  Total Score (max 30 points ) 30      Flowsheet Row Admission (Discharged) from 09/21/2022 in [facility] BEHAVIORAL MEDICINE ED from 09/20/2022 in [facility] Emergency Department at [facility] ED from 09/18/2022 in [facility] Emergency Department at [facility]  C-SSRS RISK CATEGORY Moderate Risk Moderate Risk No Risk        Review of Systems:  Review of Systems  Musculoskeletal:  Negative for gait problem.  Neurological:  Negative for tremors.  Psychiatric/Behavioral:         Please refer to HPI    Medications: I have reviewed the patient's  current medications.  Current Outpatient Medications  Medication Sig Dispense Refill   clonazePAM (KLONOPIN) 1 MG tablet Take 1 tablet (1 mg total) by mouth 2 (two) times daily. 30 tablet 0   clopidogrel (PLAVIX) 75 MG tablet Take 1 tablet (75 mg total) by mouth daily. 30 tablet 0   ezetimibe (ZETIA) 10 MG tablet Take 1 tablet (10 mg total) by mouth daily. 30 tablet 0   hydrOXYzine (ATARAX) 25 MG tablet Take 1 tablet (25 mg total) by mouth 3 (three) times daily as needed for anxiety. 30 tablet 0   midodrine (PROAMATINE) 2.5 MG tablet Take 1 tablet (2.5 mg total) by mouth daily before supper. 30 tablet 0   midodrine (PROAMATINE) 5 MG tablet Take 1 tablet (5 mg total) by mouth daily before breakfast. 30 tablet 0   mirtazapine (REMERON) 7.5 MG tablet Take 1 tablet (7.5 mg total) by mouth at bedtime. 30 tablet 2   mupirocin cream (BACTROBAN) 2 % Apply topically 2 (two) times daily. 15 g 0   mupirocin ointment (BACTROBAN) 2 % Apply 1 Application topically 2 (two) times daily.     rosuvastatin (CRESTOR) 20 MG tablet Take 1 tablet (20 mg total) by mouth daily. 30 tablet 0   traZODone (DESYREL) 50 MG tablet Take 1 tablet (50 mg total) by mouth at bedtime as needed for sleep. 30 tablet 0   TRINTELLIX 5 MG TABS tablet TAKE 1 TABLET ONCE A DAY. 30 tablet 0   No current facility-administered medications for this visit.    Medication Side Effects: None  Allergies: Not on File  Past Medical History:  Diagnosis Date   Anxiety    Borderline hyperlipidemia    as of 10/2016: TC 134, TG 90, HDL 51, LDL 95 (PCP recently increase rosuvastatin dose from 10 to 20 mg)   Borderline hypertension    Coronary artery disease involving native coronary artery of native heart without angina pectoris 03/01/2020   [facility]:; Dr. Adin Hector:  multivessel CAD => 50% pLAD & 60% mLAD, 70% pLCx & 90% mLCx,  70% mid RCA => CABG x 4   Coronary artery disease with history of  myocardial infarction without history of CABG    Depression    Family history of premature coronary artery disease 08/26/2017   Hx of CABG x 4 03/19/2020   [facility], [place], [place] -> CABG x4 (LIMA-LAD, SVG-OM1, SVG-OM2, SVG-dRCA   Hyperlipidemia due to dietary fat intake 08/26/2017    Past Medical History, Surgical history, Social history, and Family history were reviewed and updated as appropriate.   Please see review of systems for further details on the patient's review from today.   Objective:   Physical Exam:  There were no vitals taken for this visit.  Physical Exam Constitutional:      General: She is not in acute distress. Musculoskeletal:        General: No deformity.  Neurological:     Mental Status: She is alert and oriented to person, place, and time.     Coordination: Coordination normal.  Psychiatric:        Attention and Perception: Attention and perception normal. She does not perceive auditory or visual hallucinations.        Mood and Affect:  Affect is not labile, blunt, angry or inappropriate.        Speech: Speech normal.        Behavior: Behavior normal.        Thought Content: Thought content normal. Thought content is not paranoid or delusional. Thought content does not include homicidal or suicidal ideation. Thought content does not include homicidal or suicidal plan.        Cognition and Memory: Cognition and memory normal.        Judgment: Judgment normal.     Comments: Insight intact     Lab Review:     Component Value Date/Time   NA 141 09/20/2022 1545   NA 141 10/16/2017 1412   K 3.5 09/20/2022 1545   CL 105 09/20/2022 1545   CO2 22 09/20/2022 1545   GLUCOSE 99 09/20/2022 1545   BUN 12 09/20/2022 1545   BUN 14 10/16/2017 1412   CREATININE 0.71 09/20/2022 1545   CALCIUM 9.4 09/20/2022 1545   PROT 7.7 09/20/2022 1545   PROT 6.0 03/03/2022 0833   ALBUMIN 4.6 09/20/2022 1545   ALBUMIN 4.1 03/03/2022 0833   AST 30  09/20/2022 1545   ALT 21 09/20/2022 1545   ALKPHOS 69 09/20/2022 1545   BILITOT 0.7 09/20/2022 1545   BILITOT 0.5 03/03/2022 0833   GFRNONAA >60 09/20/2022 1545   GFRAA 95 10/16/2017 1412       Component Value Date/Time   WBC 11.2 (H) 09/20/2022 1545   RBC 4.41 09/20/2022 1545   HGB 13.6 09/20/2022 1545   HCT 40.8 09/20/2022 1545   PLT 209 09/20/2022 1545   MCV 92.5 09/20/2022 1545   MCH 30.8 09/20/2022 1545   MCHC 33.3 09/20/2022 1545   RDW 11.7 09/20/2022 1545   LYMPHSABS 0.9 09/18/2022 1902   MONOABS 0.7 09/18/2022 1902   EOSABS 0.0 09/18/2022 1902   BASOSABS 0.0 09/18/2022 1902    No results found for: "POCLITH", "LITHIUM"   No results found for: "PHENYTOIN", "PHENOBARB", "VALPROATE", "CBMZ"   .res Assessment: Plan:    Plan:  PDMP reviewed  Trintellix 5mg  daily - depression/anxiety Trazadone 50mg  at hs - sleep Cogentin 1mg  BID - movement - TD Clonazepam 1mg  BID - anxiety Hydroxyzine 25mg  TID - sleep/anxiety Remeron 7.5 MG tablet - depression/anxiety  Also spoke with patient's brother regarding recent hospital stay, medications and treatment plans.  Patient totally disabled and unable to work 09/30/2022 through 11/10/2022. Will reassess a return to work date on on 11/10/2022.  RTC 1 week.  Patient advised to contact office with any questions, adverse effects, or acute worsening in signs and symptoms.   Discussed potential benefits, risk, and side effects of benzodiazepines to include potential risk of tolerance and dependence, as well as possible drowsiness.  Advised patient not to drive if experiencing drowsiness and to take lowest possible effective dose to minimize risk of dependence and tolerance.    Diagnoses and all orders for this visit:  Major depressive disorder, recurrent episode, moderate (HCC)  Generalized anxiety disorder  Tardive dyskinesia  Panic attacks     Please see After Visit Summary for patient specific instructions.  Future  Appointments  Date Time Provider Department Center  10/09/2022 11:20 AM Kenika Sahm, Thereasa Solo, NP [facility] None  11/17/2022  2:30 PM Levert Feinstein, MD [facility] None    No orders of the defined types were placed in this encounter.   -------------------------------

## 2022-10-06 DIAGNOSIS — G2401 Drug induced subacute dyskinesia: Secondary | ICD-10-CM | POA: Diagnosis not present

## 2022-10-07 ENCOUNTER — Encounter: Payer: Self-pay | Admitting: Neurology

## 2022-10-07 ENCOUNTER — Ambulatory Visit (INDEPENDENT_AMBULATORY_CARE_PROVIDER_SITE_OTHER): Payer: BC Managed Care – PPO | Admitting: Neurology

## 2022-10-07 VITALS — BP 112/72 | HR 93 | Ht 61.0 in | Wt 109.5 lb

## 2022-10-07 DIAGNOSIS — R259 Unspecified abnormal involuntary movements: Secondary | ICD-10-CM | POA: Insufficient documentation

## 2022-10-07 DIAGNOSIS — G2401 Drug induced subacute dyskinesia: Secondary | ICD-10-CM

## 2022-10-07 DIAGNOSIS — F32A Depression, unspecified: Secondary | ICD-10-CM | POA: Diagnosis not present

## 2022-10-07 NOTE — Progress Notes (Addendum)
Chief Complaint  Patient presents with   New Patient (Initial Visit)    Rm15, friend sarah present  Untrollable body movement: mouth (clicking teeth/jaw movement), bilateral leg (left is worse), bilateral arms (right arm worse),   cognitive issues:moca 17/30, feels like cogentin caused it       ASSESSMENT AND PLAN  Heather Lucas is a 62 y.o. female   Tardive dyskinesia Depression anxiety  Tardive dyskinesia first noted around 2023, when she was treated with large dose of Abilify 20 mg daily, olanzapine 7.5 mg daily, but had a medication provider,  Worsening abnormal movement in the setting of multiple medication changes, worsening mood disorder,  I have suggested her continue current new regime,  Laboratory evaluation to rule out other treatable etiology  Return to clinic in few months, reviewing her history, Ingrezza actually controlled her tardive dyskinesia to some some degree, when she is more stabilized with current regimen, may consider restarting at lower dose  DIAGNOSTIC DATA (LABS, IMAGING, TESTING) - I reviewed patient records, labs, notes, testing and imaging myself where available.   MEDICAL HISTORY:  Heather Lucas is a 62 year old female accompanied by her friend, seen in request by [facility] nurse practitioner   Kallie Locks, Thereasa Solo, for evaluation of abnormal movement, her primary care physician is Dr. Clelia Croft, Netta Corrigan. from [facility], initial evaluation on October 09, 2018 for  I reviewed and summarized the referring note.PMHX. HLD Chronic insomnia Major Depression, anxiety, panic attack CAD, s/p CABG  Reviewed extensive note from psychologist, patient had long history of depression anxiety, functioning very well for many years, she moved to [place] for 2 years to be close to her family, then lost her job, moved back to [place] again in January 2023,  During her [place] stay, she was put on a different medications for  her mood disorder, per note, including olanzapine 7.5 mg, Abilify 20 mg, she was originally taking Trintellix moving to [place], Xanax as needed  During her stay at [place], with the stress of moving, isolations, she reached out to a telehealth company for assistant, with the drastic change of the medication mentioned above.  During her initial visit with psychiatrist in June 2023, she reported abnormal mouth movement, hand shaking, was diagnosed with a deep dyskinesia, there was no major medication change since then, Abilify 5 mg daily, Zyprexa 7.5 mg half tablet daily, Xanax 0.5 mg twice daily  Around September 2023, medication was switched to Remeron 15 mg daily, also add on Cogentin 0.5 mg twice a day, then add on Ingrezza 30 mg daily,  By January 2024, Ingrezza titrating to 80 mg daily, office note also documented much less tardive dyskinesia movement, Wellbutrin XL 300 mg every morning was added on, also clonazepam 1 mg at bedtime  Mental health admission in June 2020 for for woresening major depression anxiety, deep dyskinesia, adding on great frustration,  Admission again in August 2020 for for worsening depression, panic attack, tardive dyskinesia, suicidal ideation, she was giving a trial of Cogentin 1 mg twice a day for her tardive dyskinesia,  She now complains of mental cloudiness, memory loss, almost constant mouth chewing movement, body jerking movement  When she first moved back to [place], 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  PHYSICAL EXAMNIATION:  Gen: NAD, conversant, well nourised, well groomed                     Cardiovascular: Regular rate rhythm, no peripheral edema, warm, nontender. Eyes: Conjunctivae clear  without exudates or hemorrhage Neck: Supple, no carotid bruits. Pulmonary: Clear to auscultation bilaterally   NEUROLOGICAL EXAM:  MENTAL STATUS: Speech/cognition: Anxious looking middle-aged female, constant most recent movement, upper extremity shoulder shrugging     10/07/2022    3:46 PM  Montreal Cognitive Assessment   Visuospatial/ Executive (0/5) 1  Naming (0/3) 3  Attention: Read list of digits (0/2) 2  Attention: Read list of letters (0/1) 1  Attention: Serial 7 subtraction starting at 100 (0/3) 3  Language: Repeat phrase (0/2) 0  Language : Fluency (0/1) 0  Abstraction (0/2) 1  Delayed Recall (0/5) 0  Orientation (0/6) 6  Total 17    CRANIAL NERVES: CN II: Visual fields are full to confrontation. Pupils are round equal and briskly reactive to light. CN III, IV, VI: extraocular movement are normal. No ptosis. CN V: Facial sensation is intact to light touch CN VII: Face is symmetric with normal eye closure  CN VIII: Hearing is normal to causal conversation. CN IX, X: Phonation is normal. CN XI: Head turning and shoulder shrug are intact  MOTOR: There is no pronator drift of out-stretched arms. Muscle bulk and tone are normal. Muscle strength is normal.  REFLEXES: Reflexes are 2+ and symmetric at the biceps, triceps, knees, and ankles. Plantar responses are flexor.  SENSORY: Intact to light touch, pinprick and vibratory sensation are intact in fingers and toes.  COORDINATION: There is no trunk or limb dysmetria noted.  GAIT/STANCE: Posture is normal. Gait is steady with normal steps, base, arm swing, and turning. Heel and toe walking are normal. Tandem gait is normal.  Romberg is absent.  REVIEW OF SYSTEMS:  Full 14 system review of systems performed and notable only for as above All other review of systems were negative.   ALLERGIES: Not on File  HOME MEDICATIONS: Current Outpatient Medications  Medication Sig Dispense Refill   benztropine  (COGENTIN) 1 MG tablet Take 1 mg by mouth 2 (two) times daily.     clonazePAM (KLONOPIN) 1 MG tablet Take 1 tablet (1 mg total) by mouth 2 (two) times daily. 30 tablet 0   clopidogrel (PLAVIX) 75 MG tablet Take 1 tablet (75 mg total) by mouth daily. 30 tablet 0   ezetimibe (ZETIA) 10 MG tablet Take 1 tablet (10 mg total) by mouth daily. 30 tablet 0   hydrOXYzine (ATARAX) 25 MG tablet Take 1 tablet (25 mg total) by mouth 3 (three) times daily as needed for anxiety. 30 tablet 0   midodrine (PROAMATINE) 2.5 MG tablet Take 2.5 mg by mouth daily.     midodrine (PROAMATINE) 5 MG tablet Take 1 tablet (5 mg total) by mouth daily before breakfast. (Patient taking differently: Take 5 mg by mouth in the morning.) 30 tablet 0   mirtazapine (REMERON) 7.5 MG tablet Take 1 tablet (7.5 mg total) by mouth at bedtime. 30 tablet 2   mupirocin cream (BACTROBAN) 2 % Apply topically 2 (two) times daily. 15 g 0   mupirocin ointment (BACTROBAN) 2 % Apply 1 Application topically 2 (two) times daily.     rosuvastatin (CRESTOR) 20 MG tablet Take 1 tablet (20 mg total) by mouth daily.  30 tablet 0   traZODone (DESYREL) 50 MG tablet Take 1 tablet (50 mg total) by mouth at bedtime as needed for sleep. 30 tablet 0   No current facility-administered medications for this visit.    PAST MEDICAL HISTORY: Past Medical History:  Diagnosis Date   Anxiety    Borderline hyperlipidemia    as of 10/2016: TC 134, TG 90, HDL 51, LDL 95 (PCP recently increase rosuvastatin dose from 10 to 20 mg)   Borderline hypertension    Coronary artery disease involving native coronary artery of native heart without angina pectoris 03/01/2020   [facility]:; Dr. Adin Hector:  multivessel CAD => 50% pLAD & 60% mLAD, 70% pLCx & 90% mLCx,  70% mid RCA => CABG x 4   Coronary artery disease with history of myocardial infarction without history of CABG    Depression    Family history of premature coronary  artery disease 08/26/2017   Hx of CABG x 4 03/19/2020   [facility] [place], [place] -> CABG x4 (LIMA-LAD, SVG-OM1, SVG-OM2, SVG-dRCA   Hyperlipidemia due to dietary fat intake 08/26/2017    PAST SURGICAL HISTORY: Past Surgical History:  Procedure Laterality Date   CORONARY ARTERY BYPASS GRAFT  03/2020   [facility], [place], [place] -> reportedly CABG x4   GXT/ETT  02/23/2020   From [facility]:: 9:30 min (Stage 3). 10.8 METS 80% MPHR; NO CP but + DOE. -> 1-2 mm Inf De[pression & AVR STE = c/w Ischemia   LAPAROSCOPIC CHOLECYSTECTOMY  1995   LEFT HEART CATH AND CORONARY ANGIOGRAPHY  03/01/2020   [facility]; Dr. Adin Hector:  multivessel CAD => 50% pLAD & 60% mLAD, 70% pLCx & 90% mLCx,  70% mid RCA => CABG   NM MYOVIEW LTD  02/2018   Clearly appears abnormal, referred for Apolinar Junes, [place]   TRANSTHORACIC ECHOCARDIOGRAM  02/16/2020   [facility]:  a) Echo 02/16/2020: EF 60% with mild MR and TR.; b) Post Op Limited Echo: EF 55-60%.  Normal wall motion.  Normal RV.  Mild RA dilation.  No pericardial effusion.  Left pleural effusion noted.   TRANSTHORACIC ECHOCARDIOGRAM  07/2021   ([facility])  EF 65 to 70%.  No RWMA other than possible septal motion.  Mild MAC.  Aortic sclerosis.  Otherwise normal.    FAMILY HISTORY: Family History  Problem Relation Age of Onset   Heart attack Mother 60       Recently died; age 42.   Cancer Father    Heart attack Brother    CAD Brother    Heart attack Maternal Grandmother    Diabetes Maternal Grandmother    Colon cancer Paternal Grandmother    Other Paternal Grandfather        Circulatory problems    SOCIAL HISTORY: Social History   Socioeconomic History   Marital status: Divorced    Spouse name: Not on file   Number of children: Not on file   Years of  education: 12   Highest education level: High school graduate  Occupational History    Employer: [employer]    Comment: Works doing Paramedic  Tobacco Use   Smoking status: Never   Smokeless tobacco: Never  Substance and Sexual Activity   Alcohol use: Not Currently   Drug use: Never   Sexual activity: Not Currently  Other Topics Concern  Not on file  Social History Narrative   She is single.  Lives alone.    Does not routinely exercise      About 3 years ago, she moved down to [place], [place] (in the [place]) delivered with her brother after being born and raised in [place].   Social Determinants of Health   Financial Resource Strain: Not on file  Food Insecurity: No Food Insecurity (09/21/2022)   Hunger Vital Sign    Worried About Running Out of Food in the Last Year: Never true    Ran Out of Food in the Last Year: Never true  Transportation Needs: No Transportation Needs (09/21/2022)   PRAPARE - Administrator, Civil Service (Medical): No    Lack of Transportation (Non-Medical): No  Physical Activity: High Risk (04/15/2019)   Received from [facility]   Physical Activity    How often do you engage in moderate physical activity for 30 minutes or more?: Never    How often do you engage in vigorous physical activity for 20 minutes or more?: Never    How many hours per day do you spend sitting?: More than 8 hours  Stress: Low Risk  (03/06/2021)   Received from [facility]   Stress    Stress in your Life: Not on file    Dealing with Stress: Not on file  Social Connections: Not on file  Intimate Partner Violence: Not At Risk (09/21/2022)   Humiliation, Afraid, Rape, and Kick questionnaire    Fear of Current or Ex-Partner: No    Emotionally Abused: No    Physically Abused: No    Sexually Abused: No      Levert Feinstein, M.D. Ph.D.  [facility] [address] [place], [place] [address] Ph:  [phone] Fax: [phone]  CC:  Mozingo, Thereasa Solo, NP [address] [place],  [place] [address]  Cleatis Polka., MD

## 2022-10-08 LAB — C-REACTIVE PROTEIN: CRP: <1 mg/L (ref 0–10)

## 2022-10-08 LAB — CK: Total CK: 40 U/L (ref 32–182)

## 2022-10-08 LAB — ANA W/REFLEX IF POSITIVE: Anti Nuclear Antibody (ANA): NEGATIVE

## 2022-10-08 LAB — SEDIMENTATION RATE: Sed Rate: 2 mm/h (ref 0–40)

## 2022-10-08 LAB — VITAMIN D 25 HYDROXY (VIT D DEFICIENCY, FRACTURES): Vit D, 25-Hydroxy: 53 ng/mL (ref 30.0–100.0)

## 2022-10-09 ENCOUNTER — Encounter: Payer: Self-pay | Admitting: Adult Health

## 2022-10-09 ENCOUNTER — Ambulatory Visit: Payer: BC Managed Care – PPO | Admitting: Adult Health

## 2022-10-09 DIAGNOSIS — F411 Generalized anxiety disorder: Secondary | ICD-10-CM | POA: Diagnosis not present

## 2022-10-09 DIAGNOSIS — G4700 Insomnia, unspecified: Secondary | ICD-10-CM

## 2022-10-09 DIAGNOSIS — F410 Panic disorder [episodic paroxysmal anxiety] without agoraphobia: Secondary | ICD-10-CM

## 2022-10-09 DIAGNOSIS — F331 Major depressive disorder, recurrent, moderate: Secondary | ICD-10-CM

## 2022-10-09 DIAGNOSIS — G2401 Drug induced subacute dyskinesia: Secondary | ICD-10-CM | POA: Diagnosis not present

## 2022-10-09 NOTE — Progress Notes (Addendum)
Heather Lucas [id number] March 28, 1960 62 y.o.  Subjective:   Patient ID:  Heather Lucas is a 62 y.o. (DOB 09/08/1960) female.  Chief Complaint: No chief complaint on file.   HPI Heather Lucas presents to the office today for follow-up of MDD, GAD, panic attacks and TD.  08/02/2021 - initial evaluation. Reports she moved back to [place] from [place] where she lived for 2 and 1/2 years. Now living alone in [place]. Has friends in the area and has adjusted to moving back to the area. While living in [place] was seen by a telemedicine provider and was started on Abilify 20mg  and Olanzapine 7.5mg . She was also taking Trintellix when she initially moved to [place] and had Xanax as needed. Reports feeling more anxious and a little depressed after the move. Mostly staying home - works from home and does not get out much. Felt like she was having some adjustment issues and reached out to a tele health company for assistance. Her medications at initial visit were: Lexapro 10mg  daily, Abilify 10mg  daily,Zyprexa 7.5mg  at hs and Xanax 0.5mg  daily. Reports TD and akathisia with that medication regimen and felt very uncomfortable. Reports peri oral mouth movements, legs shaking, hands shaking, left hand shaking started all which started after initiating antipsychotic medications.  Reports recent hospitalization for mood stabilization and TD symptoms.  Accompanied by a friend/church member.  Describes mood today as "better". Pleasant. Denies tearfulness. Mood symptoms - reports depression - sometimes. Reports anxiety - "about the same". Denies irritability. Denies panic attacks. Reports decreased worry, rumination, and over thinking. Mood is more consistent. Stating "I don't feel like I'm doing well". Feels like current medications are helpful, willing to consider changes. Reports decreased interest and motivation. Taking medications as prescribed.  Energy levels low. Active, does not have a regular  exercise routine. Enjoys some usual interests and activities. Single. Lives alone. Brother in [place]. Friends local. Attends church. Appetite adequate. Weight loss - 105 pounds - 61". Sleeping better at night Averages 12 to 15 hours.   Focus and concentration difficulties - memory issues - unable to work with TD and akthesia symptoms. Completing minimal tasks. Managing some aspects of household. 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  Previous medication trials:  Zoloft, Prozac, Wellbutrin, Lexapro, Trintellix, Hydroxyzine.    AIMS    Flowsheet Row Admission (Discharged) from 09/21/2022 in [facility] BEHAVIORAL MEDICINE ED from 09/20/2022 in [facility] Emergency Department at [facility] Office Visit from 12/13/2021 in [facility]  AIMS Total Score 14 14 28       AUDIT    Flowsheet Row Admission (Discharged) from 09/21/2022 in [facility] BEHAVIORAL MEDICINE  Alcohol Use Disorder Identification Test Final Score (AUDIT) 0      Mini-Mental    Flowsheet Row Office Visit from 07/30/2022 in [facility]  Total Score (max 30 points ) 30      Flowsheet Row Admission (Discharged) from 09/21/2022 in [facility] BEHAVIORAL MEDICINE ED from 09/20/2022 in [facility] Emergency Department at [facility] ED from 09/18/2022 in [facility] Emergency Department at [facility]  C-SSRS RISK CATEGORY Moderate Risk Moderate Risk No Risk        Review of Systems:  Review of Systems  Musculoskeletal:  Negative for gait problem.  Neurological:  Negative for tremors.  Psychiatric/Behavioral:         Please refer to HPI  Medications: I have reviewed the patient's current medications.  Current Outpatient Medications  Medication Sig Dispense Refill   benztropine (COGENTIN) 1 MG tablet Take 1 mg by mouth 2 (two) times daily.      clonazePAM (KLONOPIN) 1 MG tablet Take 1 tablet (1 mg total) by mouth 2 (two) times daily. 30 tablet 0   clopidogrel (PLAVIX) 75 MG tablet Take 1 tablet (75 mg total) by mouth daily. 30 tablet 0   ezetimibe (ZETIA) 10 MG tablet Take 1 tablet (10 mg total) by mouth daily. 30 tablet 0   hydrOXYzine (ATARAX) 25 MG tablet Take 1 tablet (25 mg total) by mouth 3 (three) times daily as needed for anxiety. 30 tablet 0   midodrine (PROAMATINE) 2.5 MG tablet Take 2.5 mg by mouth daily.     midodrine (PROAMATINE) 5 MG tablet Take 1 tablet (5 mg total) by mouth daily before breakfast. (Patient taking differently: Take 5 mg by mouth in the morning.) 30 tablet 0   mirtazapine (REMERON) 7.5 MG tablet Take 1 tablet (7.5 mg total) by mouth at bedtime. 30 tablet 2   mupirocin cream (BACTROBAN) 2 % Apply topically 2 (two) times daily. 15 g 0   mupirocin ointment (BACTROBAN) 2 % Apply 1 Application topically 2 (two) times daily.     rosuvastatin (CRESTOR) 20 MG tablet Take 1 tablet (20 mg total) by mouth daily. 30 tablet 0   traZODone (DESYREL) 50 MG tablet Take 1 tablet (50 mg total) by mouth at bedtime as needed for sleep. 30 tablet 0   No current facility-administered medications for this visit.    Medication Side Effects: None  Allergies: Not on File  Past Medical History:  Diagnosis Date   Anxiety    Borderline hyperlipidemia    as of 10/2016: TC 134, TG 90, HDL 51, LDL 95 (PCP recently increase rosuvastatin dose from 10 to 20 mg)   Borderline hypertension    Coronary artery disease involving native coronary artery of native heart without angina pectoris 03/01/2020   [facility]:; Dr. Adin Hector:  multivessel CAD => 50% pLAD & 60% mLAD, 70% pLCx & 90% mLCx,  70% mid RCA => CABG x 4   Coronary artery disease with history of myocardial infarction without history of CABG    Depression    Family history of premature coronary artery disease 08/26/2017   Hx of  CABG x 4 03/19/2020   [facility] [place], [place] -> CABG x4 (LIMA-LAD, SVG-OM1, SVG-OM2, SVG-dRCA   Hyperlipidemia due to dietary fat intake 08/26/2017    Past Medical History, Surgical history, Social history, and Family history were reviewed and updated as appropriate.   Please see review of systems for further details on the patient's review from today.   Objective:   Physical Exam:  There were no vitals taken for this visit.  Physical Exam Constitutional:      General: She is not in acute distress. Musculoskeletal:        General: No deformity.  Neurological:     Mental Status: She is alert and oriented to person, place, and time.     Coordination: Coordination normal.  Psychiatric:        Attention and Perception: Perception normal. She is inattentive. She does not perceive auditory or visual hallucinations.        Mood and Affect: Mood is anxious and depressed. Affect is labile and blunt. Affect is not angry or inappropriate.  Speech: Speech normal.        Behavior: Behavior normal.        Thought Content: Thought content normal. Thought content is not paranoid or delusional. Thought content does not include homicidal or suicidal ideation. Thought content does not include homicidal or suicidal plan.        Cognition and Memory: Cognition and memory normal.        Judgment: Judgment normal.     Comments: Insight intact     Lab Review:     Component Value Date/Time   NA 141 09/20/2022 1545   NA 141 10/16/2017 1412   K 3.5 09/20/2022 1545   CL 105 09/20/2022 1545   CO2 22 09/20/2022 1545   GLUCOSE 99 09/20/2022 1545   BUN 12 09/20/2022 1545   BUN 14 10/16/2017 1412   CREATININE 0.71 09/20/2022 1545   CALCIUM 9.4 09/20/2022 1545   PROT 7.7 09/20/2022 1545   PROT 6.0 03/03/2022 0833   ALBUMIN 4.6 09/20/2022 1545   ALBUMIN 4.1 03/03/2022 0833   AST 30 09/20/2022 1545   ALT 21 09/20/2022 1545   ALKPHOS 69 09/20/2022 1545   BILITOT 0.7  09/20/2022 1545   BILITOT 0.5 03/03/2022 0833   GFRNONAA >60 09/20/2022 1545   GFRAA 95 10/16/2017 1412       Component Value Date/Time   WBC 11.2 (H) 09/20/2022 1545   RBC 4.41 09/20/2022 1545   HGB 13.6 09/20/2022 1545   HCT 40.8 09/20/2022 1545   PLT 209 09/20/2022 1545   MCV 92.5 09/20/2022 1545   MCH 30.8 09/20/2022 1545   MCHC 33.3 09/20/2022 1545   RDW 11.7 09/20/2022 1545   LYMPHSABS 0.9 09/18/2022 1902   MONOABS 0.7 09/18/2022 1902   EOSABS 0.0 09/18/2022 1902   BASOSABS 0.0 09/18/2022 1902    No results found for: "POCLITH", "LITHIUM"   No results found for: "PHENYTOIN", "PHENOBARB", "VALPROATE", "CBMZ"   .res Assessment: Plan:    Plan:  PDMP reviewed  D/C Trintellix 5mg  daily - depression/anxiety - patient not taking  Continue Remeron 7.5 MG tablet - depression/anxiety  Continue Trazadone 50mg  at hs - sleep - plan to discontinue at next visit if sleeping well  Decrease Cogentin 1mg  BID - decrease from 1mg  BID to 0.5mg  BID - movement disorder. Seen by [facility] and suggested a decreased dose.  D/C Clonazepam 1mg  BID - anxiety - taper discussed and written instructions given. Will try and taper off this medication over the next 4 weeks.  D/C Hydroxyzine 25mg  at bedtime - sleep/anxiety  Discussed medications and treatment plans with patient support.  Patient totally disabled and unable to work at this time.   RTC 2 week.  Patient advised to contact office with any questions, adverse effects, or acute worsening in signs and symptoms.   Discussed potential benefits, risk, and side effects of benzodiazepines to include potential risk of tolerance and dependence, as well as possible drowsiness. Advised patient not to drive if experiencing drowsiness and to take lowest possible effective dose to minimize risk of dependence and tolerance.   Diagnoses and all orders for this visit:  Major depressive disorder, recurrent episode, moderate (HCC)  Generalized  anxiety disorder  Tardive dyskinesia  Panic attacks  Insomnia, unspecified type     Please see After Visit Summary for patient specific instructions.  Future Appointments  Date Time Provider Department Center  10/23/2022  2:00 PM Shantil Vallejo, Thereasa Solo, NP [facility] None  01/06/2023  3:00 PM Levert Feinstein, MD [facility] None  No orders of the defined types were placed in this encounter.   -------------------------------

## 2022-10-10 ENCOUNTER — Telehealth: Payer: Self-pay | Admitting: Cardiology

## 2022-10-10 NOTE — Telephone Encounter (Signed)
French Ana, see attached info on admit and d/c date on [place]. If you need anything else let me know.

## 2022-10-10 NOTE — Telephone Encounter (Signed)
Returned call to pt and let her know the paperwork is pending and she will be called once it is reading.

## 2022-10-10 NOTE — Telephone Encounter (Signed)
Patient's friend, Talbert Forest, states long-term disability paperwork was faxed to the office from [place] on 8/19 and again on 8/26. She would like to confirm the paperwork has been received and discuss any updates.

## 2022-10-10 NOTE — Telephone Encounter (Signed)
Addendum to attached messages.   Heather Lucas was admitted to [facility] on 09/21/22 and discharged on 09/26/22. Got this info from the hospital notes in her chart. Do you need anything else I can get for you. Vernona Rieger

## 2022-10-17 DIAGNOSIS — Z0289 Encounter for other administrative examinations: Secondary | ICD-10-CM

## 2022-10-17 NOTE — Telephone Encounter (Signed)
Paperwork left up front with admin staff

## 2022-10-17 NOTE — Telephone Encounter (Signed)
Paper work completed and given to Heather Lucas to review and sign ?

## 2022-10-20 ENCOUNTER — Other Ambulatory Visit: Payer: Self-pay | Admitting: Adult Health

## 2022-10-20 NOTE — Telephone Encounter (Signed)
Heather Lucas and she gave verbal permission for me to speak to [place]. They are calling to see if her disability forms are available and ready for pick up.

## 2022-10-20 NOTE — Telephone Encounter (Signed)
Calling for update. Please advise  

## 2022-10-21 NOTE — Telephone Encounter (Signed)
Pt has FU 9/12.  Continue Trazadone 50mg  at hs - sleep - plan to discontinue at next visit if sleeping well

## 2022-10-23 ENCOUNTER — Encounter: Payer: Self-pay | Admitting: Adult Health

## 2022-10-23 ENCOUNTER — Ambulatory Visit (INDEPENDENT_AMBULATORY_CARE_PROVIDER_SITE_OTHER): Payer: BC Managed Care – PPO | Admitting: Adult Health

## 2022-10-23 DIAGNOSIS — F410 Panic disorder [episodic paroxysmal anxiety] without agoraphobia: Secondary | ICD-10-CM

## 2022-10-23 DIAGNOSIS — G4700 Insomnia, unspecified: Secondary | ICD-10-CM

## 2022-10-23 DIAGNOSIS — F411 Generalized anxiety disorder: Secondary | ICD-10-CM

## 2022-10-23 DIAGNOSIS — F331 Major depressive disorder, recurrent, moderate: Secondary | ICD-10-CM | POA: Diagnosis not present

## 2022-10-23 DIAGNOSIS — G2571 Drug induced akathisia: Secondary | ICD-10-CM

## 2022-10-23 DIAGNOSIS — G2401 Drug induced subacute dyskinesia: Secondary | ICD-10-CM | POA: Diagnosis not present

## 2022-10-23 NOTE — Progress Notes (Signed)
Heather Lucas [id number] [birth year] 62 y.o.  Subjective:   Patient ID:  Heather Lucas is a 62 y.o. (DOB [birth year]) female.  Chief Complaint: No chief complaint on file.   HPI Heather Lucas presents to the office today for follow-up of MDD, GAD, panic attacks and TD.  08/02/2021 - initial evaluation. Reports she moved back to [place] from [place] where she lived for 2 and 1/2 years. Now living alone in [place]. Has friends in the area and has adjusted to moving back to the area. While living in [place] was seen by a telemedicine provider and was started on Abilify 20mg  and Olanzapine 7.5mg . She was also taking Trintellix when she initially moved to [place] and had Xanax as needed. Reports feeling more anxious and a little depressed after the move. Mostly staying home - works from home and does not get out much. Felt like she was having some adjustment issues and reached out to a tele health company for assistance. Her medications at initial visit were: Lexapro 10mg  daily, Abilify 10mg  daily,Zyprexa 7.5mg  at hs and Xanax 0.5mg  daily. Reports TD and akathisia with that medication regimen and felt very uncomfortable. Reports peri oral mouth movements, legs shaking, hands shaking, left hand shaking started all which started after initiating antipsychotic medications.  Reports recent hospitalization for mood stabilization and TD symptoms.  Reports recent falls.   Accompanied by a friend/church member - assisting with care on a daily basis.  Describes mood today as "better". Pleasant. Denies tearfulness. Mood symptoms - reports depression and anxiety- " a little bit". Denies irritability. Denies panic attacks. Denies worry, rumination, and over thinking. Mood is stable. Stating "I feel like I'm doing better". Feels like current medications are helpful, willing to consider further changes. Reports decreased interest and motivation. Taking medications as prescribed.  Energy levels low. Active,  does not have a regular exercise routine. Enjoys some usual interests and activities. Single. Lives alone. Brother in [place]. Friends local. Attends church. Appetite adequate. Weight stable - 105 pound - 61". Sleeping well at night. Averages 12 or more hours.   Reports focus and concentration difficulties - memory issues - unable to work with TD and akthesia symptoms. Completing minimal tasks. Managing some aspects of household. 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 Patient totally disabled and unable to work.  Previous medication trials:  Zoloft, Prozac, Wellbutrin, Lexapro, Trintellix, Hydroxyzine.  AIMS    Flowsheet Row Admission (Discharged) from 09/21/2022 in [facility] BEHAVIORAL MEDICINE ED from 09/20/2022 in [facility] Emergency Department at [facility] Office Visit from 12/13/2021 in [facility]  AIMS Total Score 14 14 28       AUDIT    Flowsheet Row Admission (Discharged) from 09/21/2022 in [facility] BEHAVIORAL MEDICINE  Alcohol Use Disorder Identification Test Final Score (AUDIT) 0      Mini-Mental    Flowsheet Row Office Visit from 07/30/2022 in [facility]  Total Score (max 30 points ) 30      Flowsheet Row Admission (Discharged) from 09/21/2022 in [facility] BEHAVIORAL MEDICINE ED from 09/20/2022 in [facility] Emergency Department at [facility] ED from 09/18/2022 in [facility] Emergency Department at [facility]  C-SSRS RISK CATEGORY Moderate Risk Moderate Risk No Risk        Review of Systems:  Review of Systems  Musculoskeletal:  Negative for gait problem.  Neurological:  Negative for  tremors.  Psychiatric/Behavioral:         Please refer to HPI    Medications: I have reviewed the patient's current medications.  Current Outpatient Medications  Medication Sig Dispense Refill    benztropine (COGENTIN) 1 MG tablet Take 1 mg by mouth 2 (two) times daily.     clonazePAM (KLONOPIN) 1 MG tablet Take 1 tablet (1 mg total) by mouth 2 (two) times daily. 30 tablet 0   clopidogrel (PLAVIX) 75 MG tablet Take 1 tablet (75 mg total) by mouth daily. 30 tablet 0   ezetimibe (ZETIA) 10 MG tablet Take 1 tablet (10 mg total) by mouth daily. 30 tablet 0   hydrOXYzine (ATARAX) 25 MG tablet Take 1 tablet (25 mg total) by mouth 3 (three) times daily as needed for anxiety. 30 tablet 0   midodrine (PROAMATINE) 2.5 MG tablet Take 2.5 mg by mouth daily.     midodrine (PROAMATINE) 5 MG tablet Take 1 tablet (5 mg total) by mouth daily before breakfast. (Patient taking differently: Take 5 mg by mouth in the morning.) 30 tablet 0   mirtazapine (REMERON) 7.5 MG tablet Take 1 tablet (7.5 mg total) by mouth at bedtime. 30 tablet 2   mupirocin cream (BACTROBAN) 2 % Apply topically 2 (two) times daily. 15 g 0   mupirocin ointment (BACTROBAN) 2 % Apply 1 Application topically 2 (two) times daily.     rosuvastatin (CRESTOR) 20 MG tablet Take 1 tablet (20 mg total) by mouth daily. 30 tablet 0   traZODone (DESYREL) 50 MG tablet Take 1 tablet (50 mg total) by mouth at bedtime as needed for sleep. 30 tablet 0   No current facility-administered medications for this visit.    Medication Side Effects: None  Allergies: Not on File  Past Medical History:  Diagnosis Date   Anxiety    Borderline hyperlipidemia    as of 10/2016: TC 134, TG 90, HDL 51, LDL 95 (PCP recently increase rosuvastatin dose from 10 to 20 mg)   Borderline hypertension    Coronary artery disease involving native coronary artery of native heart without angina pectoris 03/01/2020   [facility]:; Dr. Adin Hector:  multivessel CAD => 50% pLAD & 60% mLAD, 70% pLCx & 90% mLCx,  70% mid RCA => CABG x 4   Coronary artery disease with history of myocardial infarction without history of CABG     Depression    Family history of premature coronary artery disease 08/26/2017   Hx of CABG x 4 03/19/2020   [facility] [place], [place] -> CABG x4 (LIMA-LAD, SVG-OM1, SVG-OM2, SVG-dRCA   Hyperlipidemia due to dietary fat intake 08/26/2017    Past Medical History, Surgical history, Social history, and Family history were reviewed and updated as appropriate.   Please see review of systems for further details on the patient's review from today.   Objective:   Physical Exam:  There were no vitals taken for this visit.  Physical Exam Constitutional:      General: She is not in acute distress. Musculoskeletal:        General: No deformity.  Neurological:     Mental Status: She is alert and oriented to person, place, and time.     Coordination: Coordination normal.  Psychiatric:        Attention and Perception: Attention and perception normal. She does not perceive auditory or visual hallucinations.        Mood and Affect: Affect is not labile,  blunt, angry or inappropriate.        Speech: Speech normal.        Behavior: Behavior normal.        Thought Content: Thought content normal. Thought content is not paranoid or delusional. Thought content does not include homicidal or suicidal ideation. Thought content does not include homicidal or suicidal plan.        Cognition and Memory: Cognition and memory normal.        Judgment: Judgment normal.     Comments: Insight intact     Lab Review:     Component Value Date/Time   NA 141 09/20/2022 1545   NA 141 10/16/2017 1412   K 3.5 09/20/2022 1545   CL 105 09/20/2022 1545   CO2 22 09/20/2022 1545   GLUCOSE 99 09/20/2022 1545   BUN 12 09/20/2022 1545   BUN 14 10/16/2017 1412   CREATININE 0.71 09/20/2022 1545   CALCIUM 9.4 09/20/2022 1545   PROT 7.7 09/20/2022 1545   PROT 6.0 03/03/2022 0833   ALBUMIN 4.6 09/20/2022 1545   ALBUMIN 4.1 03/03/2022 0833   AST 30 09/20/2022 1545   ALT 21 09/20/2022 1545   ALKPHOS 69  09/20/2022 1545   BILITOT 0.7 09/20/2022 1545   BILITOT 0.5 03/03/2022 0833   GFRNONAA >60 09/20/2022 1545   GFRAA 95 10/16/2017 1412       Component Value Date/Time   WBC 11.2 (H) 09/20/2022 1545   RBC 4.41 09/20/2022 1545   HGB 13.6 09/20/2022 1545   HCT 40.8 09/20/2022 1545   PLT 209 09/20/2022 1545   MCV 92.5 09/20/2022 1545   MCH 30.8 09/20/2022 1545   MCHC 33.3 09/20/2022 1545   RDW 11.7 09/20/2022 1545   LYMPHSABS 0.9 09/18/2022 1902   MONOABS 0.7 09/18/2022 1902   EOSABS 0.0 09/18/2022 1902   BASOSABS 0.0 09/18/2022 1902    No results found for: "POCLITH", "LITHIUM"   No results found for: "PHENYTOIN", "PHENOBARB", "VALPROATE", "CBMZ"   .res Assessment: Plan:    Plan:  PDMP reviewed  Continue Remeron 7.5 MG tablet - depression/anxiety  Discontinue Trazadone 50mg  at hs - sleep    Cogentin 0.5mg  BID - movement disorder.    Clonazepam 0.5mg  daily anxiety - taper discussed and written instructions given. Will try and taper off this medication over the next 4 weeks.   Discussed medications and treatment plans with patient support.  Patient totally disabled and unable to work at this time.   RTC 2 week.  Patient advised to contact office with any questions, adverse effects, or acute worsening in signs and symptoms.   Discussed potential benefits, risk, and side effects of benzodiazepines to include potential risk of tolerance and dependence, as well as possible drowsiness. Advised patient not to drive if experiencing drowsiness and to take lowest possible effective dose to minimize risk of dependence and tolerance.   Diagnoses and all orders for this visit:  Major depressive disorder, recurrent episode, moderate (HCC)  Generalized anxiety disorder  Tardive dyskinesia  Panic attacks  Insomnia, unspecified type  Akathisia     Please see After Visit Summary for patient specific instructions.  Future Appointments  Date Time Provider Department  Center  01/06/2023  3:00 PM Levert Feinstein, MD [facility] None    No orders of the defined types were placed in this encounter.   -------------------------------

## 2022-10-24 ENCOUNTER — Telehealth: Payer: Self-pay | Admitting: Cardiology

## 2022-10-24 NOTE — Telephone Encounter (Signed)
Also made pt aware that there may be a $29 processing fee for paperwork.  JB, 10-24-22

## 2022-10-24 NOTE — Telephone Encounter (Signed)
See previous encounters.  Talbert Forest is following up on behalf of the patient. She states the call should not have been returned to the patient. She states medical records need to be sent to [facility]. She would like a call back to discuss.

## 2022-10-24 NOTE — Telephone Encounter (Signed)
Spoke w/pt about disability forms. Pt stated that she's waiting to receive paperwork from company that Dr. Herbie Baltimore will need to fill out. Said that she would bring in paperwork once received.  JB, 10-24-22

## 2022-10-27 NOTE — Telephone Encounter (Signed)
Is there a direct number to reach [place]? Per the pt's chart, there's only Excell Seltzer, & Maralyn Sago listed as emergency contacts & per pt's DPR, permission to only speak with pt or pt's brother, Janine Limbo, 10-27-22

## 2022-10-29 NOTE — Telephone Encounter (Signed)
Ms. Heather Lucas returning call. She is requesting records from 07/12/2022 to current date be sent to [employer]. She states that it is imperative that this be sent today.

## 2022-10-29 NOTE — Telephone Encounter (Signed)
Left voicemail to return call to office

## 2022-10-29 NOTE — Telephone Encounter (Signed)
Left vm for Ms. Heather Lucas [phone]) informing her that, per Burt Knack (Dr. Elissa Hefty nurse), the only paperwork received was a Medical Records Release form, but no paperwork for Dr. Herbie Baltimore to fill out. Informed Ms. Heather Lucas that Dr. Herbie Baltimore would not be back in office until end of week. Advised to call back to clarify needed paperwork.  JB, 10-29-22

## 2022-10-29 NOTE — Telephone Encounter (Signed)
Patient ask for records she states she has spoken with Fanny Dance ans they state needing information from our office. She is adamant she speak to someone ASAP.  She is very rude and wants a call now  [phone]. Heather Lucas

## 2022-11-02 NOTE — Telephone Encounter (Signed)
I have not seen any forms

## 2022-11-07 ENCOUNTER — Telehealth: Payer: Self-pay | Admitting: Adult Health

## 2022-11-07 ENCOUNTER — Other Ambulatory Visit: Payer: Self-pay | Admitting: Adult Health

## 2022-11-07 ENCOUNTER — Ambulatory Visit: Payer: BC Managed Care – PPO | Admitting: Adult Health

## 2022-11-07 DIAGNOSIS — F331 Major depressive disorder, recurrent, moderate: Secondary | ICD-10-CM

## 2022-11-07 MED ORDER — MIRTAZAPINE 7.5 MG PO TABS
7.5000 mg | ORAL_TABLET | Freq: Every day | ORAL | 0 refills | Status: DC
Start: 2022-11-07 — End: 2023-01-15

## 2022-11-07 NOTE — Addendum Note (Signed)
Addended by: Karin Lieu T on: 11/07/2022 01:36 PM   Modules accepted: Orders

## 2022-11-07 NOTE — Telephone Encounter (Signed)
Responded to via pharmacy RF request.

## 2022-11-07 NOTE — Telephone Encounter (Signed)
Both sent 

## 2022-11-07 NOTE — Telephone Encounter (Signed)
Note from 9/12:    Cogentin 0.5mg  BID - movement disorder.   RF request is for 1 mg. Her insurance expires Monday, trying to get all her RF. Please approve as appropriate.

## 2022-11-07 NOTE — Telephone Encounter (Signed)
Addendum to attached message. Heather Lucas just called and said that her Benztropine was not called in like the Mirtazapine. She is panicking now. Will the Benztropine be called in? See message regarding her insurance expiring on Monday 9/30.

## 2022-11-07 NOTE — Telephone Encounter (Signed)
Next visit is 11/28/22. Heather Lucas's insurance expires on Monday 11/10/22. She asks to please call her refills in on Mirtazapine and Benztropine 1 mg to:   [facility] [place], [place] - [address]    Phone: [phone]  Fax: [phone]      She just wants to make sure she gets them filled before her insurance expires on Monday 11/10/22.

## 2022-11-12 NOTE — Telephone Encounter (Signed)
Left vm on Ms. Shirley's phone [phone]) to contact Medical Records in regards to paperwork that has been requested. I contacted Medical Records last week & was told the pt needs to call to request forms.  I will print forms from [employer] & discuss situation with Rozann Lesches. and/or Elmer Ramp, 11-12-22

## 2022-11-14 ENCOUNTER — Ambulatory Visit: Payer: BC Managed Care – PPO | Admitting: Adult Health

## 2022-11-14 NOTE — Progress Notes (Signed)
No charge. 

## 2022-11-17 ENCOUNTER — Ambulatory Visit: Payer: BC Managed Care – PPO | Admitting: Neurology

## 2022-11-18 ENCOUNTER — Ambulatory Visit: Payer: BC Managed Care – PPO | Admitting: Adult Health

## 2022-11-24 ENCOUNTER — Telehealth: Payer: Self-pay | Admitting: Neurology

## 2022-11-24 NOTE — Telephone Encounter (Signed)
LVM and sent mychart msg informing pt of need to reschedule 01/06/23 appt - MD out

## 2022-11-25 ENCOUNTER — Encounter: Payer: Self-pay | Admitting: Adult Health

## 2022-11-25 ENCOUNTER — Ambulatory Visit (INDEPENDENT_AMBULATORY_CARE_PROVIDER_SITE_OTHER): Payer: BC Managed Care – PPO | Admitting: Adult Health

## 2022-11-25 DIAGNOSIS — G2401 Drug induced subacute dyskinesia: Secondary | ICD-10-CM

## 2022-11-25 DIAGNOSIS — G2571 Drug induced akathisia: Secondary | ICD-10-CM | POA: Diagnosis not present

## 2022-11-25 DIAGNOSIS — F411 Generalized anxiety disorder: Secondary | ICD-10-CM | POA: Diagnosis not present

## 2022-11-25 DIAGNOSIS — G4700 Insomnia, unspecified: Secondary | ICD-10-CM

## 2022-11-25 DIAGNOSIS — F331 Major depressive disorder, recurrent, moderate: Secondary | ICD-10-CM | POA: Diagnosis not present

## 2022-11-25 NOTE — Progress Notes (Signed)
Heather Lucas [id number] [birth year] 62 y.o.  Subjective:   Patient ID:  Heather Lucas is a 62 y.o. (DOB 1960-11-25) female.  Chief Complaint: No chief complaint on file.   HPI Heather Lucas presents to the office today for follow-up of MDD, GAD, panic attacks and TD.  08/02/2021 - initial evaluation. Reports she moved back to [place] from [place] where she lived for 2 and 1/2 years. Now living alone in [place]. Has friends in the area and has adjusted to moving back to the area. While living in [place] was seen by a telemedicine provider and was started on Abilify 20mg  and Olanzapine 7.5mg . She was also taking Trintellix when she initially moved to [place] and had Xanax as needed. Reports feeling more anxious and a little depressed after the move. Mostly staying home - works from home and does not get out much. Felt like she was having some adjustment issues and reached out to a tele health company for assistance. Her medications at initial visit were: Lexapro 10mg  daily, Abilify 10mg  daily,Zyprexa 7.5mg  at hs and Xanax 0.5mg  daily. Reports TD and akathisia with that medication regimen and felt very uncomfortable. Reports peri oral mouth movements, legs shaking, hands shaking, left hand shaking started all which started after initiating antipsychotic medications.  Reports recent hospitalization for mood stabilization and TD symptoms.  Reports two falls over the past few months - friend notes she has a prior history from years ago of getting up in the middle of the night. Friend reports she had over taken medications while living with her.   Describes mood today as "improving". Pleasant. Denies tearfulness. Mood symptoms - reports depression and anxiety - " a little bit". Denies irritability. Denies panic attacks - has periods of not catching her breath, but getting better. Reports worry, rumination, and over thinking. Mood is stable (friend agrees). Stating "I feel like I'm getting  better". Feels like current medications are helpful, willing to consider further changes. Reports improved interest and motivation. Taking medications as prescribed.  Energy levels low. Active, does not have a regular exercise routine. Reports walking some days. Enjoys some usual interests and activities. Single. Lives alone. Brother in [place]. Friends local. Attends church. Appetite adequate - needs to eat more. Weight stable - 104 pound - 61". Discussed weight loss and increasing calorie intake. Sleeps well at night. Averages 12 or more hours.   Reports focus and concentration difficulties "sometimes". Reports memory is "iffy" sometimes. Is unable to work at this time due to mood symptoms, TD and akthesia symptoms. Completing minimal tasks. Managing some aspects of household. 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 Patient totally disabled and unable to work. Denies SI and HI. Denies AH or AH. Denies self harm. Denies substance use.  Previous medication trials:  Zoloft, Prozac, Wellbutrin, Lexapro, Trintellix, Hydroxyzine.   AIMS    Flowsheet Row Admission (Discharged) from 09/21/2022 in [facility]AL MEDICINE ED from 09/20/2022 in [facility] Emergency Department at [facility] Office Visit from 12/13/2021 in [facility]  AIMS Total Score 14 14 28       AUDIT    Flowsheet Row Admission (Discharged) from 09/21/2022 in [facility] BEHAVIORAL MEDICINE  Alcohol Use Disorder Identification Test Final Score (AUDIT) 0      Mini-Mental    Flowsheet Row Office Visit from 07/30/2022 in [facility]  Total Score (max 30 points ) 30  Flowsheet Row Admission (Discharged) from 09/21/2022 in [facility] BE[facility]IC[facility] ED from 09/20/2022 in [facility] Emergency Department at [facility] ED from 09/18/2022 in [facility] Emergency Department  at [facility]  C-SSRS RISK CATEGORY Moderate Risk Moderate Risk No Risk        Review of Systems:  Review of Systems  Musculoskeletal:  Negative for gait problem.  Neurological:  Negative for tremors.  Psychiatric/Behavioral:         Please refer to HPI    Medications: I have reviewed the patient's current medications.  Current Outpatient Medications  Medication Sig Dispense Refill   benztropine (COGENTIN) 1 MG tablet TAKE ONE TABLET BY MOUTH TWICE DAILY 60 tablet 0   clonazePAM (KLONOPIN) 1 MG tablet Take 1 tablet (1 mg total) by mouth 2 (two) times daily. 30 tablet 0   clopidogrel (PLAVIX) 75 MG tablet Take 1 tablet (75 mg total) by mouth daily. 30 tablet 0   ezetimibe (ZETIA) 10 MG tablet Take 1 tablet (10 mg total) by mouth daily. 30 tablet 0   hydrOXYzine (ATARAX) 25 MG tablet Take 1 tablet (25 mg total) by mouth 3 (three) times daily as needed for anxiety. 30 tablet 0   midodrine (PROAMATINE) 2.5 MG tablet Take 2.5 mg by mouth daily.     midodrine (PROAMATINE) 5 MG tablet Take 1 tablet (5 mg total) by mouth daily before breakfast. (Patient taking differently: Take 5 mg by mouth in the morning.) 30 tablet 0   mirtazapine (REMERON) 7.5 MG tablet Take 1 tablet (7.5 mg total) by mouth at bedtime. 30 tablet 0   mupirocin cream (BACTROBAN) 2 % Apply topically 2 (two) times daily. 15 g 0   mupirocin ointment (BACTROBAN) 2 % Apply 1 Application topically 2 (two) times daily.     rosuvastatin (CRESTOR) 20 MG tablet Take 1 tablet (20 mg total) by mouth daily. 30 tablet 0   traZODone (DESYREL) 50 MG tablet Take 1 tablet (50 mg total) by mouth at bedtime as needed for sleep. 30 tablet 0   No current facility-administered medications for this visit.    Medication Side Effects: None  Allergies: No Known Allergies  Past Medical History:  Diagnosis Date   Anxiety    Borderline hyperlipidemia    as of 10/2016: TC 134, TG 90, HDL 51, LDL 95 (PCP recently increase rosuvastatin  dose from 10 to 20 mg)   Borderline hypertension    Coronary artery disease involving native coronary artery of native heart without angina pectoris 03/01/2020   [facility]:; Dr. Adin Hector:  multivessel CAD => 50% pLAD & 60% mLAD, 70% pLCx & 90% mLCx,  70% mid RCA => CABG x 4   Coronary artery disease with history of myocardial infarction without history of CABG    Depression    Family history of premature coronary artery disease 08/26/2017   Hx of CABG x 4 03/19/2020   [facility], [place], [place] -> CABG x4 (LIMA-LAD, SVG-OM1, SVG-OM2, SVG-dRCA   Hyperlipidemia due to dietary fat intake 08/26/2017    Past Medical History, Surgical history, Social history, and Family history were reviewed and updated as appropriate.   Please see review of systems for further details on the patient's review from today.   Objective:   Physical Exam:  There were no vitals taken for this visit.  Physical Exam Constitutional:      General: She is not in acute distress. Musculoskeletal:  General: No deformity.  Neurological:     Mental Status: She is alert and oriented to person, place, and time.     Coordination: Coordination normal.  Psychiatric:        Attention and Perception: Attention and perception normal. She does not perceive auditory or visual hallucinations.        Mood and Affect: Mood normal. Mood is not anxious or depressed. Affect is not labile, blunt, angry or inappropriate.        Speech: Speech normal.        Behavior: Behavior normal.        Thought Content: Thought content normal. Thought content is not paranoid or delusional. Thought content does not include homicidal or suicidal ideation. Thought content does not include homicidal or suicidal plan.        Cognition and Memory: Cognition and memory normal.        Judgment: Judgment normal.     Comments: Insight intact     Lab Review:     Component Value  Date/Time   NA 141 09/20/2022 1545   NA 141 10/16/2017 1412   K 3.5 09/20/2022 1545   CL 105 09/20/2022 1545   CO2 22 09/20/2022 1545   GLUCOSE 99 09/20/2022 1545   BUN 12 09/20/2022 1545   BUN 14 10/16/2017 1412   CREATININE 0.71 09/20/2022 1545   CALCIUM 9.4 09/20/2022 1545   PROT 7.7 09/20/2022 1545   PROT 6.0 03/03/2022 0833   ALBUMIN 4.6 09/20/2022 1545   ALBUMIN 4.1 03/03/2022 0833   AST 30 09/20/2022 1545   ALT 21 09/20/2022 1545   ALKPHOS 69 09/20/2022 1545   BILITOT 0.7 09/20/2022 1545   BILITOT 0.5 03/03/2022 0833   GFRNONAA >60 09/20/2022 1545   GFRAA 95 10/16/2017 1412       Component Value Date/Time   WBC 11.2 (H) 09/20/2022 1545   RBC 4.41 09/20/2022 1545   HGB 13.6 09/20/2022 1545   HCT 40.8 09/20/2022 1545   PLT 209 09/20/2022 1545   MCV 92.5 09/20/2022 1545   MCH 30.8 09/20/2022 1545   MCHC 33.3 09/20/2022 1545   RDW 11.7 09/20/2022 1545   LYMPHSABS 0.9 09/18/2022 1902   MONOABS 0.7 09/18/2022 1902   EOSABS 0.0 09/18/2022 1902   BASOSABS 0.0 09/18/2022 1902    No results found for: "POCLITH", "LITHIUM"   No results found for: "PHENYTOIN", "PHENOBARB", "VALPROATE", "CBMZ"   .res Assessment: Plan:    Plan:  PDMP reviewed  Continue Remeron 7.5 MG tablet - depression/anxiety  Cogentin 0.5mg  BID - movement disorder.    Discontinue Clonazepam 0.5mg  - 1/2 pill daily   Discussed medications and treatment plans with patient support.  Patient totally disabled and unable to work at this time.   RTC 2 week.  Patient advised to contact office with any questions, adverse effects, or acute worsening in signs and symptoms.   Discussed potential benefits, risk, and side effects of benzodiazepines to include potential risk of tolerance and dependence, as well as possible drowsiness. Advised patient not to drive if experiencing drowsiness and to take lowest possible effective dose to minimize risk of dependence and tolerance.   Diagnoses and all  orders for this visit:  Major depressive disorder, recurrent episode, moderate (HCC)  Generalized anxiety disorder  Tardive dyskinesia  Akathisia  Insomnia, unspecified type     Please see After Visit Summary for patient specific instructions.  Future Appointments  Date Time Provider Department Center  01/19/2023  1:00 PM Levert Feinstein,  MD [facility] None    No orders of the defined types were placed in this encounter.   -------------------------------

## 2022-12-03 ENCOUNTER — Other Ambulatory Visit: Payer: Self-pay | Admitting: Adult Health

## 2022-12-03 MED ORDER — BENZTROPINE MESYLATE 0.5 MG PO TABS: 0.5000 mg | ORAL_TABLET | Freq: Two times a day (BID) | ORAL | 0 refills | Status: DC

## 2022-12-09 ENCOUNTER — Ambulatory Visit: Payer: BC Managed Care – PPO | Admitting: Adult Health

## 2022-12-22 ENCOUNTER — Encounter: Payer: Self-pay | Admitting: Adult Health

## 2022-12-22 ENCOUNTER — Ambulatory Visit (INDEPENDENT_AMBULATORY_CARE_PROVIDER_SITE_OTHER): Payer: Medicaid Other | Admitting: Adult Health

## 2022-12-22 DIAGNOSIS — G4700 Insomnia, unspecified: Secondary | ICD-10-CM

## 2022-12-22 DIAGNOSIS — G2401 Drug induced subacute dyskinesia: Secondary | ICD-10-CM

## 2022-12-22 DIAGNOSIS — F411 Generalized anxiety disorder: Secondary | ICD-10-CM | POA: Diagnosis not present

## 2022-12-22 DIAGNOSIS — F331 Major depressive disorder, recurrent, moderate: Secondary | ICD-10-CM

## 2022-12-22 DIAGNOSIS — F410 Panic disorder [episodic paroxysmal anxiety] without agoraphobia: Secondary | ICD-10-CM

## 2022-12-22 NOTE — Progress Notes (Addendum)
Heather Lucas [id number] 01-Jul-1960 62 y.o.  Subjective:   Patient ID:  Heather Lucas is a 62 y.o. (DOB Sep 11, 1960) female.  Chief Complaint: No chief complaint on file.   HPI Heather Lucas presents to the office today for follow-up of MDD, GAD, panic attacks and TD.  08/02/2021 - initial evaluation. Reports she moved back to [place] from [place] where she lived for 2 and 1/2 years. Now living alone in [place]. Has friends in the area and has adjusted to moving back to the area. While living in [place] was seen by a telemedicine provider and was started on Abilify 20mg  and Olanzapine 7.5mg . She was also taking Trintellix when she initially moved to [place] and had Xanax as needed. Reports feeling more anxious and a little depressed after the move. Mostly staying home - works from home and does not get out much. Felt like she was having some adjustment issues and reached out to a tele health company for assistance. Her medications at initial visit were: Lexapro 10mg  daily, Abilify 10mg  daily,Zyprexa 7.5mg  at hs and Xanax 0.5mg  daily. Reports TD and akathisia with that medication regimen and felt very uncomfortable. Reports peri oral mouth movements, legs shaking, hands shaking, left hand shaking started all which started after initiating antipsychotic medications.  Describes mood today as "a little bit of improvement". Pleasant. Flat affect. Denies tearfulness. Mood symptoms - reports depression and anxiety. Denies irritability. Denies panic attacks - has periods of not catching her breath - "a few times a week". Reports worry, rumination, and over thinking. Mood is improving "some". Stating "I feel like I'm making some progress". [place] - 4 days a week - 10 week program. Reports recent falls. Reports falling in the bath tub. Feels like current medications are helpful - taking Remeron 7.5mg  daily at bedtime and Cogentin 0.5mg  twice daily. Reports improved interest  and motivation. Taking medications as prescribed.  Energy levels low. Active, does not have a regular exercise routine. Reports walking some days - getting fatigued easily.. Does not enjoy usual interests and activities - "I don't know what that is right now". Single. Lives alone. Brother in [place]. Friends local. Attends church. Appetite adequate - needs to eat more. Weight loss - 102 from 104 pounds - 61". Discussed weight loss and increasing calorie intake. Sleeps well at night. Averages 12 hours.   Reports focus and concentration difficulties "not able to remember things". Reports having to make a lot of notes to remember what she needs to do. Is unable to work at this time due to mood symptoms, TD and akthesia symptoms. Completing minimal tasks. Managing some aspects of household - "very little". 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 Has applied for LTD benefits. Patient totally disabled and unable to work. Denies SI and HI. Denies AH or AH. Denies self harm. Denies substance use.  Previous medication trials:  Zoloft, Prozac, Wellbutrin, Lexapro, Trintellix, Hydroxyzine.     AIMS    Flowsheet Row Admission (Discharged) from 09/21/2022 in [facility] BEHAVIORAL MEDICINE ED from 09/20/2022 in [facility] Emergency Department at [facility] Office Visit from 12/13/2021 in [facility]  AIMS Total Score 14 14 28       AUDIT    Flowsheet Row Admission (Discharged) from 09/21/2022 in [facility] BEHAVIORAL MEDICINE  Alcohol Use Disorder Identification Test Final Score (AUDIT) 0      Mini-Mental    Flowsheet  Row Office Visit from 07/30/2022 in [facility]  Total Score (max 30 points ) 30      Flowsheet Row Admission (Discharged) from 09/21/2022 in [facility] BE[facility]AV[facility]AL MEDICINE ED from 09/20/2022 in [facility] Emergency Department at  [facility] ED from 09/18/2022 in [facility] Emergency Department at [facility]  C-SSRS RISK CATEGORY Moderate Risk Moderate Risk No Risk        Review of Systems:  Review of Systems  Musculoskeletal:  Negative for gait problem.  Neurological:  Negative for tremors.  Psychiatric/Behavioral:         Please refer to HPI    Medications: I have reviewed the patient's current medications.  Current Outpatient Medications  Medication Sig Dispense Refill   benztropine (COGENTIN) 0.5 MG tablet Take 1 tablet (0.5 mg total) by mouth 2 (two) times daily. 60 tablet 0   benztropine (COGENTIN) 1 MG tablet TAKE ONE TABLET BY MOUTH TWICE DAILY 60 tablet 0   clonazePAM (KLONOPIN) 1 MG tablet Take 1 tablet (1 mg total) by mouth 2 (two) times daily. 30 tablet 0   clopidogrel (PLAVIX) 75 MG tablet Take 1 tablet (75 mg total) by mouth daily. 30 tablet 0   ezetimibe (ZETIA) 10 MG tablet Take 1 tablet (10 mg total) by mouth daily. 30 tablet 0   hydrOXYzine (ATARAX) 25 MG tablet Take 1 tablet (25 mg total) by mouth 3 (three) times daily as needed for anxiety. 30 tablet 0   midodrine (PROAMATINE) 2.5 MG tablet Take 2.5 mg by mouth daily.     midodrine (PROAMATINE) 5 MG tablet Take 1 tablet (5 mg total) by mouth daily before breakfast. (Patient taking differently: Take 5 mg by mouth in the morning.) 30 tablet 0   mirtazapine (REMERON) 7.5 MG tablet Take 1 tablet (7.5 mg total) by mouth at bedtime. 30 tablet 0   mupirocin cream (BACTROBAN) 2 % Apply topically 2 (two) times daily. 15 g 0   mupirocin ointment (BACTROBAN) 2 % Apply 1 Application topically 2 (two) times daily.     rosuvastatin (CRESTOR) 20 MG tablet Take 1 tablet (20 mg total) by mouth daily. 30 tablet 0   traZODone (DESYREL) 50 MG tablet Take 1 tablet (50 mg total) by mouth at bedtime as needed for sleep. 30 tablet 0   No current facility-administered medications for this visit.    Medication Side Effects: None  Allergies: No  Known Allergies  Past Medical History:  Diagnosis Date   Anxiety    Borderline hyperlipidemia    as of 10/2016: TC 134, TG 90, HDL 51, LDL 95 (PCP recently increase rosuvastatin dose from 10 to 20 mg)   Borderline hypertension    Coronary artery disease involving native coronary artery of native heart without angina pectoris 03/01/2020   [facility]:; Dr. Adin Hector:  multivessel CAD => 50% pLAD & 60% mLAD, 70% pLCx & 90% mLCx,  70% mid RCA => CABG x 4   Coronary artery disease with history of myocardial infarction without history of CABG    Depression    Family history of premature coronary artery disease 08/26/2017   Hx of CABG x 4 03/19/2020   [facility] [place], [place] -> CABG x4 (LIMA-LAD, SVG-OM1, SVG-OM2, SVG-dRCA   Hyperlipidemia due to dietary fat intake 08/26/2017    Past Medical History, Surgical history, Social history, and Family history were reviewed and updated as appropriate.   Please see review of  systems for further details on the patient's review from today.   Objective:   Physical Exam:  There were no vitals taken for this visit.  Physical Exam Constitutional:      General: She is not in acute distress. Musculoskeletal:        General: No deformity.  Neurological:     Mental Status: She is alert and oriented to person, place, and time.     Coordination: Coordination normal.  Psychiatric:        Attention and Perception: Perception normal. She is inattentive. She does not perceive auditory or visual hallucinations.        Mood and Affect: Mood is anxious and depressed. Affect is blunt and flat. Affect is not labile, angry or inappropriate.        Speech: Speech normal.        Behavior: Behavior normal.        Thought Content: Thought content normal. Thought content is not paranoid or delusional. Thought content does not include homicidal or suicidal ideation. Thought content does not include  homicidal or suicidal plan.        Cognition and Memory: Cognition is impaired. She exhibits impaired recent memory.        Judgment: Judgment normal.     Comments: Insight intact     Lab Review:     Component Value Date/Time   NA 141 09/20/2022 1545   NA 141 10/16/2017 1412   K 3.5 09/20/2022 1545   CL 105 09/20/2022 1545   CO2 22 09/20/2022 1545   GLUCOSE 99 09/20/2022 1545   BUN 12 09/20/2022 1545   BUN 14 10/16/2017 1412   CREATININE 0.71 09/20/2022 1545   CALCIUM 9.4 09/20/2022 1545   PROT 7.7 09/20/2022 1545   PROT 6.0 03/03/2022 0833   ALBUMIN 4.6 09/20/2022 1545   ALBUMIN 4.1 03/03/2022 0833   AST 30 09/20/2022 1545   ALT 21 09/20/2022 1545   ALKPHOS 69 09/20/2022 1545   BILITOT 0.7 09/20/2022 1545   BILITOT 0.5 03/03/2022 0833   GFRNONAA >60 09/20/2022 1545   GFRAA 95 10/16/2017 1412       Component Value Date/Time   WBC 11.2 (H) 09/20/2022 1545   RBC 4.41 09/20/2022 1545   HGB 13.6 09/20/2022 1545   HCT 40.8 09/20/2022 1545   PLT 209 09/20/2022 1545   MCV 92.5 09/20/2022 1545   MCH 30.8 09/20/2022 1545   MCHC 33.3 09/20/2022 1545   RDW 11.7 09/20/2022 1545   LYMPHSABS 0.9 09/18/2022 1902   MONOABS 0.7 09/18/2022 1902   EOSABS 0.0 09/18/2022 1902   BASOSABS 0.0 09/18/2022 1902    No results found for: "POCLITH", "LITHIUM"   No results found for: "PHENYTOIN", "PHENOBARB", "VALPROATE", "CBMZ"   .res Assessment: Plan:    Plan:  PDMP reviewed  Continue Remeron 7.5 MG tablet - depression/anxiety  Cogentin 0.5mg  BID - movement disorder.     Discussed medications and treatment plans with patient support.  Patient totally disabled and unable to work at this time.   Patient receiving long term disability benefits.   RTC 4 weeks.  Patient advised to contact office with any questions, adverse effects, or acute worsening in signs and symptoms.   Discussed potential benefits, risk, and side effects of benzodiazepines to include potential risk of  tolerance and dependence, as well as possible drowsiness. Advised patient not to drive if experiencing drowsiness and to take lowest possible effective dose to minimize risk of dependence and tolerance.  Diagnoses and all orders for this visit:  Major depressive disorder, recurrent episode, moderate (HCC)  Generalized anxiety disorder  Tardive dyskinesia  Insomnia, unspecified type  Panic attacks     Please see After Visit Summary for patient specific instructions.  Future Appointments  Date Time Provider Department Center  01/19/2023  1:00 PM Levert Feinstein, MD [facility] None    No orders of the defined types were placed in this encounter.   -------------------------------

## 2022-12-24 ENCOUNTER — Encounter: Payer: Self-pay | Admitting: Psychiatry

## 2023-01-06 ENCOUNTER — Ambulatory Visit: Payer: BC Managed Care – PPO | Admitting: Neurology

## 2023-01-06 ENCOUNTER — Ambulatory Visit: Payer: Medicaid Other | Attending: Cardiology | Admitting: Cardiology

## 2023-01-06 ENCOUNTER — Telehealth: Payer: Self-pay | Admitting: Cardiology

## 2023-01-06 ENCOUNTER — Encounter: Payer: Self-pay | Admitting: Cardiology

## 2023-01-06 VITALS — BP 122/70 | HR 80 | Ht 61.0 in | Wt 108.0 lb

## 2023-01-06 DIAGNOSIS — E785 Hyperlipidemia, unspecified: Secondary | ICD-10-CM

## 2023-01-06 DIAGNOSIS — Z951 Presence of aortocoronary bypass graft: Secondary | ICD-10-CM | POA: Diagnosis not present

## 2023-01-06 DIAGNOSIS — I959 Hypotension, unspecified: Secondary | ICD-10-CM

## 2023-01-06 DIAGNOSIS — I2510 Atherosclerotic heart disease of native coronary artery without angina pectoris: Secondary | ICD-10-CM | POA: Diagnosis not present

## 2023-01-06 DIAGNOSIS — I9581 Postprocedural hypotension: Secondary | ICD-10-CM

## 2023-01-06 DIAGNOSIS — R0609 Other forms of dyspnea: Secondary | ICD-10-CM

## 2023-01-06 DIAGNOSIS — I255 Ischemic cardiomyopathy: Secondary | ICD-10-CM

## 2023-01-06 NOTE — Progress Notes (Signed)
Cardiology Office Note    Date:  01/06/2023  ID:  Heather Lucas, DOB 02/09/1961, MRN [id number] PCP:  Cleatis Polka., MD  Cardiologist:  Bryan Lemma, MD  Electrophysiologist:  None   Chief Complaint: Shortness of breath   History of Present Illness: .    Heather Lucas is a 62 y.o. female with visit-pertinent history of CAD s/p CABG x4, ICM, hyperlipidemia, dyspnea on exertion, depression and tardive dyskinesia who presents regarding shortness of breath and dizziness.   Heather Lucas was evaluated in July 2019 in the setting of abnormal coronary calcium score and fatigue.  She then moved to [place] to live near her family and while living in [place] she underwent CABG x 4.  It was noted she had cardiomyopathy with reduced EF.  Her last echocardiogram on 07/19/2021 indicated LVEF of 65 to 70%, LV with normal function, no RWMA, LV diastolic parameters were grossly normal despite abnormal septal and cardiac motion.  There was aortic valve sclerosis present without evidence of aortic valve stenosis.  She was last seen by Dr. Herbie Baltimore on 07/02/2022.  She noted increased bruising her aspirin was stopped and she was continued on Plavix.  Today patient presents for acute visit regarding increased dizziness and shortness of breath.  Patient reports that yesterday she started experiencing some dizziness and lightheadedness with position changes, that resolves after a few seconds.  She notes that when she lays down or when she sits up she will become dizzy, feeling as though the room spins.  She also notes that a few weeks ago she started having some increased shortness of breath, she notes that she just feels as though she is breathing harder.  She denies chest pain, palpitations, increase in weight, lower extremity edema, orthopnea or PND.  She notes that she has had an extremely stressful year and has been hospitalized for mental health concerns.  ROS: .   Today she denies chest pain, lower  extremity edema, fatigue, palpitations, melena, hematuria, hemoptysis, diaphoresis, weakness, presyncope, syncope, orthopnea, and PND.  All other systems are reviewed and otherwise negative. Studies Reviewed: Marland Kitchen    EKG:  EKG is ordered today, personally reviewed, demonstrating  EKG Interpretation Date/Time:  Tuesday January 06 2023 09:32:01 EST Ventricular Rate:  80 PR Interval:  146 QRS Duration:  90 QT Interval:  378 QTC Calculation: 435 R Axis:   52  Text Interpretation: Normal sinus rhythm Possible Left atrial enlargement Nonspecific T wave abnormality No signficant change compared to EKG in May Confirmed by Reather Littler [phone]) on 01/06/2023 9:45:30 AM   CV Studies:  Cardiac Studies & Procedures       ECHOCARDIOGRAM  ECHOCARDIOGRAM COMPLETE 07/19/2021  Narrative ECHOCARDIOGRAM REPORT    Patient Name:   Heather Lucas Date of Exam: 07/19/2021 Medical Rec #:  [id number]      Height:       62.0 in Accession #:    [id number]     Weight:       144.8 lb Date of Birth:  April 15, 1960      BSA:          1.667 m Patient Age:    60 years       BP:           120/90 mmHg Patient Gender: F              HR:           69 bpm. Exam Location:  [address]  Procedure: 2D  Echo, Cardiac Doppler, Color Doppler and Intracardiac Opacification Agent  Indications:    I25.10 Coronary artery disease; Z95.1 S/P CABG  History:        Patient has no prior history of Echocardiogram examinations. Risk Factors:Dyslipidemia and Family History of Coronary Artery Disease. Dyspnea on exertion. Ischemic cardiomyopathy.  Sonographer:    Cathie Beams RCS Referring Phys: [phone] DAVID W HARDING  IMPRESSIONS   1. Left ventricular ejection fraction, by estimation, is 65 to 70%. The left ventricle has normal function. The left ventricle has no regional wall motion abnormalities. Left ventricular diastolic parameters were grossly normal despite abnormal septal and cardiac motion in post operative state. 2.  Right ventricular systolic function is normal. The right ventricular size is normal. Tricuspid regurgitation signal is inadequate for assessing PA pressure. 3. The mitral valve is normal in structure. Trivial mitral valve regurgitation. No evidence of mitral stenosis. 4. The aortic valve is tricuspid. There is mild calcification of the aortic valve. Aortic valve regurgitation is not visualized. Aortic valve sclerosis is present, with no evidence of aortic valve stenosis. 5. The inferior vena cava is normal in size with greater than 50% respiratory variability, suggesting right atrial pressure of 3 mmHg.  FINDINGS Left Ventricle: Left ventricular ejection fraction, by estimation, is 65 to 70%. The left ventricle has normal function. The left ventricle has no regional wall motion abnormalities. The left ventricular internal cavity size was normal in size. There is no left ventricular hypertrophy. Abnormal (paradoxical) septal motion consistent with post-operative status. Left ventricular diastolic parameters were normal.  Right Ventricle: The right ventricular size is normal. No increase in right ventricular wall thickness. Right ventricular systolic function is normal. Tricuspid regurgitation signal is inadequate for assessing PA pressure.  Left Atrium: Left atrial size was normal in size.  Right Atrium: Right atrial size was normal in size.  Pericardium: There is no evidence of pericardial effusion.  Mitral Valve: The mitral valve is normal in structure. Trivial mitral valve regurgitation. No evidence of mitral valve stenosis.  Tricuspid Valve: The tricuspid valve is normal in structure. Tricuspid valve regurgitation is trivial. No evidence of tricuspid stenosis.  Aortic Valve: The aortic valve is tricuspid. There is mild calcification of the aortic valve. Aortic valve regurgitation is not visualized. Aortic valve sclerosis is present, with no evidence of aortic valve stenosis.  Pulmonic  Valve: The pulmonic valve was normal in structure. Pulmonic valve regurgitation is mild. No evidence of pulmonic stenosis.  Aorta: The aortic root is normal in size and structure.  Venous: The inferior vena cava is normal in size with greater than 50% respiratory variability, suggesting right atrial pressure of 3 mmHg.  IAS/Shunts: No atrial level shunt detected by color flow Doppler.   LEFT VENTRICLE PLAX 2D LVIDd:         3.90 cm   Diastology LVIDs:         2.10 cm   LV e' medial:    8.05 cm/s LV PW:         0.80 cm   LV E/e' medial:  8.3 LV IVS:        1.00 cm   LV e' lateral:   15.40 cm/s LVOT diam:     2.00 cm   LV E/e' lateral: 4.3 LV SV:         63 LV SV Index:   38 LVOT Area:     3.14 cm   RIGHT VENTRICLE RV Basal diam:  2.10 cm RV S prime:  4.78 cm/s  LEFT ATRIUM             Index        RIGHT ATRIUM          Index LA diam:        3.20 cm 1.92 cm/m   RA Area:     9.96 cm LA Vol (A2C):   25.5 ml 15.30 ml/m  RA Volume:   18.80 ml 11.28 ml/m LA Vol (A4C):   18.1 ml 10.86 ml/m LA Biplane Vol: 21.4 ml 12.84 ml/m AORTIC VALVE LVOT Vmax:   112.00 cm/s LVOT Vmean:  61.700 cm/s LVOT VTI:    0.199 m  AORTA Ao Root diam: 3.00 cm Ao Asc diam:  2.90 cm  MITRAL VALVE MV Area (PHT): 5.66 cm    SHUNTS MV Decel Time: 134 msec    Systemic VTI:  0.20 m MV E velocity: 66.90 cm/s  Systemic Diam: 2.00 cm MV A velocity: 67.40 cm/s MV E/A ratio:  0.99  Weston Brass MD Electronically signed by Weston Brass MD Signature Date/Time: 07/19/2021/10:01:01 PM    Final     CT SCANS  CT CORONARY MORPH W/CTA COR W/SCORE 10/22/2017  Addendum 10/26/2017  9:07 AM ADDENDUM REPORT: 10/23/2017 12:39  EXAM: Cardiac/Coronary  CT  TECHNIQUE: The patient was scanned on a [employer].  FINDINGS: A 120 kV prospective scan was triggered in the descending thoracic aorta at 111 HU's. Axial non-contrast 3 mm slices were carried out through the heart. The data set  was analyzed on a dedicated work station and scored using the Agatson method. Gantry rotation speed was 250 msecs and collimation was .6 mm. No beta blockade and 0.8 mg of sl NTG was given. The 3D data set was reconstructed in 5% intervals of the 67-82 % of the R-R cycle. Diastolic phases were analyzed on a dedicated work station using MPR, MIP and VRT modes. The patient received 80 cc of contrast.  Aorta:  Normal size.  No calcifications.  No dissection.  Aortic Valve:  Trileaflet.  No calcifications.  Coronary Arteries:  Normal coronary origin.  Right dominance.  RCA is a large dominant artery that gives rise to PDA and PLVB. There is mild (0-25%), non-obstructing soft plaque in the mid RCA.  Left main is a large artery that gives rise to LAD and LCX arteries There is mild (0-25%), non-obstructive calcified plaque.  LAD is a large vessel that has no plaque. There is a normal size D1 and small D2.  LCX is a non-dominant artery that gives rise to OM1 and OM2 branches. There is mild (0-25%), non-obstructive calcified plaque in the proximal LCX.  Other findings:  Normal pulmonary vein drainage into the left atrium.  Normal let atrial appendage without a thrombus.  Normal size of the pulmonary artery.  IMPRESSION: 1. Coronary calcium score of 148. This was 94th percentile for age and sex matched control.  2. Normal coronary origin with right dominance.  3. Mild, non-obstructive CAD.  4. Recommend aggressive medical management of coronary artery disease.  Chilton Si, MD   Electronically Signed By: Chilton Si On: 10/23/2017 12:39  Narrative EXAM: OVER-READ INTERPRETATION  CT CHEST  The following report is an over-read performed by radiologist Dr. Charlett Nose of [facility] on 10/22/2017. This over-read does not include interpretation of cardiac or coronary anatomy or pathology. The coronary CTA interpretation by the cardiologist  is attached.  COMPARISON:  08/12/2017  FINDINGS: Vascular: Heart is normal size.  Visualized aorta is normal caliber.  Mediastinum/Nodes: No adenopathy in the lower mediastinum or hila.  Lungs/Pleura: Visualized lungs clear.  No effusions.  Upper Abdomen: Imaging into the upper abdomen shows no acute findings.  Musculoskeletal: Chest wall soft tissues are unremarkable. No acute bony abnormality.  IMPRESSION: No acute or significant extracardiac abnormality.  Electronically Signed: By: Charlett Nose M.D. On: 10/22/2017 11:05   CT SCANS  CT CARDIAC SCORING (SELF PAY ONLY) 08/12/2017  Narrative CLINICAL DATA:  Hyperlipidemia  EXAM: CT HEART FOR CALCIUM SCORING  TECHNIQUE: CT heart was performed on a 64 channel system using prospective ECG gating.  A non-contrast exam for calcium scoring was performed.  Note that this exam targets the heart and the chest was not imaged in its entirety.  COMPARISON:  None.  FINDINGS: Technical quality: Good.  CORONARY CALCIUM  Total Agatston Score: 125 with calcifications noted in the left main, left anterior descending, and right coronary arteries.  MESA database percentile:  12  OTHER FINDINGS:  Cardiovascular: Heart is normal size. Visualized aorta is normal caliber.  Mediastinum/Nodes: No adenopathy in the lower mediastinum or hila.  Lungs/Pleura: Visualized lungs clear.  No effusions.  Upper Abdomen: Imaging into the upper abdomen shows no acute findings.  Musculoskeletal: Chest wall soft tissues are unremarkable. No acute bony abnormality.  IMPRESSION: The observed calcium score of 125 is at the percentile 93 for subjects of the same age, gender and race/ethnicity who are free of clinical cardiovascular disease and treated diabetes.  No acute or significant extracardiac abnormality.   Electronically Signed By: Charlett Nose M.D. On: 08/12/2017 14:36           Current Reported Medications:.     Current Meds  Medication Sig   benztropine (COGENTIN) 0.5 MG tablet Take 1 tablet (0.5 mg total) by mouth 2 (two) times daily.   benztropine (COGENTIN) 1 MG tablet TAKE ONE TABLET BY MOUTH TWICE DAILY   clopidogrel (PLAVIX) 75 MG tablet Take 1 tablet (75 mg total) by mouth daily.   ezetimibe (ZETIA) 10 MG tablet Take 1 tablet (10 mg total) by mouth daily.   midodrine (PROAMATINE) 2.5 MG tablet Take 2.5 mg by mouth daily.   midodrine (PROAMATINE) 5 MG tablet Take 1 tablet (5 mg total) by mouth daily before breakfast. (Patient taking differently: Take 5 mg by mouth in the morning.)   mirtazapine (REMERON) 7.5 MG tablet Take 1 tablet (7.5 mg total) by mouth at bedtime.   mupirocin cream (BACTROBAN) 2 % Apply topically 2 (two) times daily.   mupirocin ointment (BACTROBAN) 2 % Apply 1 Application topically 2 (two) times daily.   rosuvastatin (CRESTOR) 20 MG tablet Take 1 tablet (20 mg total) by mouth daily.   Physical Exam:    VS:  BP 122/70 (BP Location: Left Arm, Patient Position: Sitting, Cuff Size: Normal)   Pulse 80   Ht 5\' 1"  (1.549 m)   Wt 108 lb (49 kg)   BMI 20.41 kg/m    Wt Readings from Last 3 Encounters:  01/06/23 108 lb (49 kg)  10/07/22 109 lb 8 oz (49.7 kg)  09/21/22 108 lb 8 oz (49.2 kg)    GEN: Well nourished, well developed in no acute distress NECK: No JVD; No carotid bruits CARDIAC: RRR, no murmurs, rubs, gallops RESPIRATORY:  Clear to auscultation without rales, wheezing or rhonchi  ABDOMEN: Soft, non-tender, non-distended EXTREMITIES:  No edema; No acute deformity   Asessement and Plan:Marland Kitchen    Dyspnea on exertion/CAD: S/p CABG  x4.  Previous anginal equivalent noted to be increased shortness of breath.  Today patient reports in the last few weeks she has had an ongoing feeling as though she is "breathing harder", she denies chest pain.  She questions if this is related to her increased anxiety and mental health.  She notes that she had to take more frequent breaks  when she was cleaning her house the other week for increased shortness of breath.  Her EKG today indicates sinus rhythm with nonspecific T wave abnormality, no significant change compared to EKG in May.  Given similar symptoms to her prior anginal equivalent will evaluate with a PET stress test, patient is agreeable, see consent below.  Reviewed ED precautions.  Continue Plavix 75 mg daily, Zetia 10 mg daily, Crestor 20 mg daily. Check CBC and BMET.  Informed Consent   Shared Decision Making/Informed Consent The risks [chest pain, shortness of breath, cardiac arrhythmias, dizziness, blood pressure fluctuations, myocardial infarction, stroke/transient ischemic attack, nausea, vomiting, allergic reaction, radiation exposure, metallic taste sensation and life-threatening complications (estimated to be 1 in 10,000)], benefits (risk stratification, diagnosing coronary artery disease, treatment guidance) and alternatives of a cardiac PET stress test were discussed in detail with Heather Lucas and she agrees to proceed.     Hypotension/dizziness: Blood pressure today 122/70, she is not orthostatic on exam.  She notes increased lightheadedness and dizziness with position changes such as when she goes from sitting to laying down or laying down to sitting.  She describes it as the room spinning, this resolves within a few seconds.  Feel that this is likely vertigo in nature, she will continue to monitor her symptoms and notify the office if worsening. Will continue Midodrine 2.5 mg twice daily.   ICM: Post CABG was noted to have reduced EF, her last echocardiogram on 07/19/2021 indicated LVEF of 65 to 70%, LV with normal function, no RWMA, LV diastolic parameters were grossly normal despite abnormal septal and cardiac motion.  There was aortic valve sclerosis present without evidence of aortic valve stenosis. She endorses increased dyspnea on exertion, see above, denies increased weight, lower extremity edema, orthopnea or  pnd.   Hyperlipidemia: Last lipid profile on 07/16/2022 indicated total cholesterol 112, triglycerides 96, HDL 44 and LDL 49.  Continue Crestor 20 mg daily and ezetimibe 10 mg daily.    Disposition: F/u with Reather Littler, NP following PET stress test.   Signed, Rip Harbour, NP

## 2023-01-06 NOTE — Patient Instructions (Addendum)
Medication Instructions:  No changes *If you need a refill on your cardiac medications before your next appointment, please call your pharmacy*   Lab Work: Today we will draw CBC and Bmet If you have labs (blood work) drawn today and your tests are completely normal, you will receive your results only by: MyChart Message (if you have MyChart) OR A paper copy in the mail If you have any lab test that is abnormal or we need to change your treatment, we will call you to review the results.   Testing/Procedures:  We would like for you to get scheduled for Cardiac pet stress test   Follow-Up: At [facility], you and your health needs are our priority.  As part of our continuing mission to provide you with exceptional heart care, we have created designated Provider Care Teams.  These Care Teams include your primary Cardiologist (physician) and Advanced Practice Providers (APPs -  Physician Assistants and Nurse Practitioners) who all work together to provide you with the care you need, when you need it.  We recommend signing up for the patient portal called "MyChart".  Sign up information is provided on this After Visit Summary.  MyChart is used to connect with patients for Virtual Visits (Telemedicine).  Patients are able to view lab/test results, encounter notes, upcoming appointments, etc.  Non-urgent messages can be sent to your provider as well.   To learn more about what you can do with MyChart, go to [email or web address].    Your next appointment:   6 week(s)  Provider:   Bryan Lemma, MD  or Reather Littler, NP

## 2023-01-06 NOTE — Telephone Encounter (Signed)
Pt c/o Shortness Of Breath: STAT if SOB developed within the last 24 hours or pt is noticeably SOB on the phone  1. Are you currently SOB (can you hear that pt is SOB on the phone)? Patient states she is a little bit SOB right now.   2. How long have you been experiencing SOB? For two weeks.   3. Are you SOB when sitting or when up moving around? Both.   4. Are you currently experiencing any other symptoms? Has been having lightheadedness for the past two days.

## 2023-01-06 NOTE — Telephone Encounter (Signed)
Patient identification verified by 2 forms. Marilynn Rail, RN    Called and spoke to patient  Patient states:   -has SOB for a few weeks   -SOB occurs with activity and at rest   -lightheadedness a few days a go  -lightheadedness happens when changing positions -takes midodrine 2.5mg  twice a day   Patient scheduled for OV 11/26 at 9:30am  Patient agrees with plan, no questions at this time

## 2023-01-07 ENCOUNTER — Telehealth: Payer: Self-pay

## 2023-01-07 LAB — CBC
Hematocrit: 43.7 % (ref 34.0–46.6)
Hemoglobin: 13.9 g/dL (ref 11.1–15.9)
MCH: 31 pg (ref 26.6–33.0)
MCHC: 31.8 g/dL (ref 31.5–35.7)
MCV: 97 fL (ref 79–97)
Platelets: 266 10*3/uL (ref 150–450)
RBC: 4.49 x10E6/uL (ref 3.77–5.28)
RDW: 12.2 % (ref 11.7–15.4)
WBC: 6.2 10*3/uL (ref 3.4–10.8)

## 2023-01-07 LAB — BASIC METABOLIC PANEL
BUN/Creatinine Ratio: 19 (ref 12–28)
BUN: 14 mg/dL (ref 8–27)
CO2: 24 mmol/L (ref 20–29)
Calcium: 9.6 mg/dL (ref 8.7–10.3)
Chloride: 105 mmol/L (ref 96–106)
Creatinine, Ser: 0.72 mg/dL (ref 0.57–1.00)
Glucose: 99 mg/dL (ref 70–99)
Potassium: 5 mmol/L (ref 3.5–5.2)
Sodium: 142 mmol/L (ref 134–144)
eGFR: 94 mL/min/{1.73_m2} (ref >59)

## 2023-01-07 NOTE — Telephone Encounter (Signed)
-----   Message from [facility] sent at 01/07/2023  7:51 AM EST ----- Please let Heather Lucas know that her CBC shows no evidence of anemia or infection.  Her kidney function is normal and her electrolytes are normal.  Good results!  Continue with PET stress test as planned.

## 2023-01-07 NOTE — Telephone Encounter (Signed)
Called patient advised of below they verbalized understanding.

## 2023-01-12 ENCOUNTER — Telehealth: Payer: Self-pay | Admitting: Neurology

## 2023-01-12 NOTE — Telephone Encounter (Signed)
I talked with neurologist Dr. Caroleen Hamman (third-party independent  physician evaluating her by her insurance company, she is in the process of applying for disability) at [phone]   Updated him on patient's information based on initial evaluation on October 07, 2022, she was seen for cognitive impairment, MoCA 17/30, missed 5 out of 5 short-term recall, MRI of the brain in June 2024 showed mild small vessel disease, she also has tardive dyskinesia, has follow-up appointment on January 19, 2023

## 2023-01-15 ENCOUNTER — Encounter: Payer: Self-pay | Admitting: Adult Health

## 2023-01-15 ENCOUNTER — Ambulatory Visit: Payer: Medicaid Other | Admitting: Adult Health

## 2023-01-15 DIAGNOSIS — F411 Generalized anxiety disorder: Secondary | ICD-10-CM | POA: Diagnosis not present

## 2023-01-15 DIAGNOSIS — F331 Major depressive disorder, recurrent, moderate: Secondary | ICD-10-CM

## 2023-01-15 DIAGNOSIS — G2401 Drug induced subacute dyskinesia: Secondary | ICD-10-CM | POA: Diagnosis not present

## 2023-01-15 DIAGNOSIS — F410 Panic disorder [episodic paroxysmal anxiety] without agoraphobia: Secondary | ICD-10-CM

## 2023-01-15 DIAGNOSIS — G2571 Drug induced akathisia: Secondary | ICD-10-CM

## 2023-01-15 DIAGNOSIS — G4700 Insomnia, unspecified: Secondary | ICD-10-CM

## 2023-01-15 MED ORDER — MIRTAZAPINE 7.5 MG PO TABS: 7.5000 mg | ORAL_TABLET | Freq: Every day | ORAL | 2 refills | Status: DC

## 2023-01-15 MED ORDER — BENZTROPINE MESYLATE 0.5 MG PO TABS: 0.5000 mg | ORAL_TABLET | Freq: Two times a day (BID) | ORAL | 2 refills | Status: DC

## 2023-01-15 NOTE — Progress Notes (Signed)
Heather Lucas [id number] 29-Jun-1960 62 y.o.  Subjective:   Patient ID:  Heather Lucas is a 62 y.o. (DOB Dec 18, 1960) female.  Chief Complaint: No chief complaint on file.   HPI Heather Lucas presents to the office today for follow-up of MDD, GAD, panic attacks and TD.  08/02/2021 - initial evaluation. Reports she moved back to [place] from [place] where she lived for 2 and 1/2 years. Now living alone in [place]. Has friends in the area and has adjusted to moving back to the area. While living in [place] was seen by a telemedicine provider and was started on Abilify 20mg  and Olanzapine 7.5mg . She was also taking Trintellix when she initially moved to [place] and had Xanax as needed. Reports feeling more anxious and a little depressed after the move. Mostly staying home - works from home and does not get out much. Felt like she was having some adjustment issues and reached out to a tele health company for assistance. Her medications at initial visit were: Lexapro 10mg  daily, Abilify 10mg  daily,Zyprexa 7.5mg  at hs and Xanax 0.5mg  daily. Reports TD and akathisia with that medication regimen and felt very uncomfortable. Reports peri oral mouth movements, legs shaking, hands shaking, left hand shaking started all which started after initiating antipsychotic medications.  Describes mood today as "a little bit of improvement". Pleasant. Flat affect. Denies tearfulness. Mood symptoms - reports decreased depression. Reports feeling anxious. Denies irritability. Reports panic attacks - "feeling short of breath". Reports worry, rumination, and over thinking. Reports mood is improving. Stating "I feel like I'm making a little progress". Attends the [facility] for counseling - 1 day a week. Denies recent falls.  Feels like current medications are helpful - taking Remeron 7.5mg  daily at bedtime and Cogentin 0.5mg  twice daily. Reports varying interest and motivation. Taking medications as prescribed.  Energy  levels low. Active, does not have a regular exercise routine. Reports walking some days. Does not enjoy usual interests and activities - "I'm trying to figure that out". Single. Lives alone. Brother in [place]. Friends local. Attends church. Appetite adequate - "I'm trying to eat more". Weight gain - 102 to 103.8 pounds - 61". Discussed weight loss and increasing calorie intake. Sleeps well at night. Averages 10 to 12 hours.   Reports difficulties with focus and concentration. Reports being forgetful - "getting a little better". Reports having to make notes to remember what she needs to do. Is unable to work at this time due to mood symptoms, TD and akthesia symptoms. Completing minimal tasks. Managing some aspects of household. 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 Has applied for LTD benefits. Patient totally disabled and unable to work. Denies SI and HI. Denies AH or AH. Denies self harm. Denies substance use.  Previous medication trials:  Zoloft, Prozac, Wellbutrin, Lexapro, Trintellix, Hydroxyzine.    AIMS    Flowsheet Row Admission (Discharged) from 09/21/2022 in [facility]AL [facility] ED from 09/20/2022 in [facility] Emergency Department at [facility] Office Visit from 12/13/2021 in [facility]  AIMS Total Score 14 14 28       AUDIT    Flowsheet Row Admission (Discharged) from 09/21/2022 in [facility] BEHAVIORAL MEDICINE  Alcohol Use Disorder Identification Test Final Score (AUDIT) 0      Mini-Mental    Flowsheet Row Office Visit from 07/30/2022 in [facility]  Total Score (max 30 points ) 30  Flowsheet Row Admission (Discharged) from 09/21/2022 in [facility] BE[facility]AVIORAL MEDICINE ED from 09/20/2022 in [facility] Emergency Department at [facility] ED from 09/18/2022 in [facility] Department at  [facility]  C-SSRS RISK CATEGORY Moderate Risk Moderate Risk No Risk        Review of Systems:  Review of Systems  Musculoskeletal:  Negative for gait problem.  Neurological:  Negative for tremors.  Psychiatric/Behavioral:         Please refer to HPI    Medications: I have reviewed the patient's current medications.  Current Outpatient Medications  Medication Sig Dispense Refill   benztropine (COGENTIN) 0.5 MG tablet Take 1 tablet (0.5 mg total) by mouth 2 (two) times daily. 60 tablet 0   benztropine (COGENTIN) 1 MG tablet TAKE ONE TABLET BY MOUTH TWICE DAILY 60 tablet 0   clopidogrel (PLAVIX) 75 MG tablet Take 1 tablet (75 mg total) by mouth daily. 30 tablet 0   ezetimibe (ZETIA) 10 MG tablet Take 1 tablet (10 mg total) by mouth daily. 30 tablet 0   midodrine (PROAMATINE) 2.5 MG tablet Take 2.5 mg by mouth daily.     midodrine (PROAMATINE) 5 MG tablet Take 1 tablet (5 mg total) by mouth daily before breakfast. (Patient taking differently: Take 5 mg by mouth in the morning.) 30 tablet 0   mirtazapine (REMERON) 7.5 MG tablet Take 1 tablet (7.5 mg total) by mouth at bedtime. 30 tablet 0   mupirocin cream (BACTROBAN) 2 % Apply topically 2 (two) times daily. 15 g 0   mupirocin ointment (BACTROBAN) 2 % Apply 1 Application topically 2 (two) times daily.     rosuvastatin (CRESTOR) 20 MG tablet Take 1 tablet (20 mg total) by mouth daily. 30 tablet 0   No current facility-administered medications for this visit.    Medication Side Effects: None  Allergies: No Known Allergies  Past Medical History:  Diagnosis Date   Anxiety    Borderline hyperlipidemia    as of 10/2016: TC 134, TG 90, HDL 51, LDL 95 (PCP recently increase rosuvastatin dose from 10 to 20 mg)   Borderline hypertension    Coronary artery disease involving native coronary artery of native heart without angina pectoris 03/01/2020   [facility]:; Dr. Adin Hector:   multivessel CAD => 50% pLAD & 60% mLAD, 70% pLCx & 90% mLCx,  70% mid RCA => CABG x 4   Coronary artery disease with history of myocardial infarction without history of CABG    Depression    Family history of premature coronary artery disease 08/26/2017   Hx of CABG x 4 03/19/2020   [facility], [place], [place] -> CABG x4 (LIMA-LAD, SVG-OM1, SVG-OM2, SVG-dRCA   Hyperlipidemia due to dietary fat intake 08/26/2017    Past Medical History, Surgical history, Social history, and Family history were reviewed and updated as appropriate.   Please see review of systems for further details on the patient's review from today.   Objective:   Physical Exam:  There were no vitals taken for this visit.  Physical Exam Constitutional:      General: She is not in acute distress. Musculoskeletal:        General: No deformity.  Neurological:     Mental Status: She is alert and oriented to person, place, and time.     Coordination: Coordination normal.  Psychiatric:        Attention and Perception: Attention and perception normal. She does  not perceive auditory or visual hallucinations.        Mood and Affect: Affect is not labile, blunt, angry or inappropriate.        Speech: Speech normal.        Behavior: Behavior normal.        Thought Content: Thought content normal. Thought content is not paranoid or delusional. Thought content does not include homicidal or suicidal ideation. Thought content does not include homicidal or suicidal plan.        Cognition and Memory: Cognition and memory normal.        Judgment: Judgment normal.     Comments: Insight intact     Lab Review:     Component Value Date/Time   NA 142 01/06/2023 1029   K 5.0 01/06/2023 1029   CL 105 01/06/2023 1029   CO2 24 01/06/2023 1029   GLUCOSE 99 01/06/2023 1029   GLUCOSE 99 09/20/2022 1545   BUN 14 01/06/2023 1029   CREATININE 0.72 01/06/2023 1029   CALCIUM 9.6 01/06/2023 1029   PROT 7.7 09/20/2022 1545    PROT 6.0 03/03/2022 0833   ALBUMIN 4.6 09/20/2022 1545   ALBUMIN 4.1 03/03/2022 0833   AST 30 09/20/2022 1545   ALT 21 09/20/2022 1545   ALKPHOS 69 09/20/2022 1545   BILITOT 0.7 09/20/2022 1545   BILITOT 0.5 03/03/2022 0833   GFRNONAA >60 09/20/2022 1545   GFRAA 95 10/16/2017 1412       Component Value Date/Time   WBC 6.2 01/06/2023 1029   WBC 11.2 (H) 09/20/2022 1545   RBC 4.49 01/06/2023 1029   RBC 4.41 09/20/2022 1545   HGB 13.9 01/06/2023 1029   HCT 43.7 01/06/2023 1029   PLT 266 01/06/2023 1029   MCV 97 01/06/2023 1029   MCH 31.0 01/06/2023 1029   MCH 30.8 09/20/2022 1545   MCHC 31.8 01/06/2023 1029   MCHC 33.3 09/20/2022 1545   RDW 12.2 01/06/2023 1029   LYMPHSABS 0.9 09/18/2022 1902   MONOABS 0.7 09/18/2022 1902   EOSABS 0.0 09/18/2022 1902   BASOSABS 0.0 09/18/2022 1902    No results found for: "POCLITH", "LITHIUM"   No results found for: "PHENYTOIN", "PHENOBARB", "VALPROATE", "CBMZ"   .res Assessment: Plan:    Plan:  PDMP reviewed  Continue Remeron 7.5 MG tablet - depression/anxiety  Cogentin 0.5mg  BID - movement disorder.     Discussed medications and treatment plans with patient support. Patient totally disabled and unable to work at this time. Patient receiving long term disability benefits.   RTC 4 weeks.  Patient advised to contact office with any questions, adverse effects, or acute worsening in signs and symptoms.   Discussed potential benefits, risk, and side effects of benzodiazepines to include potential risk of tolerance and dependence, as well as possible drowsiness. Advised patient not to drive if experiencing drowsiness and to take lowest possible effective dose to minimize risk of dependence and tolerance.   There are no diagnoses linked to this encounter.   Please see After Visit Summary for patient specific instructions.  Future Appointments  Date Time Provider Department Center  01/15/2023  5:00 PM Namon Villarin, Thereasa Solo, NP  [facility] None  01/19/2023  1:00 PM Levert Feinstein, MD [facility] None  02/12/2023  2:00 PM Waldron Session, [facility] CP-CP None  03/04/2023  9:20 AM [facility] PET CT 1 [facility] [place]  03/12/2023  1:30 PM Reather Littler D, NP [facility] None    No orders of the defined types were placed in this encounter.   -------------------------------

## 2023-01-19 ENCOUNTER — Ambulatory Visit: Payer: Medicaid Other | Admitting: Neurology

## 2023-01-19 ENCOUNTER — Encounter: Payer: Self-pay | Admitting: Neurology

## 2023-01-19 VITALS — BP 138/60 | HR 80 | Ht 61.0 in | Wt 110.0 lb

## 2023-01-19 DIAGNOSIS — R259 Unspecified abnormal involuntary movements: Secondary | ICD-10-CM | POA: Diagnosis not present

## 2023-01-19 DIAGNOSIS — F32A Depression, unspecified: Secondary | ICD-10-CM

## 2023-01-19 DIAGNOSIS — G2401 Drug induced subacute dyskinesia: Secondary | ICD-10-CM

## 2023-01-19 NOTE — Progress Notes (Signed)
Chief Complaint  Patient presents with   Follow-up    Rm12, alone, pt stated that she seems to be doing better with arm and leg movement. She is being seen for Abnormal movement Tardive dyskinesia.       ASSESSMENT AND PLAN  Heather Lucas is a 62 y.o. female   Tardive dyskinesia Depression anxiety  Tardive dyskinesia first noted around 2023, when she was treated with large dose of Abilify 20 mg daily, olanzapine 7.5 mg daily,   Worsening abnormal movement in the setting of multiple medication changes, worsening mood disorder,  Previously tried Ingrezza without helping her abnormal movement, now on low-dose Cogentin 0.5 mg twice a day, reported her abnormal movement is under good control,  Cognitive impairment   Fluctuation of MoCA scores over short period of time, today's 21/30, mainly involving executive function, in the setting of continued anxiety,  MRI of the brain in June 2024 showed mild age-related changes mild small vessel disease no acute abnormality  Laboratory evaluation previously showed no treatable etiology including normal TSH, B12,  Her suboptimal control of mood disorder certainly contributed to her complaints of memory loss,   Continue to work with psychiatrist, only return to clinic for worsening issues.  DIAGNOSTIC DATA (LABS, IMAGING, TESTING) - I reviewed patient records, labs, notes, testing and imaging myself where available.   MEDICAL HISTORY:  Heather Lucas is a 62 year old female accompanied by her friend, seen in request by [facility] psychiatrist nurse practitioner   Kallie Locks, Thereasa Solo, for evaluation of abnormal movement, her primary care physician is Dr. Clelia Croft, Netta Corrigan. from [facility], initial evaluation on October 09, 2018 for  I reviewed and summarized the referring note.PMHX. HLD Chronic insomnia Major Depression, anxiety, panic attack CAD, s/p CABG  Reviewed extensive note from psychologist, patient had long  history of depression anxiety, functioning very well for many years, she moved to [place] for 2 years to be close to her family, then lost her job, moved back to [place] again in January 2023,  During her [place] stay, she was put on a different medications for her mood disorder, per note, including olanzapine 7.5 mg, Abilify 20 mg, she was originally taking Trintellix moving to [place], Xanax as needed  During her stay at [place], with the stress of moving, isolations, she reached out to a telehealth company for assistant, with the drastic change of the medication mentioned above.  During her initial visit with psychiatrist in June 2023, she reported abnormal mouth movement, hand shaking, was diagnosed with a deep dyskinesia, there was no major medication change since then, Abilify 5 mg daily, Zyprexa 7.5 mg half tablet daily, Xanax 0.5 mg twice daily  Around September 2023, medication was switched to Remeron 15 mg daily, also add on Cogentin 0.5 mg twice a day, then add on Ingrezza 30 mg daily,  By January 2024, Ingrezza titrating to 80 mg daily, office note also documented much less tardive dyskinesia movement, Wellbutrin XL 300 mg every morning was added on, also clonazepam 1 mg at bedtime  Mental health admission in June 2020 for for woresening major depression anxiety, deep dyskinesia, adding on great frustration,  Admission again in August 2020 for for worsening depression, panic attack, tardive dyskinesia, suicidal ideation, she was giving a trial of Cogentin 1 mg twice a day for her tardive dyskinesia,  She now complains of mental cloudiness, memory loss, almost constant mouth chewing movement, body jerking movement  When she first moved back to [place], 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 for hospital admission for her worsening mood disorder  UPDATE Jan 19 2023: She reported 75% improvement, drove herself to clinic today, still dealing with  a lot of anxiety under the care of [facility] psychologist, under long-term care insurance, applying for social disability, she lives alone,  She no longer have significant abnormal limb muscle movement, there are almost constant facial grimace, chewing movement, tongue protruding, but patient stated she does not self aware of her abnormal  facial movement, no limitation in her daily activity, she does not want to try Ingrezza anymore, stating is because more side effect, now taking Cogentin 0.5 mg twice a day, which was helpful,  I talked with neurologist Dr. Caroleen Hamman (third-party independent  physician evaluating her by her insurance company, she is in the process of applying for disability) at [phone]    PHYSICAL EXAM:   Vitals:   01/19/23 1302  BP: 138/60  Pulse: 80  Weight: 110 lb (49.9 kg)  Height: 5\' 1"  (1.549 m)   Body mass index is 20.78 kg/m.  PHYSICAL EXAMNIATION:  Gen: NAD, conversant, well nourised, well groomed                     Cardiovascular: Regular rate rhythm, no peripheral edema, warm, nontender. Eyes: Conjunctivae clear without exudates or hemorrhage Neck: Supple, no carotid bruits. Pulmonary: Clear to auscultation bilaterally   NEUROLOGICAL EXAM:  MENTAL STATUS: Speech/cognition: Anxious looking middle-aged female, constant most abnormal facial movement, grimace, chewing, tongue protrusion       01/19/2023    1:22 PM 10/07/2022    3:46 PM  Montreal Cognitive Assessment   Visuospatial/ Executive (0/5) 0 1  Naming (0/3) 3 3  Attention: Read list of digits (0/2) 2 2  Attention: Read list of letters (0/1) 1 1  Attention: Serial 7 subtraction starting at 100 (0/3) 3 3  Language: Repeat phrase (0/2) 1 0  Language : Fluency (0/1) 0 0  Abstraction (0/2) 2 1  Delayed Recall (0/5) 3 0  Orientation (0/6) 6 6  Total 21 17    CRANIAL NERVES: CN II: Visual fields are full to confrontation. Pupils are round equal and briskly reactive to light. CN III, IV,  VI: extraocular movement are normal. No ptosis. CN V: Facial sensation is intact to light touch CN VII: Face is symmetric with normal eye closure  CN VIII: Hearing is normal to causal conversation. CN IX, X: Phonation is normal. CN XI: Head turning and shoulder shrug are intact  MOTOR: There is no pronator drift of out-stretched arms. Muscle bulk and tone are normal. Muscle strength is normal.  REFLEXES: Reflexes are 2+ and symmetric at the biceps, triceps, knees, and ankles. Plantar responses are flexor.  SENSORY: Intact to light touch, pinprick and vibratory sensation are intact in fingers and toes.  COORDINATION: There is no trunk or limb dysmetria noted.  GAIT/STANCE: Posture is normal. Gait is steady with normal steps, base, arm swing, and turning. Heel and toe walking are normal. Tandem gait is normal.  Romberg is absent.  REVIEW OF SYSTEMS:  Full 14 system review of systems performed and notable only for as above All other review of systems were negative.   ALLERGIES: No Known Allergies  HOME MEDICATIONS: Current Outpatient Medications  Medication Sig Dispense Refill   benztropine (COGENTIN) 0.5 MG tablet Take 1 tablet (0.5 mg total) by mouth 2 (two) times daily. 60 tablet 2  clopidogrel (PLAVIX) 75 MG tablet Take 1 tablet (75 mg total) by mouth daily. 30 tablet 0   ezetimibe (ZETIA) 10 MG tablet Take 1 tablet (10 mg total) by mouth daily. 30 tablet 0   midodrine (PROAMATINE) 2.5 MG tablet Take 2.5 mg by mouth 2 (two) times daily with a meal.     mirtazapine (REMERON) 7.5 MG tablet Take 1 tablet (7.5 mg total) by mouth at bedtime. 30 tablet 2   mupirocin cream (BACTROBAN) 2 % Apply topically 2 (two) times daily. 15 g 0   rosuvastatin (CRESTOR) 20 MG tablet Take 1 tablet (20 mg total) by mouth daily. 30 tablet 0   No current facility-administered medications for this visit.    PAST MEDICAL HISTORY: Past Medical History:  Diagnosis Date   Anxiety    Borderline  hyperlipidemia    as of 10/2016: TC 134, TG 90, HDL 51, LDL 95 (PCP recently increase rosuvastatin dose from 10 to 20 mg)   Borderline hypertension    Coronary artery disease involving native coronary artery of native heart without angina pectoris 03/01/2020   [facility]:; Dr. Adin Hector:  multivessel CAD => 50% pLAD & 60% mLAD, 70% pLCx & 90% mLCx,  70% mid RCA => CABG x 4   Coronary artery disease with history of myocardial infarction without history of CABG    Depression    Family history of premature coronary artery disease 08/26/2017   Hx of CABG x 4 03/19/2020   [facility], [place], [place] -> CABG x4 (LIMA-LAD, SVG-OM1, SVG-OM2, SVG-dRCA   Hyperlipidemia due to dietary fat intake 08/26/2017    PAST SURGICAL HISTORY: Past Surgical History:  Procedure Laterality Date   CORONARY ARTERY BYPASS GRAFT  03/2020   [facility], [place], [place] -> reportedly CABG x4   GXT/ETT  02/23/2020   From [facility]:: 9:30 min (Stage 3). 10.8 METS 80% MPHR; NO CP but + DOE. -> 1-2 mm Inf De[pression & AVR STE = c/w Ischemia   LAPAROSCOPIC CHOLECYSTECTOMY  1995   LEFT HEART CATH AND CORONARY ANGIOGRAPHY  03/01/2020   [facility] ([facility]; Dr. Adin Hector:  multivessel CAD => 50% pLAD & 60% mLAD, 70% pLCx & 90% mLCx,  70% mid RCA => CABG   NM MYOVIEW LTD  02/2018   Clearly appears abnormal, referred for Apolinar Junes, [place]   TRANSTHORACIC ECHOCARDIOGRAM  02/16/2020   [facility]:  a) Echo 02/16/2020: EF 60% with mild MR and TR.; b) Post Op Limited Echo: EF 55-60%.  Normal wall motion.  Normal RV.  Mild RA dilation.  No pericardial effusion.  Left pleural effusion noted.   TRANSTHORACIC ECHOCARDIOGRAM  07/2021   ([facility])  EF 65 to 70%.  No RWMA other than possible septal motion.   Mild MAC.  Aortic sclerosis.  Otherwise normal.    FAMILY HISTORY: Family History  Problem Relation Age of Onset   Heart attack Mother 27       Recently died; age 42.   Cancer Father    Heart attack Brother    CAD Brother    Heart attack Maternal Grandmother    Diabetes Maternal Grandmother    Colon cancer Paternal Grandmother    Other Paternal Grandfather        Circulatory problems    SOCIAL HISTORY: Social History   Socioeconomic History   Marital status: Divorced  Spouse name: Not on file   Number of children: Not on file   Years of education: 12   Highest education level: High school graduate  Occupational History    Employer: [employer]    Comment: Works doing Paramedic  Tobacco Use   Smoking status: Never   Smokeless tobacco: Never  Substance and Sexual Activity   Alcohol use: Not Currently   Drug use: Never   Sexual activity: Not Currently  Other Topics Concern   Not on file  Social History Narrative   She is single.  Lives alone.    Does not routinely exercise      About 3 years ago, she moved down to [place], [place] (in the [place]) delivered with her brother after being born and raised in [place].   Social Determinants of Health   Financial Resource Strain: Not on file  Food Insecurity: No Food Insecurity (09/21/2022)   Hunger Vital Sign    Worried About Running Out of Food in the Last Year: Never true    Ran Out of Food in the Last Year: Never true  Transportation Needs: No Transportation Needs (09/21/2022)   PRAPARE - Administrator, Civil Service (Medical): No    Lack of Transportation (Non-Medical): No  Physical Activity: High Risk (04/15/2019)   Received from [facility]   Physical Activity    How often do you engage in moderate physical activity for 30 minutes or more?: Never    How often do you engage in vigorous physical activity for 20 minutes or more?: Never    How many hours per day do you spend  sitting?: More than 8 hours  Stress: Low Risk  (03/06/2021)   Received from [facility]   Stress    Stress in your Life: Not on file    Dealing with Stress: Not on file  Social Connections: Not on file  Intimate Partner Violence: Not At Risk (09/21/2022)   Humiliation, Afraid, Rape, and Kick questionnaire    Fear of Current or Ex-Partner: No    Emotionally Abused: No    Physically Abused: No    Sexually Abused: No      Levert Feinstein, M.D. Ph.D.  [facility] [address] [place], [place] [address] Ph: [phone] Fax: [phone]  CC:  Cleatis Polka., MD [address] [place],  [place] [address]  Cleatis Polka., MD

## 2023-01-20 ENCOUNTER — Telehealth: Payer: Self-pay

## 2023-01-20 NOTE — Telephone Encounter (Signed)
Confirmatory letter faxed inregards to claim number [id number]

## 2023-01-29 ENCOUNTER — Telehealth: Payer: Self-pay | Admitting: Adult Health

## 2023-01-29 NOTE — Telephone Encounter (Signed)
The last documentation I see is company asking for records from September to present. It looks like they were sent 11/8.  If you need me to make some phone calls let me know.

## 2023-01-29 NOTE — Telephone Encounter (Signed)
Next visit is 02/13/23. Heather Lucas called and states her long term disability was cancelled on 01/28/23. She would like to see if Heather Lucas could write a letter to appeal her request. The claim number is [id number]. Insurance company needs documentation when the claim was cancelled  and why. Please call Heather Lucas when ready at [phone].

## 2023-02-01 NOTE — Telephone Encounter (Signed)
Pt saw Almira Coaster 12/05 so I am not sure if anything was discussed at that time. I am not aware of receiving any other paperwork.

## 2023-02-02 NOTE — Telephone Encounter (Signed)
Heather Lucas called this morning to check on status of letter to appeal her disability.  I told her it was not done at this time and that we are out of the office the rest of week and it would not be done until we return.  There is not any paperwork to complete.  She is just a letter of appeal documenting the fact that she will be disabled and not able to work at all.  They told her the records did not substantiate her disability.

## 2023-02-02 NOTE — Telephone Encounter (Signed)
Allishia called and states her long term disability was cancelled on 01/28/23. She would like to see if Almira Coaster could write a letter to appeal her request. The claim number is [id number]. Insurance company needs documentation when the claim was cancelled and why.   [name] said she is not aware of any paperwork and not sure if you discussed anything with her at last visit.

## 2023-02-12 ENCOUNTER — Ambulatory Visit: Payer: Medicaid Other | Admitting: Mental Health

## 2023-02-13 ENCOUNTER — Ambulatory Visit: Payer: Medicaid Other | Admitting: Adult Health

## 2023-02-13 ENCOUNTER — Encounter: Payer: Self-pay | Admitting: Adult Health

## 2023-02-13 DIAGNOSIS — F410 Panic disorder [episodic paroxysmal anxiety] without agoraphobia: Secondary | ICD-10-CM

## 2023-02-13 DIAGNOSIS — G2401 Drug induced subacute dyskinesia: Secondary | ICD-10-CM | POA: Diagnosis not present

## 2023-02-13 DIAGNOSIS — F331 Major depressive disorder, recurrent, moderate: Secondary | ICD-10-CM | POA: Diagnosis not present

## 2023-02-13 DIAGNOSIS — F411 Generalized anxiety disorder: Secondary | ICD-10-CM | POA: Diagnosis not present

## 2023-02-13 DIAGNOSIS — G4700 Insomnia, unspecified: Secondary | ICD-10-CM

## 2023-02-13 NOTE — Progress Notes (Signed)
 Heather Lucas [id number] 1960-07-24 63 y.o.  Subjective:   Patient ID:  Heather Lucas is a 63 y.o. (DOB 1960/08/09) female.  Chief Complaint: No chief complaint on file.   HPI Heather Lucas presents to the office today for follow-up of MDD, GAD, panic attacks and TD.  Describes mood today as mediocre. Pleasant. Flat affect. Denies tearfulness. Mood symptoms - reports decreased depression - some with recent situational stressors. Reports increased anxiety. Denies irritability. Reports panic attacks - shortness of breath. Reports worry, rumination, and over thinking. Denies obsessive thoughts. Reports mood as as about the same.Stating I feel like I'm doing fair. Attends the [facility] for counseling - 1 day a week. Denies recent falls.  Feels like current medications are helpful - taking Remeron  7.5mg  daily at bedtime and Cogentin  0.5mg  twice daily and feels like they are helpful. Reports varying interest and motivation. Taking medications as prescribed.  Energy levels low. Active, does not have a regular exercise routine.  Enjoys some usual interests and activities. Single. Lives alone. Getting out occasionally. Brother in [place] . Friends local. Attends church. Appetite adequate - my appetite is medium. Weight gain - 103.8 to 107 pounds - 61. Discussed weight loss and increasing calorie intake. Sleeps well at night. Averages 10 to 12 hours.   Reports ongoing difficulties with focus and concentration. Reports being forgetful - my memory doesn't work quite right. Reports having to make notes to keep up with things. Does not feel like she is able to return to work at this time with current mood symptoms, TD and akthesia symptoms. Completing minimal tasks at home - only what I have too. Managing some aspects of household - I'm trying, but things aren't like they used to be. 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  Has applied for LTD benefits. Patient totally disabled and unable to work. Denies SI and HI. Denies AH or AH. Denies self harm. Denies substance use.  Previous medication trials:  Zoloft, Prozac, Wellbutrin , Lexapro, Trintellix , Hydroxyzine .    AIMS    Flowsheet Row Admission (Discharged) from 09/21/2022 in [facility] BE[facility]AVIORAL MEDICINE ED from 09/20/2022 in [facility] Emergency Department at [facility] Office Visit from 12/13/2021 in [facility]  AIMS Total Score 14 14 28       AUDIT    Flowsheet Row Admission (Discharged) from 09/21/2022 in [facility]INE  Alcohol  Use Disorder Identification Test Final Score (AUDIT) 0      Mini-Mental    Flowsheet Row Office Visit from 07/30/2022 in [facility]  Total Score (max 30 points ) 30      Flowsheet Row Admission (Discharged) from 09/21/2022 in [facility] [place] [facility] BEHAVIORAL MEDICINE ED from 09/20/2022 in [facility] Emergency Department at [facility] ED from 09/18/2022 in [facility] Emergency Department at [facility]  C-SSRS RISK CATEGORY Moderate Risk Moderate Risk No Risk        Review of Systems:  Review of Systems  Musculoskeletal:  Negative for gait problem.  Neurological:  Negative for tremors.  Psychiatric/Behavioral:         Please refer to HPI    Medications: I have reviewed the patient's current medications.  Current Outpatient Medications  Medication Sig Dispense Refill   benztropine  (COGENTIN ) 0.5 MG tablet Take 1 tablet (0.5 mg total) by mouth 2 (two) times daily. 60 tablet 2   clopidogrel  (PLAVIX ) 75 MG tablet Take 1 tablet (  75 mg total) by mouth daily. 30 tablet 0   ezetimibe  (ZETIA ) 10 MG tablet Take 1 tablet (10 mg total) by mouth daily. 30 tablet 0   midodrine  (PROAMATINE ) 2.5 MG tablet Take 2.5 mg by mouth 2 (two) times daily with a meal.     mirtazapine  (REMERON ) 7.5 MG tablet Take 1 tablet (7.5 mg  total) by mouth at bedtime. 30 tablet 2   mupirocin  cream (BACTROBAN ) 2 % Apply topically 2 (two) times daily. 15 g 0   rosuvastatin  (CRESTOR ) 20 MG tablet Take 1 tablet (20 mg total) by mouth daily. 30 tablet 0   No current facility-administered medications for this visit.    Medication Side Effects: None  Allergies: No Known Allergies  Past Medical History:  Diagnosis Date   Anxiety    Borderline hyperlipidemia    as of 10/2016: TC 134, TG 90, HDL 51, LDL 95 (PCP recently increase rosuvastatin  dose from 10 to 20 mg)   Borderline hypertension    Coronary artery disease involving native coronary artery of native heart without angina pectoris 03/01/2020   [facility] [place]  [facility]:; Dr. Clerance:  multivessel CAD => 50% pLAD & 60% mLAD, 70% pLCx & 90% mLCx,  70% mid RCA => CABG x 4   Coronary artery disease with history of myocardial infarction without history of CABG    Depression    Family history of premature coronary artery disease 08/26/2017   Hx of CABG x 4 03/19/2020   [facility], [place], [place]  -> CABG x4 (LIMA-LAD, SVG-OM1, SVG-OM2, SVG-dRCA   Hyperlipidemia due to dietary fat intake 08/26/2017    Past Medical History, Surgical history, Social history, and Family history were reviewed and updated as appropriate.   Please see review of systems for further details on the patient's review from today.   Objective:   Physical Exam:  There were no vitals taken for this visit.  Physical Exam Constitutional:      General: She is not in acute distress. Musculoskeletal:        General: No deformity.  Neurological:     Mental Status: She is alert and oriented to person, place, and time.     Coordination: Coordination normal.  Psychiatric:        Attention and Perception: Attention and perception normal. She does not perceive auditory or visual hallucinations.        Mood and Affect: Affect is not labile, blunt, angry or  inappropriate.        Speech: Speech normal.        Behavior: Behavior normal.        Thought Content: Thought content normal. Thought content is not paranoid or delusional. Thought content does not include homicidal or suicidal ideation. Thought content does not include homicidal or suicidal plan.        Cognition and Memory: Cognition and memory normal.        Judgment: Judgment normal.     Comments: Insight intact     Lab Review:     Component Value Date/Time   NA 142 01/06/2023 1029   K 5.0 01/06/2023 1029   CL 105 01/06/2023 1029   CO2 24 01/06/2023 1029   GLUCOSE 99 01/06/2023 1029   GLUCOSE 99 09/20/2022 1545   BUN 14 01/06/2023 1029   CREATININE 0.72 01/06/2023 1029   CALCIUM  9.6 01/06/2023 1029   PROT 7.7 09/20/2022 1545   PROT 6.0 03/03/2022 0833   ALBUMIN 4.6 09/20/2022 1545  ALBUMIN 4.1 03/03/2022 0833   AST 30 09/20/2022 1545   ALT 21 09/20/2022 1545   ALKPHOS 69 09/20/2022 1545   BILITOT 0.7 09/20/2022 1545   BILITOT 0.5 03/03/2022 0833   GFRNONAA >60 09/20/2022 1545   GFRAA 95 10/16/2017 1412       Component Value Date/Time   WBC 6.2 01/06/2023 1029   WBC 11.2 (H) 09/20/2022 1545   RBC 4.49 01/06/2023 1029   RBC 4.41 09/20/2022 1545   HGB 13.9 01/06/2023 1029   HCT 43.7 01/06/2023 1029   PLT 266 01/06/2023 1029   MCV 97 01/06/2023 1029   MCH 31.0 01/06/2023 1029   MCH 30.8 09/20/2022 1545   MCHC 31.8 01/06/2023 1029   MCHC 33.3 09/20/2022 1545   RDW 12.2 01/06/2023 1029   LYMPHSABS 0.9 09/18/2022 1902   MONOABS 0.7 09/18/2022 1902   EOSABS 0.0 09/18/2022 1902   BASOSABS 0.0 09/18/2022 1902    No results found for: POCLITH, LITHIUM   No results found for: PHENYTOIN, PHENOBARB, VALPROATE, CBMZ   .res Assessment: Plan:    Diagnoses and all orders for this visit:  Major depressive disorder, recurrent episode, moderate (HCC)  Generalized anxiety disorder  Insomnia, unspecified type  Tardive dyskinesia  Panic attacks      Please see After Visit Summary for patient specific instructions.  Future Appointments  Date Time Provider Department Center  03/04/2023  9:20 AM [facility] PET CT 1 [facility] [place]  03/09/2023  5:00 PM Bernardo Bruckner, [facility] None  03/12/2023  1:30 PM West, Katlyn D, NP [facility] None  04/03/2023 11:00 AM Alexsander Cavins Nattalie, NP CP-[facility] None    No orders of the defined types were placed in this encounter.   -------------------------------

## 2023-02-17 ENCOUNTER — Ambulatory Visit: Payer: Medicaid Other | Admitting: Cardiology

## 2023-02-20 NOTE — Telephone Encounter (Signed)
 Heather Lucas called and lvm today at 11:13 checking on the status of her appeal letter again. She is inquiring the timeline of completion concerning this matter. Pls advise.

## 2023-02-20 NOTE — Telephone Encounter (Signed)
 Patient's paperwork for an appeal including 4 letters has been faxed to [facility] LAD Claims Dept at [phone]

## 2023-02-20 NOTE — Telephone Encounter (Signed)
 Will fax all 3 letters she dropped off this week along with one from [place] today. No records will be sent.

## 2023-03-02 ENCOUNTER — Encounter (HOSPITAL_COMMUNITY): Payer: Self-pay

## 2023-03-03 ENCOUNTER — Telehealth: Payer: Self-pay | Admitting: Cardiology

## 2023-03-03 ENCOUNTER — Telehealth (HOSPITAL_COMMUNITY): Payer: Self-pay | Admitting: *Deleted

## 2023-03-03 DIAGNOSIS — R0609 Other forms of dyspnea: Secondary | ICD-10-CM

## 2023-03-03 NOTE — Telephone Encounter (Signed)
Patient is calling because she has a test scheduled for tomorrow morning. Patient stated her insurance called to tell her they will not cover the test and it will cost her almost $10,000 out of pocket. Patient is very concerned because this test was supposed to look at why she is SOB. Patient would like to know what options are available for her because she can not afford to pay that much out of pocket. Please advise.

## 2023-03-03 NOTE — Telephone Encounter (Signed)
 Reaching out to patient to offer assistance regarding upcoming cardiac imaging study; pt verbalizes understanding of appt date/time, parking situation and where to check in, pre-test NPO status and medications ordered, and verified current allergies; name and call back number provided for further questions should they arise Heather Frame RN Navigator Cardiac Imaging [facility] [phone] office [phone] cell  Patient aware to avoid caffeine for 12 hours prior to test.

## 2023-03-03 NOTE — Telephone Encounter (Signed)
Spoke with pt, according to pre-cert her insurance will not cover PET scan but will cover nuclear testing. Aware will forward to dr harding to see if nuclear testing is okay instead.

## 2023-03-04 ENCOUNTER — Encounter (HOSPITAL_COMMUNITY): Admission: RE | Admit: 2023-03-04 | Payer: Self-pay | Source: Ambulatory Visit

## 2023-03-04 NOTE — Telephone Encounter (Signed)
That's fine to change  [facility]

## 2023-03-06 ENCOUNTER — Telehealth: Payer: Self-pay | Admitting: Cardiology

## 2023-03-06 ENCOUNTER — Telehealth: Payer: Self-pay | Admitting: Neurology

## 2023-03-06 ENCOUNTER — Telehealth (HOSPITAL_COMMUNITY): Payer: Self-pay | Admitting: *Deleted

## 2023-03-06 NOTE — Telephone Encounter (Signed)
Patient called to report her Long Term Disability Insurer- Fanny Dance will be contacting Dr. Herbie Baltimore to do a peer-to-peer review in appeal to her canceled long term disability.

## 2023-03-06 NOTE — Telephone Encounter (Signed)
Per dr Herbie Baltimore, lexiscan myoview order placed.

## 2023-03-06 NOTE — Telephone Encounter (Signed)
Patient given detailed instructions per Myocardial Perfusion Study Information Sheet for the test on 03/09/2023 at 10:45. Patient notified to arrive 15 minutes early and that it is imperative to arrive on time for appointment to keep from having the test rescheduled.  If you need to cancel or reschedule your appointment, please call the office within 24 hours of your appointment. . Patient verbalized understanding.Heather Lucas

## 2023-03-06 NOTE — Telephone Encounter (Signed)
That is fine.  I have not seen her in 6 months so will be difficult to me and answer questions about a peer to peer.  She has an upcoming stress test ordered.  And it may be better to have it done after the stress test  Bryan Lemma, MD

## 2023-03-06 NOTE — Telephone Encounter (Signed)
FYI- Pt States that disability will be contacting Dr. Terrace Arabia in regards to her long term disability appeal. States they will be doing a peer review and will be requesting medical records. States it will be time sensitive

## 2023-03-09 ENCOUNTER — Ambulatory Visit (HOSPITAL_COMMUNITY): Payer: Medicaid Other | Attending: Cardiology

## 2023-03-09 ENCOUNTER — Encounter: Payer: Self-pay | Admitting: Cardiology

## 2023-03-09 ENCOUNTER — Ambulatory Visit (INDEPENDENT_AMBULATORY_CARE_PROVIDER_SITE_OTHER): Payer: Medicaid Other | Admitting: Mental Health

## 2023-03-09 ENCOUNTER — Telehealth: Payer: Self-pay | Admitting: Adult Health

## 2023-03-09 DIAGNOSIS — R0609 Other forms of dyspnea: Secondary | ICD-10-CM

## 2023-03-09 DIAGNOSIS — F411 Generalized anxiety disorder: Secondary | ICD-10-CM

## 2023-03-09 LAB — MYOCARDIAL PERFUSION IMAGING
LV dias vol: 51 mL (ref 46–106)
LV sys vol: 16 mL
Nuc Stress EF: 68 %
Peak HR: 93 {beats}/min
Rest HR: 66 {beats}/min
Rest Nuclear Isotope Dose: 10.2 mCi
SDS: 1
SRS: 0
SSS: 1
ST Depression (mm): 0 mm
Stress Nuclear Isotope Dose: 32.9 mCi
TID: 1.21

## 2023-03-09 MED ORDER — REGADENOSON 0.4 MG/5ML IV SOLN
0.4000 mg | Freq: Once | INTRAVENOUS | Status: AC
  Administered 2023-03-09: 0.4 mg via INTRAVENOUS

## 2023-03-09 MED ORDER — TECHNETIUM TC 99M TETROFOSMIN IV KIT
32.9000 | PACK | Freq: Once | INTRAVENOUS | Status: AC | PRN
Start: 2023-03-09 — End: 2023-03-09
  Administered 2023-03-09: 32.9 via INTRAVENOUS

## 2023-03-09 MED ORDER — TECHNETIUM TC 99M TETROFOSMIN IV KIT
10.2000 | PACK | Freq: Once | INTRAVENOUS | Status: AC | PRN
  Administered 2023-03-09: 10.2 via INTRAVENOUS

## 2023-03-09 NOTE — Progress Notes (Signed)
[facility]   Name: Heather Lucas Date: 03/09/23 MRN: [id number] DOB: January 17, 1961 PCP: Cleatis Polka., MD   Time spent:  50 minutes   Treatment:  Ind. therapy   Mental Status Exam:         Appearance:    Casual      Behavior:   Appropriate   Motor:   WNL   Speech/Language:    Clear and Coherent   Affect:   Full range    Mood:   anxious   Thought process:   Logical, linear, goal directed   Thought content:     WNL   Sensory/Perceptual disturbances:     none   Orientation:   x4   Attention:   Good   Concentration:   Good   Memory:   Intact   Fund of knowledge:    Consistent with age and development   Insight:     Good   Judgment:    Good   Impulse Control:   Good       Reported Symptoms:  anxiety daily (10/10), depression (3/10)   Risk Assessment: Danger to Self:  No Self-injurious Behavior: No Danger to Others: No Duty to Warn:no Physical Aggression / Violence:No  Access to Firearms a concern: No  Gang Involvement:No  Patient / guardian was educated about steps to take if suicide or homicide risk level increases between visits: yes While future psychiatric events cannot be accurately predicted, the patient does not currently require acute inpatient psychiatric care and does not currently meet [facility] involuntary commitment criteria.   Medical History/Surgical History:     Past Medical History:  Diagnosis Date   [place] coronary artery calcium score between 100 and 199 08/26/2017   Borderline hyperlipidemia      as of 10/2016: TC 134, TG 90, HDL 51, LDL 95 (PCP recently increase rosuvastatin dose from 10 to 20 mg)   Borderline hypertension     Coronary artery disease involving native coronary artery of native heart without angina pectoris 02/18/2020   Family history of premature coronary artery disease 08/26/2017   Hx of CABG x 4 06/17/2021   Hyperlipidemia due to dietary fat intake 08/26/2017   Ischemic cardiomyopathy 06/17/2021            Past Surgical History:  Procedure Laterality Date   CORONARY ARTERY BYPASS GRAFT   03/2020    [facility], [place], [place] -> reportedly CABG x4   LAPAROSCOPIC CHOLECYSTECTOMY   1995   LEFT HEART CATH AND CORONARY ANGIOGRAPHY   02/2020    [facility] [place], [place]) -> multivessel disease, referred for CABG   NM MYOVIEW LTD   02/2018    Clearly appears abnormal, referred for [facility], [place]   TRANSTHORACIC ECHOCARDIOGRAM   02/2020    Several echoes in January and February 2022-initially noted to be abnormal along with Myoview stress test, immediately post CABG recheck      Medications:       Current Outpatient Medications  Medication Sig Dispense Refill   aspirin 81 MG EC tablet Take 81 mg by mouth daily.       buPROPion (WELLBUTRIN XL) 300 MG 24 hr tablet Take 1 tablet (300 mg total) by mouth daily. 30 tablet 2   clonazePAM (KLONOPIN) 1 MG tablet TAKE ONE TABLET BY MOUTH TWICE DAILY 60 tablet 2   clopidogrel (PLAVIX) 75 MG tablet Take 75 mg by mouth daily.       ergocalciferol (VITAMIN D2) 1.25  MG (50000 UT) capsule Take 50,000 Units by mouth daily.       ezetimibe (ZETIA) 10 MG tablet Take 1 tablet (10 mg total) by mouth daily. 90 tablet 3   midodrine (PROAMATINE) 5 MG tablet Take 5 mg  in the morning  and take 2.5 mg ( 1/2 tablet ) in the evening.       mirtazapine (REMERON) 15 MG tablet Take 1 tablet (15 mg total) by mouth at bedtime. 30 tablet 2   rosuvastatin (CRESTOR) 20 MG tablet Take 20 mg by mouth daily.   5   Sennosides 15 MG TABS Take 15 mg by mouth daily. Takes 8 tablets a night       valbenazine (INGREZZA) 40 MG capsule Take 1 capsule (40 mg total) by mouth daily. 30 capsule 5   valbenazine (INGREZZA) 40 MG capsule Take 1 capsule (40 mg total) by mouth daily. 30 capsule 0    No current facility-administered medications for this visit.        Subjective:  Patient presents on time for today's session.  Assessed for progress, events  since last visit which was about 8 months ago.  She shared many changes, having some medication changes which has been helpful in reducing some symptoms such as shaking and unsteady gait.  She stated that she continues to take her psychiatric medications and feels they are currently helpful.  She went on to share how she has had shortness of breath and had a recent cardiology appointment, a stress test where she plans to get results in the next couple of days.  She shared her history of having a quadruple bypass in 2020, noting a significant family history of heart disease. Explored ways she tried to manage her anxiety which she stated persists most days.  She denied having panic attack symptoms other than shortness of breath.  Explored coping, reviewed diaphragmatic breathing exercises.  She plans to continue to exercise as she is been going to the gym about 2 times per week.  She shared other challenges over the past several months and how she is thankful for her support system that was helpful in getting her to her appointments, namely her engaging in intensive outpatient mental health services last fall.     Interventions: CBT, supportive therapy, motivational interviewing   Diagnoses:      ICD-10-CM    1. Major depressive disorder, recurrent episode, moderate (HCC)  F33.1                 Plan: Patient is to use CBT, mindfulness and coping skills to help manage decrease symptoms associated with their diagnosis.     Long-term goals:   Maintain symptom reduction: The patient will report sustained reduction in symptoms of anxiety using both CBT and mindfulness interventions for 3 consecutive months progressively.  Improve emotional regulation: The patient will learn and apply CBT and mindfulness-based strategies to regulate emotions, such as mindfulness-based stress reduction and cognitive restructuring, and report an improvement in emotional regulation for at least 3 consecutive months  progressively.   Short-term goal:  The patient will learn and apply CBT and mindfulness-based coping skills for managing anxiety and practice using it between sessions.       2.   Increase awareness of automatic thoughts: The patient will learn to identify automatic thoughts and increase awareness of their impact on emotions and behaviors through both CBT and mindfulness-based interventions.       3.    Identify, challenge, and  replace fearful/anxious/depressive self talk with positive, realistic, and empowering self talk that does not feed anxiety nor depression.         Waldron Session, [facility]

## 2023-03-09 NOTE — Telephone Encounter (Signed)
Dr. Dewaine Conger a consulting pyschiatric called and LM at 3:55 wanting to do a peer-to-peer review of the disability claim.  He requested a call back at [phone]

## 2023-03-09 NOTE — Telephone Encounter (Signed)
noted

## 2023-03-09 NOTE — Telephone Encounter (Signed)
See message regarding disability claim.

## 2023-03-11 ENCOUNTER — Telehealth: Payer: Self-pay | Admitting: Cardiology

## 2023-03-11 NOTE — Telephone Encounter (Signed)
Received a phone call from a cardiologist from [facility], Dr. Wilson Singer and he would like to talk with Dr. Herbie Baltimore in regards to the patient. Please call Dr. Wilson Singer back at [phone]

## 2023-03-11 NOTE — Telephone Encounter (Signed)
Pulled voicemail from a Dr. Marella Chimes left at 4:12PM on 03/11/23. "This is Dr. Marella Chimes consulting psychiatrist on behalf of [employer] insurance regarding peer to peer on a  disability claim for Heather Lucas 04/18/2060 phone number to call back is [phone]"

## 2023-03-11 NOTE — Telephone Encounter (Signed)
LM at 10:21 for Dr. Dewaine Conger to return call to schedule appt for peer-to-peer review.  If he calls back, just schedule an appt as "provider requested"  in an open slot on Gina's schedule. Indicate as peer review for Carreen K.  And let Almira Coaster know

## 2023-03-11 NOTE — Telephone Encounter (Signed)
Jola Babinski will call Dr to schedule.

## 2023-03-12 ENCOUNTER — Ambulatory Visit: Payer: Medicaid Other | Admitting: Cardiology

## 2023-03-12 NOTE — Telephone Encounter (Signed)
I can give you her paperwork that has been submitted for reference during your call.

## 2023-03-13 NOTE — Telephone Encounter (Signed)
Dr. Wilson Singer is calling back to follow up. He said his deadline to submit the report is today at 5 PM. He mentioned that receiving information about the patient's stress test would be helpful and would likely increase the chances of the patient being approved for long-term disability. He provided his cell phone number [phone]

## 2023-03-17 NOTE — Telephone Encounter (Signed)
FMLA paperwork received [employer]. Paperwork given to Antionette Fairy. for Dr. Herbie Baltimore to complete. Will contact pt in regards to necessary paperwork (ROI & Billing).  JB, 03-17-23

## 2023-03-19 NOTE — Telephone Encounter (Signed)
 I received a letter from Dr. Mcarthur of [employer] insurance company reference disability insurance.  He contacted the office on January 29 and 30 3 both attempts I was not available to call.  This questions were provided and a letter that was sent on February 3.  Questions involving the patient's care from November 20, 2022 until present.  Indication was no restrictions or limitations specified for the following conditions Atherosclerotic cardiovascular disease: Hypotension Hyperlipidemia  Mrs. Seifer is a 63 year old female with a past medical history notable for multivessel CAD diagnosed following an abnormal Nuclear Stress Test on February 23, 2020 (performed at [facility] [place]  [facility]: [facility]) -> she subsequently underwent cardiac catheterization on January 20 revealing multivessel disease with 50% proximal LAD, 6% mid LAD, 70% proximal and 90% mid circumflex, centers in mid RCA. => She underwent CABG x 4 on March 19, 2020: LIMA-LAD, SVG-OM1, SVG-OM 2 and SVG-distal RCA. => For post Hospital course was complicated by hypotension precluding the initiation of GDMT.  She was hypotensive and placed on midodrine .  She returned to [place]  the next year and I saw her for the first time to reestablish care on Jun 17, 2021 at which time she was stable with the exception of some mild exertional dyspnea, fatigue and positional dizziness.  Dr. Thornell letter indicates that there are questions of that he would have asked me: (Interestingly, these questions would have been answered simply by reading my note) What symptoms and the claimant report in the clinic visit on Jul 02, 2022? From HPI: She was dealing with strep throat but no cardiac symptoms.  No chest pain or shortness of breath at rest or exertion.  No PND, orthopnea edema.  No palpitations.  Mild bruising.  Occasional dizziness well-controlled with midodrine . => Plan was to wean off of midodrine  What  abnormal exam findings were documented during the visit?. From Physical Exam: Blood pressure 112/64, normal cardiac, pulmonary and general exam-no abnormal findings What do I think is the etiology of her coronary artery disease and hyperlipidemia? The etiology of coronary artery disease and hyperlipidemia is most likely genetic predisposition as she does not necessarily have hypertension, diabetes or smoking history. What do I think is etiology of her hypotension? Very unclear why her she is hypotensive.  Potentially component of orthostatic hypotension that may potentially be in some part neurocardiogenic. Plan was to wean her off of midodrine , ensure adequate hydration. What was her functional status at the time of the visit? When I saw her she was doing well.  Was getting over strep throat but but otherwise feeling fine-active with trying exercise and doing chores in the house. When do I plan to reassess her functional status? I have plan to see her back in 9 months, but she was seen by Katlyn West, NP on November 26 with complaints of increased dizziness and shortness of breath.  She noted worsening positional dizziness resolving after several seconds.  Mostly consistent with orthostasis.  She also noted increased shortness of breath with activity but denied any chest pain, PND or orthopnea. Stress test was ordered-originally scheduled as a cardiac stress PET converted to Myoview  because of insurance issues =>  (Myoview  Stress Test January 2025 was read as LOW RISK.  Normal EF of 68%.  Hyperdynamic.  No ischemia or infarction noted.) When do I plan to see her again in clinic? She is due to be seen in follow-up by Katlyn West, NP on February 20  to follow-up with the results of her stress test. Did I placed her on any activity restrictions or provide activity limitations? I have not placed her on any activity restrictions. What is her prognosis from a cardiovascular perspective? Based on her low  risk/normal stress test from March 10, 2023, I would imagine that her prognosis is quite good from a cardiovascular perspective.  Her lipids have been well-controlled, her blood pressure is borderline and she is taking low-dose midodrine .  Her only notable cardiovascular symptom is the orthostatic hypotension which for the most part has been controlled with midodrine .     Signed, Alm MICAEL Clay, MD, MS  Alm Clay, M.D., M.S. Interventional Cardiologist  [facility]  Pager # [phone] Phone # [phone] [address] [place], [place] [address]

## 2023-03-20 DIAGNOSIS — Z0279 Encounter for issue of other medical certificate: Secondary | ICD-10-CM

## 2023-03-20 NOTE — Telephone Encounter (Signed)
 Spoke w/pt in regards to LT-Dis paperwork completed by Dr. Addie Holstein. Pt will come into office today, 03-20-23, to complete 2 forms (ROI & Billing) + $29 processing fee.  JB, 03-20-23

## 2023-03-23 NOTE — Telephone Encounter (Signed)
 I failed to reach Dr. Verdell Given by calling [phone], left message.

## 2023-03-25 ENCOUNTER — Other Ambulatory Visit: Payer: Self-pay

## 2023-03-25 ENCOUNTER — Telehealth: Payer: Self-pay | Admitting: Adult Health

## 2023-03-25 MED ORDER — BUSPIRONE HCL 5 MG PO TABS: 5.0000 mg | ORAL_TABLET | Freq: Two times a day (BID) | ORAL | 0 refills | Status: DC

## 2023-03-25 NOTE — Telephone Encounter (Signed)
Buspar 5mg  bid sent to [facility]. Per Gina's rqst.

## 2023-03-25 NOTE — Telephone Encounter (Signed)
Heather Lucas called at 10:29 to report that her anxiety has worsened.  She would like something called in so help her with this.  Next apt 2/21.  Send to [employer].

## 2023-03-25 NOTE — Telephone Encounter (Signed)
Kaitlynne reports she is unemployed, fighting to get LTD, is running low on money and the stress is really kicking in.  She reports feeling anxious most of the day. She rates her stress a 9.  Lv 1/3 nv 2/21 She would like something for anxiety. She has tried Zoloft, Prozac, , Lexapro, and Hydroxyzine.   Please advise.   [employer].

## 2023-03-27 ENCOUNTER — Telehealth: Payer: Self-pay | Admitting: Adult Health

## 2023-03-27 ENCOUNTER — Ambulatory Visit: Payer: Medicaid Other | Admitting: Mental Health

## 2023-03-27 DIAGNOSIS — F411 Generalized anxiety disorder: Secondary | ICD-10-CM | POA: Diagnosis not present

## 2023-03-27 NOTE — Progress Notes (Signed)
[facility] Note   Name: Heather Lucas Date: 03/27/23 MRN: [id number] DOB: 1960/10/02 PCP: Cleatis Polka., MD   Time spent:  49 minutes   Treatment:  Ind. therapy   Mental Status Exam:         Appearance:    Casual      Behavior:   Appropriate   Motor:   WNL   Speech/Language:    Clear and Coherent   Affect:   Full range    Mood:   anxious   Thought process:   Logical, linear, goal directed   Thought content:     WNL   Sensory/Perceptual disturbances:     none   Orientation:   x4   Attention:   Good   Concentration:   Good   Memory:   Intact   Fund of knowledge:    Consistent with age and development   Insight:     Good   Judgment:    Good   Impulse Control:   Good       Reported Symptoms:  anxiety daily (10/10), depression (3/10)   Risk Assessment: Danger to Self:  No Self-injurious Behavior: No Danger to Others: No Duty to Warn:no Physical Aggression / Violence:No  Access to Firearms a concern: No  Gang Involvement:No  Patient / guardian was educated about steps to take if suicide or homicide risk level increases between visits: yes While future psychiatric events cannot be accurately predicted, the patient does not currently require acute inpatient psychiatric care and does not currently meet [facility] involuntary commitment criteria.   Medical History/Surgical History:     Past Medical History:  Diagnosis Date   Agatston coronary artery calcium score between 100 and 199 08/26/2017   Borderline hyperlipidemia      as of 10/2016: TC 134, TG 90, HDL 51, LDL 95 (PCP recently increase rosuvastatin dose from 10 to 20 mg)   Borderline hypertension     Coronary artery disease involving native coronary artery of native heart without angina pectoris 02/18/2020   Family history of premature coronary artery disease 08/26/2017   Hx of CABG x 4 06/17/2021   Hyperlipidemia due to dietary fat intake 08/26/2017   Ischemic cardiomyopathy 06/17/2021            Past Surgical History:  Procedure Laterality Date   CORONARY ARTERY BYPASS GRAFT   03/2020    [facility] [place], [place] -> reportedly CABG x4   LAPAROSCOPIC CHOLECYSTECTOMY   1995   LEFT HEART CATH AND CORONARY ANGIOGRAPHY   02/2020    [facility], [place]) -> multivessel disease, referred for CABG   NM MYOVIEW LTD   02/2018    Clearly appears abnormal, referred for [facility], [place]   TRANSTHORACIC ECHOCARDIOGRAM   02/2020    Several echoes in January and February 2022-initially noted to be abnormal along with Myoview stress test, immediately post CABG recheck      Medications:       Current Outpatient Medications  Medication Sig Dispense Refill   aspirin 81 MG EC tablet Take 81 mg by mouth daily.       buPROPion (WELLBUTRIN XL) 300 MG 24 hr tablet Take 1 tablet (300 mg total) by mouth daily. 30 tablet 2   clonazePAM (KLONOPIN) 1 MG tablet TAKE ONE TABLET BY MOUTH TWICE DAILY 60 tablet 2   clopidogrel (PLAVIX) 75 MG tablet Take 75 mg by mouth daily.       ergocalciferol (VITAMIN D2) 1.25  MG (50000 UT) capsule Take 50,000 Units by mouth daily.       ezetimibe (ZETIA) 10 MG tablet Take 1 tablet (10 mg total) by mouth daily. 90 tablet 3   midodrine (PROAMATINE) 5 MG tablet Take 5 mg  in the morning  and take 2.5 mg ( 1/2 tablet ) in the evening.       mirtazapine (REMERON) 15 MG tablet Take 1 tablet (15 mg total) by mouth at bedtime. 30 tablet 2   rosuvastatin (CRESTOR) 20 MG tablet Take 20 mg by mouth daily.   5   Sennosides 15 MG TABS Take 15 mg by mouth daily. Takes 8 tablets a night       valbenazine (INGREZZA) 40 MG capsule Take 1 capsule (40 mg total) by mouth daily. 30 capsule 5   valbenazine (INGREZZA) 40 MG capsule Take 1 capsule (40 mg total) by mouth daily. 30 capsule 0    No current facility-administered medications for this visit.        Subjective:  Patient presents on time for today's session.  Assessed progress per patient  shared she has been dealing with ongoing anxiety, some recent panic attacks.  Reports having a panic attack at one of her doctor appointments unexpectedly.  She stated that it was easier when the doctor was present as she was able to distract herself.  She went on to identify stressors, financial concerns as she continues to be unemployed and her disability benefits discontinued.  She stated she has applied for long-term disability but has been denied.  She has concerns about her ability to work, citing her short-term memory deficits.  Explored ways to cope and care for herself, reviewed diaphragmatic breathing exercises in detail, which she appeared to not recall from last session.  Led her through some exercises in session and encouraged daily utilization.  Also, discussed mindfulness strategies to utilize as well and potential benefits.     Interventions: CBT, supportive therapy, motivational interviewing   Diagnoses:      ICD-10-CM    1. Major depressive disorder, recurrent episode, moderate (HCC)  F33.1                 Plan: Patient is to use CBT, mindfulness and coping skills to help manage decrease symptoms associated with their diagnosis.     Long-term goals:   Maintain symptom reduction: The patient will report sustained reduction in symptoms of anxiety using both CBT and mindfulness interventions for 3 consecutive months progressively.  Improve emotional regulation: The patient will learn and apply CBT and mindfulness-based strategies to regulate emotions, such as mindfulness-based stress reduction and cognitive restructuring, and report an improvement in emotional regulation for at least 3 consecutive months progressively.   Short-term goal:  The patient will learn and apply CBT and mindfulness-based coping skills for managing anxiety and practice using it between sessions.       2.   Increase awareness of automatic thoughts: The patient will learn to identify automatic thoughts  and increase awareness of their impact on emotions and behaviors through both CBT and mindfulness-based interventions.       3.    Identify, challenge, and replace fearful/anxious/depressive self talk with positive, realistic, and empowering self talk that does not feed anxiety nor depression.         Waldron Session, [facility]

## 2023-03-27 NOTE — Telephone Encounter (Signed)
Heather Lucas was in the office today and inquired about follow up questions that were supposedly faxed to [place] from disability as further info needed for the appeal to her disability.  Did we get any other questions?  If so what is the status?  Please let her know.

## 2023-04-01 NOTE — Progress Notes (Deleted)
   Cardiology Office Note    Date:  04/01/2023  ID:  Emberlee Sortino, DOB 04-13-1960, MRN [id number] PCP:  Cleatis Polka., MD  Cardiologist:  Bryan Lemma, MD  Electrophysiologist:  None   Chief Complaint: ***  History of Present Illness: .    Avionna Bower is a 63 y.o. female with visit-pertinent history of CAD s/p CABG x4, ICM, hyperlipidemia, dyspnea on exertion, depression and tardive dyskinesia who presents regarding shortness of breath and dizziness.   Ms. Feldkamp was evaluated in July 2019 in the setting of abnormal coronary calcium score and fatigue.  She then moved to [place] to live near her family and while living in [place] she underwent CABG x 4.  It was noted she had cardiomyopathy with reduced EF.  Her last echocardiogram on 07/19/2021 indicated LVEF of 65 to 70%, LV with normal function, no RWMA, LV diastolic parameters were grossly normal despite abnormal septal and cardiac motion.  There was aortic valve sclerosis present without evidence of aortic valve stenosis.  She was seen by Dr. Herbie Baltimore on 07/02/2022.  She noted increased bruising her aspirin was stopped and she was continued on Plavix.  Patient was seen on 01/06/2023 for acute visit regarding increased dizziness and shortness of breath.  She noted that the prior days she start experiencing some dizziness and lightheadedness position changes that resolved after a few seconds.  She also reported increased shortness of breath that started a few weeks prior.  She denies chest pain, palpitations, weight change.  It was recommended that she undergo PET stress however insurance denied this.  She underwent MPI on 03/09/2023 that was normal and low risk, no ST deviation was noted.  LV function was normal nuclear stress EF 68%.  Today she presents for follow-up.  She reports that she   Labwork independently reviewed:   ROS: .    Please see the history of present illness. Otherwise, review of systems is positive for ***.  All  other systems are reviewed and otherwise negative.  Studies Reviewed: Marland Kitchen    EKG:  EKG is ordered today, personally reviewed, demonstrating ***  CV Studies: Cardiac studies reviewed are outlined and summarized above. Otherwise please see EMR for full report.   Current Reported Medications:.    No outpatient medications have been marked as taking for the 04/02/23 encounter (Appointment) with Rip Harbour, NP.    Physical Exam:    VS:  There were no vitals taken for this visit.   Wt Readings from Last 3 Encounters:  03/09/23 110 lb (49.9 kg)  01/19/23 110 lb (49.9 kg)  01/06/23 108 lb (49 kg)    GEN: Well nourished, well developed in no acute distress NECK: No JVD; No carotid bruits CARDIAC: ***RRR, no murmurs, rubs, gallops RESPIRATORY:  Clear to auscultation without rales, wheezing or rhonchi  ABDOMEN: Soft, non-tender, non-distended EXTREMITIES:  No edema; No acute deformity   Asessement and Plan:.     ***     Disposition: F/u with ***  Signed, Rip Harbour, NP

## 2023-04-02 ENCOUNTER — Ambulatory Visit: Payer: Medicaid Other | Attending: Cardiology | Admitting: Cardiology

## 2023-04-02 ENCOUNTER — Ambulatory Visit: Payer: Medicaid Other | Admitting: Cardiology

## 2023-04-02 ENCOUNTER — Encounter: Payer: Self-pay | Admitting: Cardiology

## 2023-04-02 VITALS — BP 122/74 | HR 71 | Ht 61.0 in | Wt 121.6 lb

## 2023-04-02 DIAGNOSIS — I255 Ischemic cardiomyopathy: Secondary | ICD-10-CM

## 2023-04-02 DIAGNOSIS — R011 Cardiac murmur, unspecified: Secondary | ICD-10-CM

## 2023-04-02 DIAGNOSIS — R0609 Other forms of dyspnea: Secondary | ICD-10-CM | POA: Diagnosis not present

## 2023-04-02 DIAGNOSIS — I959 Hypotension, unspecified: Secondary | ICD-10-CM

## 2023-04-02 DIAGNOSIS — I2510 Atherosclerotic heart disease of native coronary artery without angina pectoris: Secondary | ICD-10-CM

## 2023-04-02 DIAGNOSIS — E785 Hyperlipidemia, unspecified: Secondary | ICD-10-CM

## 2023-04-02 DIAGNOSIS — Z951 Presence of aortocoronary bypass graft: Secondary | ICD-10-CM

## 2023-04-02 NOTE — Progress Notes (Signed)
 Cardiology Office Note    Date:  04/02/2023  ID:  Heather Lucas, Heather Lucas August 03, 1960, MRN [id number] PCP:  Cleatis Polka., MD  Cardiologist:  Bryan Lemma, MD  Electrophysiologist:  None   Chief Complaint: Shortness of breath   History of Present Illness: .    Heather Lucas is a 63 y.o. female with visit-pertinent history of CAD s/p CABG x4, ICM, hyperlipidemia, dyspnea on exertion, depression and tardive dyskinesia who presents regarding shortness of breath and dizziness.   [name] was evaluated in July 2019 in the setting of abnormal coronary calcium score and fatigue.  She then moved to [place] to live near her family and while living in [place] she underwent CABG x 4.  It was noted she had cardiomyopathy with reduced EF.  Her last echocardiogram on 07/19/2021 indicated LVEF of 65 to 70%, LV with normal function, no RWMA, LV diastolic parameters were grossly normal despite abnormal septal and cardiac motion.  There was aortic valve sclerosis present without evidence of aortic valve stenosis.  She was seen by Dr. Herbie Baltimore on 07/02/2022.  She noted increased bruising her aspirin was stopped and she was continued on Plavix.  Patient was seen on 01/06/2023 for acute visit regarding increased dizziness and shortness of breath.  She noted that the prior days she start experiencing some dizziness and lightheadedness position changes that resolved after a few seconds.  She also reported increased shortness of breath that started a few weeks prior.  She denies chest pain, palpitations, weight change.  It was recommended that she undergo PET stress however insurance denied this.  She underwent MPI on 03/09/2023 that was normal and low risk, no ST deviation was noted.  LV function was normal nuclear stress EF 68%.  Today she presents for follow-up.  She reports that she has been doing ok, she notes she has been having significant anxiety lately as she has been out of work for multiple months.  She  denies chest pain, palpitations, lower extremity edema, orthopnea or PND.  She denies any dizziness, lightheadedness presyncope or syncope.  She does note that her shortness of breath has been ongoing, she questions if this is still related to her anxiety as it does typically worsen when she becomes more anxious.  She does note that this does not feel similar to panic attacks.  Reviewed her recent stress test, all of her questions were answered.  She has started trying to exercise 1-2 times a week, going to low impact aerobic classes at recreation centers.  Labwork independently reviewed: 01/06/2023: Sodium 142, potassium 5.0, creatinine 0.72, hemoglobin 13.9, hematocrit 43.7 ROS: .   Today she denies chest pain, lower extremity edema, fatigue, palpitations, melena, hematuria, hemoptysis, diaphoresis, weakness, presyncope, syncope, orthopnea, and PND.  All other systems are reviewed and otherwise negative. Studies Reviewed: Marland Kitchen    EKG:  EKG is not ordered today.  CV Studies:  Cardiac Studies & Procedures   ______________________________________________________________________________________________   STRESS TESTS  MYOCARDIAL PERFUSION IMAGING 03/09/2023  Narrative   The study is normal. The study is low risk.   No ST deviation was noted.   Left ventricular function is normal. Nuclear stress EF: 68%. The left ventricular ejection fraction is hyperdynamic (>65%). End diastolic cavity size is normal.   ECHOCARDIOGRAM  ECHOCARDIOGRAM COMPLETE 07/19/2021  Narrative ECHOCARDIOGRAM REPORT    Patient Name:   Heather Lucas Date of Exam: 07/19/2021 Medical Rec #:  [id number]      Height:  62.0 in Accession #:    [id number]     Weight:       144.8 lb Date of Birth:  1960-03-20      BSA:          1.667 m Patient Age:    60 years       BP:           120/90 mmHg Patient Gender: F              HR:           69 bpm. Exam Location:  [address]  Procedure: 2D Echo, Cardiac Doppler, Color  Doppler and Intracardiac Opacification Agent  Indications:    I25.10 Coronary artery disease; Z95.1 S/P CABG  History:        Patient has no prior history of Echocardiogram examinations. Risk Factors:Dyslipidemia and Family History of Coronary Artery Disease. Dyspnea on exertion. Ischemic cardiomyopathy.  Sonographer:    Cathie Beams RCS Referring Phys: [phone] DAVID W HARDING  IMPRESSIONS   1. Left ventricular ejection fraction, by estimation, is 65 to 70%. The left ventricle has normal function. The left ventricle has no regional wall motion abnormalities. Left ventricular diastolic parameters were grossly normal despite abnormal septal and cardiac motion in post operative state. 2. Right ventricular systolic function is normal. The right ventricular size is normal. Tricuspid regurgitation signal is inadequate for assessing PA pressure. 3. The mitral valve is normal in structure. Trivial mitral valve regurgitation. No evidence of mitral stenosis. 4. The aortic valve is tricuspid. There is mild calcification of the aortic valve. Aortic valve regurgitation is not visualized. Aortic valve sclerosis is present, with no evidence of aortic valve stenosis. 5. The inferior vena cava is normal in size with greater than 50% respiratory variability, suggesting right atrial pressure of 3 mmHg.  FINDINGS Left Ventricle: Left ventricular ejection fraction, by estimation, is 65 to 70%. The left ventricle has normal function. The left ventricle has no regional wall motion abnormalities. The left ventricular internal cavity size was normal in size. There is no left ventricular hypertrophy. Abnormal (paradoxical) septal motion consistent with post-operative status. Left ventricular diastolic parameters were normal.  Right Ventricle: The right ventricular size is normal. No increase in right ventricular wall thickness. Right ventricular systolic function is normal. Tricuspid regurgitation signal is  inadequate for assessing PA pressure.  Left Atrium: Left atrial size was normal in size.  Right Atrium: Right atrial size was normal in size.  Pericardium: There is no evidence of pericardial effusion.  Mitral Valve: The mitral valve is normal in structure. Trivial mitral valve regurgitation. No evidence of mitral valve stenosis.  Tricuspid Valve: The tricuspid valve is normal in structure. Tricuspid valve regurgitation is trivial. No evidence of tricuspid stenosis.  Aortic Valve: The aortic valve is tricuspid. There is mild calcification of the aortic valve. Aortic valve regurgitation is not visualized. Aortic valve sclerosis is present, with no evidence of aortic valve stenosis.  Pulmonic Valve: The pulmonic valve was normal in structure. Pulmonic valve regurgitation is mild. No evidence of pulmonic stenosis.  Aorta: The aortic root is normal in size and structure.  Venous: The inferior vena cava is normal in size with greater than 50% respiratory variability, suggesting right atrial pressure of 3 mmHg.  IAS/Shunts: No atrial level shunt detected by color flow Doppler.   LEFT VENTRICLE PLAX 2D LVIDd:         3.90 cm   Diastology LVIDs:  2.10 cm   LV e' medial:    8.05 cm/s LV PW:         0.80 cm   LV E/e' medial:  8.3 LV IVS:        1.00 cm   LV e' lateral:   15.40 cm/s LVOT diam:     2.00 cm   LV E/e' lateral: 4.3 LV SV:         63 LV SV Index:   38 LVOT Area:     3.14 cm   RIGHT VENTRICLE RV Basal diam:  2.10 cm RV S prime:     4.78 cm/s  LEFT ATRIUM             Index        RIGHT ATRIUM          Index LA diam:        3.20 cm 1.92 cm/m   RA Area:     9.96 cm LA Vol (A2C):   25.5 ml 15.30 ml/m  RA Volume:   18.80 ml 11.28 ml/m LA Vol (A4C):   18.1 ml 10.86 ml/m LA Biplane Vol: 21.4 ml 12.84 ml/m AORTIC VALVE LVOT Vmax:   112.00 cm/s LVOT Vmean:  61.700 cm/s LVOT VTI:    0.199 m  AORTA Ao Root diam: 3.00 cm Ao Asc diam:  2.90 cm  MITRAL VALVE MV  Area (PHT): 5.66 cm    SHUNTS MV Decel Time: 134 msec    Systemic VTI:  0.20 m MV E velocity: 66.90 cm/s  Systemic Diam: 2.00 cm MV A velocity: 67.40 cm/s MV E/A ratio:  0.99  Weston Brass MD Electronically signed by Weston Brass MD Signature Date/Time: 07/19/2021/10:01:01 PM    Final      CT SCANS  CT CORONARY MORPH W/CTA COR W/SCORE 10/22/2017  Addendum 10/26/2017  9:07 AM ADDENDUM REPORT: 10/23/2017 12:39  EXAM: Cardiac/Coronary  CT  TECHNIQUE: The patient was scanned on a [employer].  FINDINGS: A 120 kV prospective scan was triggered in the descending thoracic aorta at 111 HU's. Axial non-contrast 3 mm slices were carried out through the heart. The data set was analyzed on a dedicated work station and scored using the Agatson method. Gantry rotation speed was 250 msecs and collimation was .6 mm. No beta blockade and 0.8 mg of sl NTG was given. The 3D data set was reconstructed in 5% intervals of the 67-82 % of the R-R cycle. Diastolic phases were analyzed on a dedicated work station using MPR, MIP and VRT modes. The patient received 80 cc of contrast.  Aorta:  Normal size.  No calcifications.  No dissection.  Aortic Valve:  Trileaflet.  No calcifications.  Coronary Arteries:  Normal coronary origin.  Right dominance.  RCA is a large dominant artery that gives rise to PDA and PLVB. There is mild (0-25%), non-obstructing soft plaque in the mid RCA.  Left main is a large artery that gives rise to LAD and LCX arteries There is mild (0-25%), non-obstructive calcified plaque.  LAD is a large vessel that has no plaque. There is a normal size D1 and small D2.  LCX is a non-dominant artery that gives rise to OM1 and OM2 branches. There is mild (0-25%), non-obstructive calcified plaque in the proximal LCX.  Other findings:  Normal pulmonary vein drainage into the left atrium.  Normal let atrial appendage without a thrombus.  Normal size of the  pulmonary artery.  IMPRESSION: 1. Coronary calcium score of 148.  This was 94th percentile for age and sex matched control.  2. Normal coronary origin with right dominance.  3. Mild, non-obstructive CAD.  4. Recommend aggressive medical management of coronary artery disease.  Chilton Si, MD   Electronically Signed By: Chilton Si On: 10/23/2017 12:39  Narrative EXAM: OVER-READ INTERPRETATION  CT CHEST  The following report is an over-read performed by radiologist Dr. Charlett Nose of [facility] on 10/22/2017. This over-read does not include interpretation of cardiac or coronary anatomy or pathology. The coronary CTA interpretation by the cardiologist is attached.  COMPARISON:  08/12/2017  FINDINGS: Vascular: Heart is normal size.  Visualized aorta is normal caliber.  Mediastinum/Nodes: No adenopathy in the lower mediastinum or hila.  Lungs/Pleura: Visualized lungs clear.  No effusions.  Upper Abdomen: Imaging into the upper abdomen shows no acute findings.  Musculoskeletal: Chest wall soft tissues are unremarkable. No acute bony abnormality.  IMPRESSION: No acute or significant extracardiac abnormality.  Electronically Signed: By: Charlett Nose M.D. On: 10/22/2017 11:05   CT SCANS  CT CARDIAC SCORING (SELF PAY ONLY) 08/12/2017  Narrative CLINICAL DATA:  Hyperlipidemia  EXAM: CT HEART FOR CALCIUM SCORING  TECHNIQUE: CT heart was performed on a 64 channel system using prospective ECG gating.  A non-contrast exam for calcium scoring was performed.  Note that this exam targets the heart and the chest was not imaged in its entirety.  COMPARISON:  None.  FINDINGS: Technical quality: Good.  CORONARY CALCIUM  Total Agatston Score: 125 with calcifications noted in the left main, left anterior descending, and right coronary arteries.  MESA database percentile:  40  OTHER FINDINGS:  Cardiovascular: Heart is normal size.  Visualized aorta is normal caliber.  Mediastinum/Nodes: No adenopathy in the lower mediastinum or hila.  Lungs/Pleura: Visualized lungs clear.  No effusions.  Upper Abdomen: Imaging into the upper abdomen shows no acute findings.  Musculoskeletal: Chest wall soft tissues are unremarkable. No acute bony abnormality.  IMPRESSION: The observed calcium score of 125 is at the percentile 93 for subjects of the same age, gender and race/ethnicity who are free of clinical cardiovascular disease and treated diabetes.  No acute or significant extracardiac abnormality.   Electronically Signed By: Charlett Nose M.D. On: 08/12/2017 14:36     ______________________________________________________________________________________________        Current Reported Medications:.    Current Meds  Medication Sig   benztropine (COGENTIN) 0.5 MG tablet Take 1 tablet (0.5 mg total) by mouth 2 (two) times daily.   busPIRone (BUSPAR) 5 MG tablet Take 1 tablet (5 mg total) by mouth 2 (two) times daily. (Patient taking differently: Take 5 mg by mouth as needed.)   clopidogrel (PLAVIX) 75 MG tablet Take 1 tablet (75 mg total) by mouth daily.   ezetimibe (ZETIA) 10 MG tablet Take 1 tablet (10 mg total) by mouth daily.   midodrine (PROAMATINE) 2.5 MG tablet Take 2.5 mg by mouth 2 (two) times daily with a meal.   mirtazapine (REMERON) 7.5 MG tablet Take 1 tablet (7.5 mg total) by mouth at bedtime.   mupirocin cream (BACTROBAN) 2 % Apply topically 2 (two) times daily. (Patient taking differently: Apply topically as needed.)   pantoprazole (PROTONIX) 40 MG tablet Take 40 mg by mouth every morning.   rosuvastatin (CRESTOR) 20 MG tablet Take 1 tablet (20 mg total) by mouth daily.   Physical Exam:    VS:  BP 122/74 (BP Location: Left Arm, Patient Position: Sitting, Cuff Size: Normal)   Pulse 71  Ht 5\' 1"  (1.549 m)   Wt 121 lb 9.6 oz (55.2 kg)   SpO2 98%   BMI 22.98 kg/m    Wt Readings from Last 3  Encounters:  04/02/23 121 lb 9.6 oz (55.2 kg)  03/09/23 110 lb (49.9 kg)  01/19/23 110 lb (49.9 kg)    GEN: Well nourished, well developed in no acute distress NECK: No JVD; No carotid bruits CARDIAC: RRR, 1/6 systolic murmur, no rubs or gallops RESPIRATORY:  Clear to auscultation without rales, wheezing or rhonchi  ABDOMEN: Soft, non-tender, non-distended EXTREMITIES:  No edema; No acute deformity  Asessement and Plan:.     Shortness of breath/Cardiac murmur: Patient reports ongoing shortness of breath, she feels that this may be related to anxiety, notes that this does worsen when she becomes more anxious.  However she does note that this is not related to anxiety attacks.  On auscultation today she has a very faint systolic murmur.  Patient denies any lower extremity edema, orthopnea or PND.  She has had weight gain of 11 pounds since last office visit, notes that she has been increasing her intake of ice cream and pudding.  Will check echocardiogram given ongoing shortness of breath and cardiac murmur.  Reviewed ED precautions.  CAD: S/p CABG x 4.  Previous anginal equivalent noted be increased shortness of breath.  Patient underwent MPI 03/09/2023 that was normal and low risk. Today she reports that she has ongoing shortness of breath, denies any chest pain.  Will check echocardiogram as noted above. Reviewed ED precautions. Continue Plavix 75 mg daily, Zetia 10 mg daily, Crestor 20 mg daily.  Hypotension/dizziness: Blood pressure today 122/74. Continue midodrine 2.5 mg twice daily.  Patient denies any further dizziness or lightheadedness.  ICM: Post CABG was noted to have reduced EF, her last echocardiogram on 07/19/2021 indicated LVEF of 65 to 70%, LV with normal function, no RWMA, LV diastolic parameters were grossly normal despite abnormal septal and cardiac motion.  There was aortic valve sclerosis present without evidence of aortic valve stenosis.  Given ongoing shortness of breath and  cardiac murmur will check echocardiogram.  Hyperlipidemia: Last lipid profile in 07/16/2022 indicated total cholesterol 112, triglycerides 96, HDL 44 and LDL 49.  Continue Crestor 20 mg daily and ezetimibe 10 mg daily.   Disposition: F/u with Dr. Herbie Baltimore in three months or sooner if needed.   Signed, Rip Harbour, NP

## 2023-04-02 NOTE — Patient Instructions (Addendum)
 Medication Instructions:  No changes *If you need a refill on your cardiac medications before your next appointment, please call your pharmacy*  Lab Work: No labs  Testing/Procedures: Your physician has requested that you have an echocardiogram. Echocardiography is a painless test that uses sound waves to create images of your heart. It provides your doctor with information about the size and shape of your heart and how well your heart's chambers and valves are working. This procedure takes approximately one hour. There are no restrictions for this procedure. Please do NOT wear cologne, perfume, aftershave, or lotions (deodorant is allowed). Please arrive 15 minutes prior to your appointment time.  Please note: We ask at that you not bring children with you during ultrasound (echo/ vascular) testing. Due to room size and safety concerns, children are not allowed in the ultrasound rooms during exams. Our front office staff cannot provide observation of children in our lobby area while testing is being conducted. An adult accompanying a patient to their appointment will only be allowed in the ultrasound room at the discretion of the ultrasound technician under special circumstances. We apologize for any inconvenience.  Follow-Up: At [facility], you and your health needs are our priority.  As part of our continuing mission to provide you with exceptional heart care, we have created designated Provider Care Teams.  These Care Teams include your primary Cardiologist (physician) and Advanced Practice Providers (APPs -  Physician Assistants and Nurse Practitioners) who all work together to provide you with the care you need, when you need it.  We recommend signing up for the patient portal called "MyChart".  Sign up information is provided on this After Visit Summary.  MyChart is used to connect with patients for Virtual Visits (Telemedicine).  Patients are able to view lab/test results,  encounter notes, upcoming appointments, etc.  Non-urgent messages can be sent to your provider as well.   To learn more about what you can do with MyChart, go to [email or web address].    Your next appointment:   3 month(s)  Provider:   Bryan Lemma, MD

## 2023-04-03 ENCOUNTER — Ambulatory Visit: Payer: Medicaid Other | Admitting: Adult Health

## 2023-04-03 ENCOUNTER — Encounter: Payer: Self-pay | Admitting: Adult Health

## 2023-04-03 DIAGNOSIS — G4700 Insomnia, unspecified: Secondary | ICD-10-CM | POA: Diagnosis not present

## 2023-04-03 DIAGNOSIS — F411 Generalized anxiety disorder: Secondary | ICD-10-CM | POA: Diagnosis not present

## 2023-04-03 DIAGNOSIS — G2401 Drug induced subacute dyskinesia: Secondary | ICD-10-CM

## 2023-04-03 DIAGNOSIS — F331 Major depressive disorder, recurrent, moderate: Secondary | ICD-10-CM

## 2023-04-03 DIAGNOSIS — G2571 Drug induced akathisia: Secondary | ICD-10-CM

## 2023-04-03 NOTE — Progress Notes (Signed)
 Heather Lucas [id number] 03/25/1960 63 y.o.  Subjective:   Patient ID:  Heather Lucas is a 63 y.o. (DOB 1960-12-28) female.  Chief Complaint: No chief complaint on file.   HPI Heather Lucas presents to the office today for follow-up of MDD, GAD, panic attacks and TD.  Describes mood today as "anxious". Pleasant. Flat affect. Reports some tearfulness. Mood symptoms - reports decreased depression - "a little bit". Reports ongoing anxiety. Denies irritability. Reports panic attacks. Reports worry, rumination, and over thinking. Denies obsessive thoughts. Reports mood as stable. Stating "I feel like I'm doing fair". Denies recent falls.  Feels like current medications work well. Reports  taking Remeron 7.5mg  daily at bedtime and Cogentin 0.5mg  twice daily. Reports varying interest and motivation. Taking medications as prescribed.  Energy levels low. Active, does not have a regular exercise routine.  Enjoys some usual interests and activities. Single. Lives alone. Getting out occasionally. Brother in [place]. Friends local. Attends church. Appetite adequate. Weight gain - 107 to 118 pounds - 61".  Sleeps well at night. Averages 8 to 9 hours.   Reports ongoing difficulties with focus and concentration. Reports being forgetful, but doing better. Reports having to make notes to keep up with things. Does not feel like she is able to return to work at this time with her anxiety and memory issues. Completing minimal tasks at home - "I do some things". Managing some aspects of household - "I'm trying". 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 Has applied for LTD benefits. Patient totally disabled and unable to work at this time. Denies SI and HI. Denies AH or AH. Denies self harm. Denies substance use.  Previous medication trials:  Zoloft, Prozac, Wellbutrin, Lexapro, Trintellix, Hydroxyzine.    AIMS    Flowsheet Row  Admission (Discharged) from 09/21/2022 in [facility] BE[facility]AL MEDICINE ED from 09/20/2022 in [facility] Emergency Department at [facility] Office Visit from 12/13/2021 in [facility]  AIMS Total Score 14 14 28       AUDIT    Flowsheet Row Admission (Discharged) from 09/21/2022 in [facility] BE[facility]AVIORAL MEDICINE  Alcohol Use Disorder Identification Test Final Score (AUDIT) 0      Mini-Mental    Flowsheet Row Office Visit from 07/30/2022 in [facility]  Total Score (max 30 points ) 30      Flowsheet Row Admission (Discharged) from 09/21/2022 in [facility] BEHAVIORAL MEDICINE ED from 09/20/2022 in [facility] Emergency Department at [facility] ED from 09/18/2022 in [facility] Emergency Department at [facility]  C-SSRS RISK CATEGORY Moderate Risk Moderate Risk No Risk        Review of Systems:  Review of Systems  Musculoskeletal:  Negative for gait problem.  Neurological:  Negative for tremors.  Psychiatric/Behavioral:         Please refer to HPI    Medications: I have reviewed the patient's current medications.  Current Outpatient Medications  Medication Sig Dispense Refill   benztropine (COGENTIN) 0.5 MG tablet Take 1 tablet (0.5 mg total) by mouth 2 (two) times daily. 60 tablet 2   busPIRone (BUSPAR) 5 MG tablet Take 1 tablet (5 mg total) by mouth 2 (two) times daily. (Patient taking differently: Take 5 mg by mouth as needed.) 60 tablet 0   clopidogrel (PLAVIX) 75 MG tablet Take 1 tablet (75 mg total) by mouth daily. 30 tablet 0  ezetimibe (ZETIA) 10 MG tablet Take 1 tablet (10 mg total) by mouth daily. 30 tablet 0   midodrine (PROAMATINE) 2.5 MG tablet Take 2.5 mg by mouth 2 (two) times daily with a meal.     mirtazapine (REMERON) 7.5 MG tablet Take 1 tablet (7.5 mg total) by mouth at bedtime. 30 tablet 2   mupirocin cream (BACTROBAN) 2 % Apply topically 2 (two) times daily.  (Patient taking differently: Apply topically as needed.) 15 g 0   pantoprazole (PROTONIX) 40 MG tablet Take 40 mg by mouth every morning.     rosuvastatin (CRESTOR) 20 MG tablet Take 1 tablet (20 mg total) by mouth daily. 30 tablet 0   No current facility-administered medications for this visit.    Medication Side Effects: None  Allergies: No Known Allergies  Past Medical History:  Diagnosis Date   Anxiety    Borderline hyperlipidemia    as of 10/2016: TC 134, TG 90, HDL 51, LDL 95 (PCP recently increase rosuvastatin dose from 10 to 20 mg)   Borderline hypertension    Coronary artery disease involving native coronary artery of native heart without angina pectoris 03/01/2020   [facility]:; Dr. Adin Hector:  multivessel CAD => 50% pLAD & 60% mLAD, 70% pLCx & 90% mLCx,  70% mid RCA => CABG x 4   Coronary artery disease with history of myocardial infarction without history of CABG    Depression    Family history of premature coronary artery disease 08/26/2017   Hx of CABG x 4 03/19/2020   [facility], [place], [place] -> CABG x4 (LIMA-LAD, SVG-OM1, SVG-OM2, SVG-dRCA   Hyperlipidemia due to dietary fat intake 08/26/2017    Past Medical History, Surgical history, Social history, and Family history were reviewed and updated as appropriate.   Please see review of systems for further details on the patient's review from today.   Objective:   Physical Exam:  There were no vitals taken for this visit.  Physical Exam Constitutional:      General: She is not in acute distress. Musculoskeletal:        General: No deformity.  Neurological:     Mental Status: She is alert and oriented to person, place, and time.     Coordination: Coordination normal.  Psychiatric:        Attention and Perception: Attention and perception normal. She does not perceive auditory or visual hallucinations.        Mood and Affect: Affect is not  labile, blunt, angry or inappropriate.        Speech: Speech normal.        Behavior: Behavior normal.        Thought Content: Thought content normal. Thought content is not paranoid or delusional. Thought content does not include homicidal or suicidal ideation. Thought content does not include homicidal or suicidal plan.        Cognition and Memory: Cognition and memory normal.     Comments: Insight intact     Lab Review:     Component Value Date/Time   NA 142 01/06/2023 1029   K 5.0 01/06/2023 1029   CL 105 01/06/2023 1029   CO2 24 01/06/2023 1029   GLUCOSE 99 01/06/2023 1029   GLUCOSE 99 09/20/2022 1545   BUN 14 01/06/2023 1029   CREATININE 0.72 01/06/2023 1029   CALCIUM 9.6 01/06/2023 1029   PROT 7.7 09/20/2022 1545   PROT 6.0 03/03/2022 0833   ALBUMIN 4.6 09/20/2022  1545   ALBUMIN 4.1 03/03/2022 0833   AST 30 09/20/2022 1545   ALT 21 09/20/2022 1545   ALKPHOS 69 09/20/2022 1545   BILITOT 0.7 09/20/2022 1545   BILITOT 0.5 03/03/2022 0833   GFRNONAA >60 09/20/2022 1545   GFRAA 95 10/16/2017 1412       Component Value Date/Time   WBC 6.2 01/06/2023 1029   WBC 11.2 (H) 09/20/2022 1545   RBC 4.49 01/06/2023 1029   RBC 4.41 09/20/2022 1545   HGB 13.9 01/06/2023 1029   HCT 43.7 01/06/2023 1029   PLT 266 01/06/2023 1029   MCV 97 01/06/2023 1029   MCH 31.0 01/06/2023 1029   MCH 30.8 09/20/2022 1545   MCHC 31.8 01/06/2023 1029   MCHC 33.3 09/20/2022 1545   RDW 12.2 01/06/2023 1029   LYMPHSABS 0.9 09/18/2022 1902   MONOABS 0.7 09/18/2022 1902   EOSABS 0.0 09/18/2022 1902   BASOSABS 0.0 09/18/2022 1902    No results found for: "POCLITH", "LITHIUM"   No results found for: "PHENYTOIN", "PHENOBARB", "VALPROATE", "CBMZ"   .res Assessment: Plan:    Plan:  PDMP reviewed  Buspar 5mg  BID Remeron 7.5 MG tablet - depression/anxiety Cogentin 0.5mg  BID - movement disorder.     Discussed medications and treatment plans with patient support. Patient totally disabled  and unable to work at this time. Patient receiving long term disability benefits.   RTC 4 weeks.  Patient advised to contact office with any questions, adverse effects, or acute worsening in signs and symptoms.   Discussed potential benefits, risk, and side effects of benzodiazepines to include potential risk of tolerance and dependence, as well as possible drowsiness. Advised patient not to drive if experiencing drowsiness and to take lowest possible effective dose to minimize risk of dependence and tolerance.   There are no diagnoses linked to this encounter.   Please see After Visit Summary for patient specific instructions.  Future Appointments  Date Time Provider Department Center  04/03/2023 11:00 AM Aneliz Carbary, Thereasa Solo, NP CP-[facility] None  04/29/2023  2:00 PM Waldron Session, [facility] CP-[facility] None  05/07/2023  1:00 PM [facility] VASC 2 [facility]  07/01/2023  2:00 PM Marykay Lex, MD [facility] None    No orders of the defined types were placed in this encounter.   -------------------------------

## 2023-04-07 ENCOUNTER — Other Ambulatory Visit: Payer: Self-pay | Admitting: Adult Health

## 2023-04-07 DIAGNOSIS — F331 Major depressive disorder, recurrent, moderate: Secondary | ICD-10-CM

## 2023-04-16 ENCOUNTER — Ambulatory Visit: Payer: Medicaid Other | Admitting: Cardiology

## 2023-04-29 ENCOUNTER — Ambulatory Visit: Payer: Medicaid Other | Admitting: Mental Health

## 2023-04-29 DIAGNOSIS — F4321 Adjustment disorder with depressed mood: Secondary | ICD-10-CM | POA: Diagnosis not present

## 2023-04-29 NOTE — Progress Notes (Signed)
 [facility]   Name: Heather Lucas Date: 04/29/23 MRN: [id number] DOB: [birth year] PCP: Cleatis Polka., MD   Time spent:  48 minutes   Treatment:  Ind. therapy   Mental Status Exam:         Appearance:    Casual      Behavior:   Appropriate   Motor:   WNL   Speech/Language:    Clear and Coherent   Affect:   Full range    Mood:   anxious   Thought process:   Logical, linear, goal directed   Thought content:     WNL   Sensory/Perceptual disturbances:     none   Orientation:   x4   Attention:   Good   Concentration:   Good   Memory:   Intact   Fund of knowledge:    Consistent with age and development   Insight:     Good   Judgment:    Good   Impulse Control:   Good       Reported Symptoms:  anxiety daily (10/10), depression (3/10)   Risk Assessment: Danger to Self:  No Self-injurious Behavior: No Danger to Others: No Duty to Warn:no Physical Aggression / Violence:No  Access to Firearms a concern: No  Gang Involvement:No  Patient / guardian was educated about steps to take if suicide or homicide risk level increases between visits: yes While future psychiatric events cannot be accurately predicted, the patient does not currently require acute inpatient psychiatric care and does not currently meet [facility] involuntary commitment criteria.   Medical History/Surgical History:     Past Medical History:  Diagnosis Date   Agatston coronary artery calcium score between 100 and 199 08/26/2017   Borderline hyperlipidemia      as of 10/2016: TC 134, TG 90, HDL 51, LDL 95 (PCP recently increase rosuvastatin dose from 10 to 20 mg)   Borderline hypertension     Coronary artery disease involving native coronary artery of native heart without angina pectoris 02/18/2020   Family history of premature coronary artery disease 08/26/2017   Hx of CABG x 4 06/17/2021   Hyperlipidemia due to dietary fat intake 08/26/2017   Ischemic cardiomyopathy 06/17/2021            Past Surgical History:  Procedure Laterality Date   CORONARY ARTERY BYPASS GRAFT   03/2020    [facility] [place], [place] -> reportedly CABG x4   LAPAROSCOPIC CHOLECYSTECTOMY   1995   LEFT HEART CATH AND CORONARY ANGIOGRAPHY   02/2020    [facility] [place], [place]) -> multivessel disease, referred for CABG   NM MYOVIEW LTD   02/2018    Clearly appears abnormal, referred for [facility] Junes, [place]   TRANSTHORACIC ECHOCARDIOGRAM   02/2020    Several echoes in January and February 2022-initially noted to be abnormal along with Myoview stress test, immediately post CABG recheck      Medications:       Current Outpatient Medications  Medication Sig Dispense Refill   aspirin 81 MG EC tablet Take 81 mg by mouth daily.       buPROPion (WELLBUTRIN XL) 300 MG 24 hr tablet Take 1 tablet (300 mg total) by mouth daily. 30 tablet 2   clonazePAM (KLONOPIN) 1 MG tablet TAKE ONE TABLET BY MOUTH TWICE DAILY 60 tablet 2   clopidogrel (PLAVIX) 75 MG tablet Take 75 mg by mouth daily.       ergocalciferol (VITAMIN D2) 1.25  MG (50000 UT) capsule Take 50,000 Units by mouth daily.       ezetimibe (ZETIA) 10 MG tablet Take 1 tablet (10 mg total) by mouth daily. 90 tablet 3   midodrine (PROAMATINE) 5 MG tablet Take 5 mg  in the morning  and take 2.5 mg ( 1/2 tablet ) in the evening.       mirtazapine (REMERON) 15 MG tablet Take 1 tablet (15 mg total) by mouth at bedtime. 30 tablet 2   rosuvastatin (CRESTOR) 20 MG tablet Take 20 mg by mouth daily.   5   Sennosides 15 MG TABS Take 15 mg by mouth daily. Takes 8 tablets a night       valbenazine (INGREZZA) 40 MG capsule Take 1 capsule (40 mg total) by mouth daily. 30 capsule 5   valbenazine (INGREZZA) 40 MG capsule Take 1 capsule (40 mg total) by mouth daily. 30 capsule 0    No current facility-administered medications for this visit.        Subjective:  Patient presents on time for today's session.  Patient presented sharing grief  related to the loss of her friend.  This session was centered around this loss where patient was able to provide details as she was with her for the last few weeks trying to provide care as she had a recent surgery.  She was also present when her friend needed 911 due to a medical event.  Facilitated her identifying and sharing feelings, provided support and explored ways she is coping during this time.  She stated the funeral is tomorrow and she plans to give remarks about her friend and values the opportunity.  We discussed how this can be helpful to the grief process.  Ways to cope and care for herself over the coming days was further explored collaboratively.  She plans to utilize her support system which consists of friends both at church and in the community.    Interventions: CBT, supportive therapy, motivational interviewing   Diagnoses:      ICD-10-CM    1. Major depressive disorder, recurrent episode, moderate (HCC)  F33.1                 Plan: Patient is to use CBT, mindfulness and coping skills to help manage decrease symptoms associated with their diagnosis.     Long-term goals:   Maintain symptom reduction: The patient will report sustained reduction in symptoms of anxiety using both CBT and mindfulness interventions for 3 consecutive months progressively.  Improve emotional regulation: The patient will learn and apply CBT and mindfulness-based strategies to regulate emotions and report an improvement in emotional regulation for at least 3 consecutive months progressively.   Short-term goal:  The patient will learn and apply CBT and mindfulness-based coping skills for managing anxiety and practice using it between sessions.       2.   Increase awareness of automatic thoughts: The patient will learn to identify automatic thoughts and increase awareness of their impact on emotions and behaviors through both CBT and mindfulness-based interventions.       3.    Identify,  challenge, and replace fearful/anxious/depressive self talk with positive, realistic, and empowering self talk that does not feed anxiety nor depression.         Waldron Session, [facility]

## 2023-05-05 ENCOUNTER — Other Ambulatory Visit: Payer: Self-pay | Admitting: Adult Health

## 2023-05-07 ENCOUNTER — Ambulatory Visit (HOSPITAL_COMMUNITY)
Admission: RE | Admit: 2023-05-07 | Discharge: 2023-05-07 | Disposition: A | Discharge status: Home / Self Care | Payer: Medicaid Other | Source: Ambulatory Visit | Attending: Cardiology | Admitting: Cardiology

## 2023-05-07 DIAGNOSIS — I255 Ischemic cardiomyopathy: Secondary | ICD-10-CM

## 2023-05-07 DIAGNOSIS — I2510 Atherosclerotic heart disease of native coronary artery without angina pectoris: Secondary | ICD-10-CM

## 2023-05-07 DIAGNOSIS — I959 Hypotension, unspecified: Secondary | ICD-10-CM

## 2023-05-07 DIAGNOSIS — R011 Cardiac murmur, unspecified: Secondary | ICD-10-CM | POA: Diagnosis present

## 2023-05-07 DIAGNOSIS — R0609 Other forms of dyspnea: Secondary | ICD-10-CM

## 2023-05-07 DIAGNOSIS — Z951 Presence of aortocoronary bypass graft: Secondary | ICD-10-CM

## 2023-05-07 LAB — ECHOCARDIOGRAM COMPLETE
AR max vel: 2.26 cm2
AV Area VTI: 1.6 cm2
AV Area mean vel: 2.01 cm2
AV Mean grad: 3 mmHg
AV Peak grad: 5 mmHg
Ao pk vel: 1.12 m/s
Area-P 1/2: 2.7 cm2
S' Lateral: 2.27 cm

## 2023-05-09 ENCOUNTER — Other Ambulatory Visit: Payer: Self-pay | Admitting: Adult Health

## 2023-05-09 DIAGNOSIS — F331 Major depressive disorder, recurrent, moderate: Secondary | ICD-10-CM

## 2023-05-25 ENCOUNTER — Encounter: Payer: Self-pay | Admitting: Adult Health

## 2023-05-25 ENCOUNTER — Ambulatory Visit: Payer: Medicaid Other | Admitting: Adult Health

## 2023-05-25 DIAGNOSIS — F411 Generalized anxiety disorder: Secondary | ICD-10-CM

## 2023-05-25 DIAGNOSIS — G2401 Drug induced subacute dyskinesia: Secondary | ICD-10-CM | POA: Diagnosis not present

## 2023-05-25 DIAGNOSIS — G4700 Insomnia, unspecified: Secondary | ICD-10-CM

## 2023-05-25 DIAGNOSIS — F331 Major depressive disorder, recurrent, moderate: Secondary | ICD-10-CM | POA: Diagnosis not present

## 2023-05-25 MED ORDER — BUSPIRONE HCL 5 MG PO TABS: 5.0000 mg | ORAL_TABLET | Freq: Two times a day (BID) | ORAL | 2 refills | Status: DC

## 2023-05-25 MED ORDER — MIRTAZAPINE 7.5 MG PO TABS: 7.5000 mg | ORAL_TABLET | Freq: Every day | ORAL | 2 refills | Status: DC

## 2023-05-25 MED ORDER — BENZTROPINE MESYLATE 0.5 MG PO TABS: 0.5000 mg | ORAL_TABLET | Freq: Two times a day (BID) | ORAL | 2 refills | Status: DC

## 2023-05-25 NOTE — Progress Notes (Signed)
 Heather Lucas [id number] 10-29-1960 63 y.o.  Subjective:   Patient ID:  Heather Lucas is a 63 y.o. (DOB 09/03/1960) female.  Chief Complaint: No chief complaint on file.   HPI Heather Lucas presents to the office today for follow-up of MDD, GAD, panic attacks and TD.  Describes mood today as "so-so". Pleasant. Flat affect. Reports some tearfulness. Mood symptoms - reports depression. Reports varying interest and motivation. Reports she is having to push herself to do things or will on the couch. Reports anxiety - "every day, most all of the day". Denies irritability. Reports daily panic attacks - getting triggered. Reports worry, rumination and over thinking. Denies obsessive thoughts. Reports mood as "ok". Stating "I feel like I'm doing fair". Denies recent falls. Feels like current medications are helpful. Taking medications as prescribed.  Energy levels "alright". Active, does not have a regular exercise routine.  Enjoys some usual interests and activities. Single. Lives alone. Attends church twice a week. Reports attending a bible study. Brother in [place]. Friends local. Appetite adequate. Weight gain - 118 to 122 pounds - 61".  Sleeps well at night. Averages 8 to 10 hours.   Reports ongoing difficulties with focus and concentration.  Does not feel like she is able to return to work at this time with her anxiety and memory issues. Reports struggling to keep home in order. Completing minimal tasks at home. Does not feel like she is capable of working a job. Has applied for LTD and social security benefits. Patient totally disabled and unable to work at this time. Denies SI and HI. Denies AH or AH. Denies self harm. Denies substance use.  Previous medication trials:  Zoloft, Prozac, Wellbutrin, Lexapro, Trintellix, Hydroxyzine.    AIMS    Flowsheet Row Admission (Discharged) from 09/21/2022 in [facility] BEHAVIORAL MEDICINE ED from 09/20/2022 in [facility] Emergency Department  at [facility] Office Visit from 12/13/2021 in [facility]  AIMS Total Score 14 14 28       AUDIT    Flowsheet Row Admission (Discharged) from 09/21/2022 in [facility] BEHAVIORAL MEDICINE  Alcohol Use Disorder Identification Test Final Score (AUDIT) 0      Mini-Mental    Flowsheet Row Office Visit from 07/30/2022 in [facility]  Total Score (max 30 points ) 30      Flowsheet Row Admission (Discharged) from 09/21/2022 in [facility] BE[facility]AVIORAL MEDICINE ED from 09/20/2022 in [facility] Emergency Department at [facility] ED from 09/18/2022 in [facility] Emergency Department at [facility]  C-SSRS RISK CATEGORY Moderate Risk Moderate Risk No Risk        Review of Systems:  Review of Systems  Musculoskeletal:  Negative for gait problem.  Neurological:  Negative for tremors.  Psychiatric/Behavioral:         Please refer to HPI    Medications: I have reviewed the patient's current medications.  Current Outpatient Medications  Medication Sig Dispense Refill   benztropine (COGENTIN) 0.5 MG tablet Take 1 tablet (0.5 mg total) by mouth 2 (two) times daily. 60 tablet 0   busPIRone (BUSPAR) 5 MG tablet Take 1 tablet (5 mg total) by mouth 2 (two) times daily. 60 tablet 0   clopidogrel (PLAVIX) 75 MG tablet Take 1 tablet (75 mg total) by mouth daily. 30 tablet 0   ezetimibe (ZETIA) 10 MG tablet Take 1 tablet (10 mg total) by mouth daily. 30 tablet 0   midodrine (PROAMATINE) 2.5 MG tablet Take  2.5 mg by mouth 2 (two) times daily with a meal.     mirtazapine (REMERON) 7.5 MG tablet Take 1 tablet (7.5 mg total) by mouth at bedtime. 30 tablet 1   mupirocin cream (BACTROBAN) 2 % Apply topically 2 (two) times daily. (Patient taking differently: Apply topically as needed.) 15 g 0   pantoprazole (PROTONIX) 40 MG tablet Take 40 mg by mouth every morning.     rosuvastatin (CRESTOR) 20 MG tablet Take 1 tablet  (20 mg total) by mouth daily. 30 tablet 0   No current facility-administered medications for this visit.    Medication Side Effects: None  Allergies: No Known Allergies  Past Medical History:  Diagnosis Date   Anxiety    Borderline hyperlipidemia    as of 10/2016: TC 134, TG 90, HDL 51, LDL 95 (PCP recently increase rosuvastatin dose from 10 to 20 mg)   Borderline hypertension    Coronary artery disease involving native coronary artery of native heart without angina pectoris 03/01/2020   [facility]:; Dr. Adin Hector:  multivessel CAD => 50% pLAD & 60% mLAD, 70% pLCx & 90% mLCx,  70% mid RCA => CABG x 4   Coronary artery disease with history of myocardial infarction without history of CABG    Depression    Family history of premature coronary artery disease 08/26/2017   Hx of CABG x 4 03/19/2020   [facility], [place], [place] -> CABG x4 (LIMA-LAD, SVG-OM1, SVG-OM2, SVG-dRCA   Hyperlipidemia due to dietary fat intake 08/26/2017    Past Medical History, Surgical history, Social history, and Family history were reviewed and updated as appropriate.   Please see review of systems for further details on the patient's review from today.   Objective:   Physical Exam:  There were no vitals taken for this visit.  Physical Exam Constitutional:      General: She is not in acute distress. Musculoskeletal:        General: No deformity.  Neurological:     Mental Status: She is alert and oriented to person, place, and time.     Coordination: Coordination normal.  Psychiatric:        Attention and Perception: Attention and perception normal. She does not perceive auditory or visual hallucinations.        Mood and Affect: Mood normal. Mood is not anxious or depressed. Affect is not labile, blunt, angry or inappropriate.        Speech: Speech normal.        Behavior: Behavior normal.        Thought Content: Thought content  normal. Thought content is not paranoid or delusional. Thought content does not include homicidal or suicidal ideation. Thought content does not include homicidal or suicidal plan.        Cognition and Memory: Cognition and memory normal.        Judgment: Judgment normal.     Comments: Insight intact     Lab Review:     Component Value Date/Time   NA 142 01/06/2023 1029   K 5.0 01/06/2023 1029   CL 105 01/06/2023 1029   CO2 24 01/06/2023 1029   GLUCOSE 99 01/06/2023 1029   GLUCOSE 99 09/20/2022 1545   BUN 14 01/06/2023 1029   CREATININE 0.72 01/06/2023 1029   CALCIUM 9.6 01/06/2023 1029   PROT 7.7 09/20/2022 1545   PROT 6.0 03/03/2022 0833   ALBUMIN 4.6 09/20/2022 1545   ALBUMIN 4.1 03/03/2022 1610  AST 30 09/20/2022 1545   ALT 21 09/20/2022 1545   ALKPHOS 69 09/20/2022 1545   BILITOT 0.7 09/20/2022 1545   BILITOT 0.5 03/03/2022 0833   GFRNONAA >60 09/20/2022 1545   GFRAA 95 10/16/2017 1412       Component Value Date/Time   WBC 6.2 01/06/2023 1029   WBC 11.2 (H) 09/20/2022 1545   RBC 4.49 01/06/2023 1029   RBC 4.41 09/20/2022 1545   HGB 13.9 01/06/2023 1029   HCT 43.7 01/06/2023 1029   PLT 266 01/06/2023 1029   MCV 97 01/06/2023 1029   MCH 31.0 01/06/2023 1029   MCH 30.8 09/20/2022 1545   MCHC 31.8 01/06/2023 1029   MCHC 33.3 09/20/2022 1545   RDW 12.2 01/06/2023 1029   LYMPHSABS 0.9 09/18/2022 1902   MONOABS 0.7 09/18/2022 1902   EOSABS 0.0 09/18/2022 1902   BASOSABS 0.0 09/18/2022 1902    No results found for: "POCLITH", "LITHIUM"   No results found for: "PHENYTOIN", "PHENOBARB", "VALPROATE", "CBMZ"   .res Assessment: Plan:    Plan:  PDMP reviewed  Buspar 5mg  BID Remeron 7.5 MG tablet - depression/anxiety Cogentin 0.5mg  BID - movement disorder.     Discussed medications and treatment plans with patient. Patient totally disabled and unable to work at this time   with her anxiety, panic attacks and cognitive deficits.    RTC 4 weeks.  25  minutes spent dedicated to the care of this patient on the date of this encounter to include pre-visit review of records, ordering of medication, post visit documentation, and face-to-face time with the patient discussing MDD, GAD, panic attacks and TD. Discussed continuing current medication regimen.  Patient advised to contact office with any questions, adverse effects, or acute worsening in signs and symptoms.   Discussed potential benefits, risk, and side effects of benzodiazepines to include potential risk of tolerance and dependence, as well as possible drowsiness. Advised patient not to drive if experiencing drowsiness and to take lowest possible effective dose to minimize risk of dependence and tolerance.   There are no diagnoses linked to this encounter.   Please see After Visit Summary for patient specific instructions.  Future Appointments  Date Time Provider Department Center  05/25/2023  1:30 PM Vance Belcourt, Ursula Gardner, NP [facility] None  05/28/2023  2:00 PM Avram Lenis, [facility] None  07/01/2023  2:00 PM Arleen Lacer, MD [facility] None    No orders of the defined types were placed in this encounter.   -------------------------------

## 2023-05-27 ENCOUNTER — Telehealth: Payer: Self-pay | Admitting: Cardiology

## 2023-05-27 NOTE — Telephone Encounter (Signed)
 Dr. Bard Boor with [facility] called in asking to speak with Dr. Addie Holstein at his earliest convenience.

## 2023-05-27 NOTE — Telephone Encounter (Signed)
 Left message to call back. Need more information-why is he calling?

## 2023-05-28 ENCOUNTER — Ambulatory Visit: Admitting: Mental Health

## 2023-05-28 DIAGNOSIS — F410 Panic disorder [episodic paroxysmal anxiety] without agoraphobia: Secondary | ICD-10-CM | POA: Diagnosis not present

## 2023-05-28 DIAGNOSIS — F411 Generalized anxiety disorder: Secondary | ICD-10-CM | POA: Diagnosis not present

## 2023-05-28 DIAGNOSIS — F331 Major depressive disorder, recurrent, moderate: Secondary | ICD-10-CM | POA: Diagnosis not present

## 2023-05-28 NOTE — Progress Notes (Signed)
 [facility]   Name: Heather Lucas Date: 05/28/23 MRN: [id number] DOB: [birth year] PCP: Cleatis Polka., MD   Time spent:  51 minutes   Treatment:  Ind. therapy   Mental Status Exam:         Appearance:    Casual      Behavior:   Appropriate   Motor:   WNL   Speech/Language:    Clear and Coherent   Affect:   Full range    Mood:   anxious pleasant   Thought process:   Logical, linear, goal directed   Thought content:     WNL   Sensory/Perceptual disturbances:     none   Orientation:   x4   Attention:   Good   Concentration:   Good   Memory:   Intact   Fund of knowledge:    Consistent with age and development   Insight:     Good   Judgment:    Good   Impulse Control:   Good       Reported Symptoms:  anxiety daily (10/10), depression (3/10)   Risk Assessment: Danger to Self:  No Self-injurious Behavior: No Danger to Others: No Duty to Warn:no Physical Aggression / Violence:No  Access to Firearms a concern: No  Gang Involvement:No  Patient / guardian was educated about steps to take if suicide or homicide risk level increases between visits: yes While future psychiatric events cannot be accurately predicted, the patient does not currently require acute inpatient psychiatric care and does not currently meet [facility] involuntary commitment criteria.   Medical History/Surgical History:     Past Medical History:  Diagnosis Date   Agatston coronary artery calcium score between 100 and 199 08/26/2017   Borderline hyperlipidemia      as of 10/2016: TC 134, TG 90, HDL 51, LDL 95 (PCP recently increase rosuvastatin dose from 10 to 20 mg)   Borderline hypertension     Coronary artery disease involving native coronary artery of native heart without angina pectoris 02/18/2020   Family history of premature coronary artery disease 08/26/2017   Hx of CABG x 4 06/17/2021   Hyperlipidemia due to dietary fat intake 08/26/2017   Ischemic cardiomyopathy 06/17/2021            Past Surgical History:  Procedure Laterality Date   CORONARY ARTERY BYPASS GRAFT   03/2020    [facility], [place], [place] -> reportedly CABG x4   LAPAROSCOPIC CHOLECYSTECTOMY   1995   LEFT HEART CATH AND CORONARY ANGIOGRAPHY   02/2020    [facility] [place], [place]) -> multivessel disease, referred for CABG   NM MYOVIEW LTD   02/2018    Clearly appears abnormal, referred for [facility] Junes, [place]   TRANSTHORACIC ECHOCARDIOGRAM   02/2020    Several echoes in January and February 2022-initially noted to be abnormal along with Myoview stress test, immediately post CABG recheck      Medications:       Current Outpatient Medications  Medication Sig Dispense Refill   aspirin 81 MG EC tablet Take 81 mg by mouth daily.       buPROPion (WELLBUTRIN XL) 300 MG 24 hr tablet Take 1 tablet (300 mg total) by mouth daily. 30 tablet 2   clonazePAM (KLONOPIN) 1 MG tablet TAKE ONE TABLET BY MOUTH TWICE DAILY 60 tablet 2   clopidogrel (PLAVIX) 75 MG tablet Take 75 mg by mouth daily.       ergocalciferol (VITAMIN D2)  1.25 MG (50000 UT) capsule Take 50,000 Units by mouth daily.       ezetimibe (ZETIA) 10 MG tablet Take 1 tablet (10 mg total) by mouth daily. 90 tablet 3   midodrine (PROAMATINE) 5 MG tablet Take 5 mg  in the morning  and take 2.5 mg ( 1/2 tablet ) in the evening.       mirtazapine (REMERON) 15 MG tablet Take 1 tablet (15 mg total) by mouth at bedtime. 30 tablet 2   rosuvastatin (CRESTOR) 20 MG tablet Take 20 mg by mouth daily.   5   Sennosides 15 MG TABS Take 15 mg by mouth daily. Takes 8 tablets a night       valbenazine (INGREZZA) 40 MG capsule Take 1 capsule (40 mg total) by mouth daily. 30 capsule 5   valbenazine (INGREZZA) 40 MG capsule Take 1 capsule (40 mg total) by mouth daily. 30 capsule 0    No current facility-administered medications for this visit.        Subjective:  Patient presents on time for today's session.  Shared progress.  She  stated that she followed through with speaking at her friends funeral service.  She went on to share how she was nervous but that she feels it went well and was encouraged by her friend's family.  She continues to have panic episodes, typically 1/day, some less severe than others.  Explored ways to cope, trying to manage stressors that may be contributing factors.  She continues to cope with financial stress, is awaiting long-term disability determination later this month and is hopeful that he will be granted as she has limited funds, not enough to pay all of her bills.  She stated that her friend's family may work with her about housing, allowing her to pay rent and live in her friend's townhome based on her income, which would be significantly helpful.  She says she plans to apply for section 8 housing which may take upwards of 9 to 10 months for vacancy to come open.  Facilitated her identifying needs and how she feels she is pursuing through these challenges where she stated she values her friendships and church family who have been tremendously helpful along with her friend who passed recently.  She plans to look for part-time employment to continue to alleviate her financial stress.   Interventions: CBT, supportive therapy, motivational interviewing   Diagnoses:  Panic disorder Generalized anxiety disorder Major depressive disorder, recurrent, moderate   Plan: Patient is to use CBT, mindfulness and coping skills to help manage decrease symptoms associated with their diagnosis.     Long-term goals:   Maintain symptom reduction: The patient will report sustained reduction in symptoms of anxiety using both CBT and mindfulness interventions for 3 consecutive months progressively.  Improve emotional regulation: The patient will learn and apply CBT and mindfulness-based strategies to regulate emotions and report an improvement in emotional regulation for at least 3 consecutive months  progressively.   Short-term goal:  The patient will learn and apply CBT and mindfulness-based coping skills for managing anxiety and practice using it between sessions.       2.   Increase awareness of automatic thoughts: The patient will learn to identify automatic thoughts and increase awareness of their impact on emotions and behaviors through both CBT and mindfulness-based interventions.       3.    Identify coping related to managing her panic attacks and decrease frequency of the attacks from daily to 0-1  times per week         Avram Lenis, [facility]

## 2023-05-28 NOTE — Telephone Encounter (Signed)
 Attempted to call Dr. Bard Boor, no answer left message requesting call back

## 2023-06-01 ENCOUNTER — Telehealth: Payer: Self-pay

## 2023-06-01 NOTE — Telephone Encounter (Signed)
 Heather Lucas

## 2023-06-01 NOTE — Telephone Encounter (Signed)
 Dr. Addie Holstein not in office today.

## 2023-06-10 ENCOUNTER — Telehealth: Payer: Self-pay | Admitting: Neurology

## 2023-06-10 NOTE — Telephone Encounter (Signed)
 Pt called to confirm appointment details of previous appointment with Dr Gracie Lav

## 2023-06-23 ENCOUNTER — Encounter: Payer: Self-pay | Admitting: Adult Health

## 2023-06-23 ENCOUNTER — Ambulatory Visit: Admitting: Adult Health

## 2023-06-23 DIAGNOSIS — F410 Panic disorder [episodic paroxysmal anxiety] without agoraphobia: Secondary | ICD-10-CM | POA: Diagnosis not present

## 2023-06-23 DIAGNOSIS — F411 Generalized anxiety disorder: Secondary | ICD-10-CM | POA: Diagnosis not present

## 2023-06-23 DIAGNOSIS — G4700 Insomnia, unspecified: Secondary | ICD-10-CM

## 2023-06-23 DIAGNOSIS — F331 Major depressive disorder, recurrent, moderate: Secondary | ICD-10-CM

## 2023-06-23 DIAGNOSIS — G2401 Drug induced subacute dyskinesia: Secondary | ICD-10-CM

## 2023-06-23 NOTE — Progress Notes (Signed)
 Heather Lucas [id number] Oct 19, 1960 63 y.o.  Subjective:   Patient ID:  Heather Lucas is a 63 y.o. (DOB 1961-01-26) female.  Chief Complaint: No chief complaint on file.   HPI Heather Lucas presents to the office today for follow-up of MDD, GAD, panic attacks and TD.  Describes mood today as "ok". Pleasant. Reports some tearfulness. Mood symptoms - reports depression. Reports interest and motivation is fair. Reports she is having to push herself to do things - cleaning, cooking and laundry. Reports ongoing anxiety - "every day". Reports having to lie down a few times a day to let it pass. Denies irritability. Reports daily panic attacks. Reports worry, rumination and over thinking - "about my future and living situation". Denies obsessive thoughts. Reports mood as variable. Stating "I feel like I'm doing fair". Feels like current medications are helpful. Taking medications as prescribed.  Energy levels "fair". Active, does not have a regular exercise routine. Enjoys some usual interests and activities. Single. Lives alone. Attends church twice a week. Reports attending a bible study. Brother in [place] . Friends local. Appetite adequate. Weight stable - 122 pounds - 61".  Sleeps well at night. Averages 8 to 10 hours.   Reports difficulties with focus and concentration. Does not feel like she is able to return to work due to her anxiety and memory issues - having to have things repeated - forgetful. Stating "it's hard to hold onto information". Reports trying to keep home in order. Completing minimal tasks at home. Has applied for LTD and social security disability benefits.  Denies SI and HI. Denies AH or AH. Denies self harm. Denies substance use.  Previous medication trials:  Zoloft, Prozac, Wellbutrin , Lexapro, Trintellix , Hydroxyzine .    AIMS    Flowsheet Row Admission (Discharged) from 09/21/2022 in [facility] BEHAVIORAL MEDICINE ED from 09/20/2022 in [facility] Emergency  Department at [facility] Office Visit from 12/13/2021 in [facility]  AIMS Total Score 14 14 28       AUDIT    Flowsheet Row Admission (Discharged) from 09/21/2022 in [facility] BEHAVIORAL MEDICINE  Alcohol  Use Disorder Identification Test Final Score (AUDIT) 0      Mini-Mental    Flowsheet Row Office Visit from 07/30/2022 in [facility]  Total Score (max 30 points ) 30      Flowsheet Row Admission (Discharged) from 09/21/2022 in [facility] BEHAVIORAL MEDICINE ED from 09/20/2022 in [facility] Emergency Department at [facility] ED from 09/18/2022 in [facility] Emergency Department at [facility]  C-SSRS RISK CATEGORY Moderate Risk Moderate Risk No Risk        Review of Systems:  Review of Systems  Musculoskeletal:  Negative for gait problem.  Neurological:  Negative for tremors.  Psychiatric/Behavioral:         Please refer to HPI    Medications: I have reviewed the patient's current medications.  Current Outpatient Medications  Medication Sig Dispense Refill   benztropine  (COGENTIN ) 0.5 MG tablet Take 1 tablet (0.5 mg total) by mouth 2 (two) times daily. 60 tablet 2   busPIRone  (BUSPAR ) 5 MG tablet Take 1 tablet (5 mg total) by mouth 2 (two) times daily. 60 tablet 2   clopidogrel  (PLAVIX ) 75 MG tablet Take 1 tablet (75 mg total) by mouth daily. 30 tablet 0   ezetimibe  (ZETIA ) 10 MG tablet Take 1 tablet (10 mg total) by mouth daily. 30 tablet 0   midodrine  (PROAMATINE ) 2.5 MG tablet Take 2.5  mg by mouth 2 (two) times daily with a meal.     mirtazapine  (REMERON ) 7.5 MG tablet Take 1 tablet (7.5 mg total) by mouth at bedtime. 30 tablet 2   mupirocin  cream (BACTROBAN ) 2 % Apply topically 2 (two) times daily. (Patient taking differently: Apply topically as needed.) 15 g 0   pantoprazole (PROTONIX) 40 MG tablet Take 40 mg by mouth every morning.     rosuvastatin  (CRESTOR ) 20 MG tablet Take  1 tablet (20 mg total) by mouth daily. 30 tablet 0   No current facility-administered medications for this visit.    Medication Side Effects: None  Allergies: No Known Allergies  Past Medical History:  Diagnosis Date   Anxiety    Borderline hyperlipidemia    as of 10/2016: TC 134, TG 90, HDL 51, LDL 95 (PCP recently increase rosuvastatin  dose from 10 to 20 mg)   Borderline hypertension    Coronary artery disease involving native coronary artery of native heart without angina pectoris 03/01/2020   [facility] [place]  [facility]:; Dr. Monika Annas:  multivessel CAD => 50% pLAD & 60% mLAD, 70% pLCx & 90% mLCx,  70% mid RCA => CABG x 4   Coronary artery disease with history of myocardial infarction without history of CABG    Depression    Family history of premature coronary artery disease 08/26/2017   Hx of CABG x 4 03/19/2020   [facility] [place], [place]  -> CABG x4 (LIMA-LAD, SVG-OM1, SVG-OM2, SVG-dRCA   Hyperlipidemia due to dietary fat intake 08/26/2017    Past Medical History, Surgical history, Social history, and Family history were reviewed and updated as appropriate.   Please see review of systems for further details on the patient's review from today.   Objective:   Physical Exam:  There were no vitals taken for this visit.  Physical Exam Constitutional:      General: She is not in acute distress. Musculoskeletal:        General: No deformity.  Neurological:     Mental Status: She is alert and oriented to person, place, and time.     Coordination: Coordination normal.  Psychiatric:        Attention and Perception: Attention and perception normal. She does not perceive auditory or visual hallucinations.        Mood and Affect: Affect is not labile, blunt, angry or inappropriate.        Speech: Speech normal.        Behavior: Behavior normal.        Thought Content: Thought content normal. Thought content is not paranoid or  delusional. Thought content does not include homicidal or suicidal ideation. Thought content does not include homicidal or suicidal plan.        Cognition and Memory: Cognition and memory normal.        Judgment: Judgment normal.     Comments: Insight intact     Lab Review:     Component Value Date/Time   NA 142 01/06/2023 1029   K 5.0 01/06/2023 1029   CL 105 01/06/2023 1029   CO2 24 01/06/2023 1029   GLUCOSE 99 01/06/2023 1029   GLUCOSE 99 09/20/2022 1545   BUN 14 01/06/2023 1029   CREATININE 0.72 01/06/2023 1029   CALCIUM  9.6 01/06/2023 1029   PROT 7.7 09/20/2022 1545   PROT 6.0 03/03/2022 0833   ALBUMIN 4.6 09/20/2022 1545   ALBUMIN 4.1 03/03/2022 0833   AST 30 09/20/2022 1545   ALT  21 09/20/2022 1545   ALKPHOS 69 09/20/2022 1545   BILITOT 0.7 09/20/2022 1545   BILITOT 0.5 03/03/2022 0833   GFRNONAA >60 09/20/2022 1545   GFRAA 95 10/16/2017 1412       Component Value Date/Time   WBC 6.2 01/06/2023 1029   WBC 11.2 (H) 09/20/2022 1545   RBC 4.49 01/06/2023 1029   RBC 4.41 09/20/2022 1545   HGB 13.9 01/06/2023 1029   HCT 43.7 01/06/2023 1029   PLT 266 01/06/2023 1029   MCV 97 01/06/2023 1029   MCH 31.0 01/06/2023 1029   MCH 30.8 09/20/2022 1545   MCHC 31.8 01/06/2023 1029   MCHC 33.3 09/20/2022 1545   RDW 12.2 01/06/2023 1029   LYMPHSABS 0.9 09/18/2022 1902   MONOABS 0.7 09/18/2022 1902   EOSABS 0.0 09/18/2022 1902   BASOSABS 0.0 09/18/2022 1902    No results found for: "POCLITH", "LITHIUM"   No results found for: "PHENYTOIN", "PHENOBARB", "VALPROATE", "CBMZ"   .res Assessment: Plan:    Plan:  PDMP reviewed  Buspar  5mg  BID Remeron  7.5 MG tablet - depression/anxiety Cogentin  0.5mg  BID - movement disorder.     Discussed medications and treatment plans with patient. Patient totally disabled and unable to work at this time with her anxiety, panic attacks and cognitive deficits.    RTC 4 weeks.  25 minutes spent dedicated to the care of this patient  on the date of this encounter to include pre-visit review of records, ordering of medication, post visit documentation, and face-to-face time with the patient discussing MDD, GAD, panic attacks and TD. Discussed continuing current medication regimen.  Patient advised to contact office with any questions, adverse effects, or acute worsening in signs and symptoms.   Discussed potential benefits, risk, and side effects of benzodiazepines to include potential risk of tolerance and dependence, as well as possible drowsiness. Advised patient not to drive if experiencing drowsiness and to take lowest possible effective dose to minimize risk of dependence and tolerance.   There are no diagnoses linked to this encounter.   Please see After Visit Summary for patient specific instructions.  Future Appointments  Date Time Provider Department Center  06/29/2023  2:00 PM Avram Lenis, [facility] None  07/01/2023  2:00 PM Arleen Lacer, MD [facility]    No orders of the defined types were placed in this encounter.   -------------------------------

## 2023-06-29 ENCOUNTER — Ambulatory Visit: Admitting: Mental Health

## 2023-06-29 DIAGNOSIS — F411 Generalized anxiety disorder: Secondary | ICD-10-CM

## 2023-06-29 DIAGNOSIS — F331 Major depressive disorder, recurrent, moderate: Secondary | ICD-10-CM | POA: Diagnosis not present

## 2023-06-29 NOTE — Progress Notes (Signed)
 [facility]   Name: Norely Schlick Date: 06/29/23 MRN: [id number] DOB: 1960-03-22 PCP: Jeannine Milroy., MD   Time spent:  50 minutes   Treatment:  Ind. therapy   Mental Status Exam:         Appearance:    Casual      Behavior:   Appropriate   Motor:   WNL   Speech/Language:    Clear and Coherent   Affect:   Full range    Mood:   anxious pleasant   Thought process:   Logical, linear, goal directed   Thought content:     WNL   Sensory/Perceptual disturbances:     none   Orientation:   x4   Attention:   Good   Concentration:   Good   Memory:   Intact   Fund of knowledge:    Consistent with age and development   Insight:     Good   Judgment:    Good   Impulse Control:   Good       Reported Symptoms:  anxiety daily (10/10), depression (3/10)   Risk Assessment: Danger to Self:  No Self-injurious Behavior: No Danger to Others: No Duty to Warn:no Physical Aggression / Violence:No  Access to Firearms a concern: No  Gang Involvement:No  Patient / guardian was educated about steps to take if suicide or homicide risk level increases between visits: yes While future psychiatric events cannot be accurately predicted, the patient does not currently require acute inpatient psychiatric care and does not currently meet [place]  involuntary commitment criteria.          Medications:       Current Outpatient Medications  Medication Sig Dispense Refill   aspirin 81 MG EC tablet Take 81 mg by mouth daily.       buPROPion  (WELLBUTRIN  XL) 300 MG 24 hr tablet Take 1 tablet (300 mg total) by mouth daily. 30 tablet 2   clonazePAM  (KLONOPIN ) 1 MG tablet TAKE ONE TABLET BY MOUTH TWICE DAILY 60 tablet 2   clopidogrel  (PLAVIX ) 75 MG tablet Take 75 mg by mouth daily.       ergocalciferol  (VITAMIN D2) 1.25 MG (50000 UT) capsule Take 50,000 Units by mouth daily.       ezetimibe  (ZETIA ) 10 MG tablet Take 1 tablet (10 mg total) by mouth daily. 90 tablet 3   midodrine   (PROAMATINE ) 5 MG tablet Take 5 mg  in the morning  and take 2.5 mg ( 1/2 tablet ) in the evening.       mirtazapine  (REMERON ) 15 MG tablet Take 1 tablet (15 mg total) by mouth at bedtime. 30 tablet 2   rosuvastatin  (CRESTOR ) 20 MG tablet Take 20 mg by mouth daily.   5   Sennosides 15 MG TABS Take 15 mg by mouth daily. Takes 8 tablets a night       valbenazine  (INGREZZA ) 40 MG capsule Take 1 capsule (40 mg total) by mouth daily. 30 capsule 5   valbenazine  (INGREZZA ) 40 MG capsule Take 1 capsule (40 mg total) by mouth daily. 30 capsule 0    No current facility-administered medications for this visit.        Subjective:  Patient presents on time for today's session.  Assessed progress where she shared how she continues to cope with various life stressors.  She stated she continues to await disability determination.  Ongoing financial stress was shared, she currently has a place to stay but is also  applying for section 8 housing in the hopes to secure an apartment in the coming months.  She reports ongoing anxiety, rumination and discussed coping skills, thought stopping.  She reports her anxiety has increased over the last few weeks due to various uncertainties.  Continues to report some struggle with short-term memory challenges.  Gave her homework assignment related to thought stopping and reframing, radical acceptance.  She denied any concerns related to medication currently and agreed to contact our office between visits as needed.   Interventions: CBT, supportive therapy, motivational interviewing   Diagnoses:  Panic disorder Generalized anxiety disorder Major depressive disorder, recurrent, moderate   Plan: Patient is to use CBT, mindfulness and coping skills to help manage decrease symptoms associated with their diagnosis.     Long-term goals:   Maintain symptom reduction: The patient will report sustained reduction in symptoms of anxiety using both CBT and mindfulness interventions  for 3 consecutive months progressively.  Improve emotional regulation: The patient will learn and apply CBT and mindfulness-based strategies to regulate emotions and report an improvement in emotional regulation for at least 3 consecutive months progressively.   Short-term goal:  The patient will learn and apply CBT and mindfulness-based coping skills for managing anxiety and practice using it between sessions.       2.   Increase awareness of automatic thoughts: The patient will learn to identify automatic thoughts and increase awareness of their impact on emotions and behaviors through both CBT and mindfulness-based interventions.       3.    Identify coping related to managing her panic attacks and decrease frequency of the attacks from daily to 0-1 times per week         Avram Lenis, [facility]

## 2023-07-01 ENCOUNTER — Encounter: Payer: Self-pay | Admitting: Cardiology

## 2023-07-01 ENCOUNTER — Ambulatory Visit: Payer: Medicaid Other | Attending: Cardiology | Admitting: Cardiology

## 2023-07-01 VITALS — BP 103/58 | HR 67 | Ht 61.0 in | Wt 129.4 lb

## 2023-07-01 DIAGNOSIS — E785 Hyperlipidemia, unspecified: Secondary | ICD-10-CM | POA: Insufficient documentation

## 2023-07-01 DIAGNOSIS — R0609 Other forms of dyspnea: Secondary | ICD-10-CM | POA: Insufficient documentation

## 2023-07-01 DIAGNOSIS — I2510 Atherosclerotic heart disease of native coronary artery without angina pectoris: Secondary | ICD-10-CM | POA: Diagnosis present

## 2023-07-01 DIAGNOSIS — Z951 Presence of aortocoronary bypass graft: Secondary | ICD-10-CM | POA: Diagnosis present

## 2023-07-01 NOTE — Progress Notes (Signed)
 Cardiology Office Note:  .   Date:  07/03/2023  ID:  Heather Lucas, DOB 23-Sep-1960, MRN [id number] PCP/Referring Provider: Jeannine Milroy., MD  [facility] Providers Cardiologist:  Randene Bustard, MD     Chief Complaint  Patient presents with   Follow-up    Discussed results of Echo and Myoview  Blood pressures have been stable   Coronary Artery Disease    No angina.  Myoview  reviewed    Patient Profile: .     Heather Lucas is a 63 y.o. female  with a PMH notable for multivessel CAD-CABG with resolved ICM who presents here for 20-month follow-up to discuss results of echocardiogram and Myoview  at the request of Katlyn West, NP who saw her last in February 2025.  CAD-CABG x 4 (February 2022 in [place]) with initial ICM-now resolved with EF 60 to 65% now. 08/2017: Coronary CTA-CAC 148 with mild nonobstructive CAD. January 2022: Echo and stress ordered for exertional dyspnea as anginal: => abnormal Myoview  => cath   03/01/2020 Cardiac Catheterization, Dr. Monika Annas multivessel CAD => 50% pLAD & 60% mLAD, 70% pLCx & 90% mLCx,  70% mid RCA => referred for CABG. 03/19/2020: CABG x 4 (LIMA-LAD, SVG-OM1, SVG-OM2, SVG-dRCA)   Hypotension as opposed to hypertension Hyperlipidemia on rosuvastatin  Bipolar Disorder now complicated by Tardive Dyskinesia 2/2  to use of antipsychotics    I saw [name] a year ago on Jul 02, 2022 for 60-month follow-up.  No major cardiac issues.  Just some dizziness when she was dealing with strep throat.  No changes made.  Just labs ordered.  She was most recently seen by Katlyn West, NP on April 02, 2023 mostly noting episodes of anxiety and significant stress.  Noting some exertional dyspnea that was ongoing but worsens with anxiety.  Started to try to exercise once or twice a week.  Notably improved dyspnea symptoms.  Continue Plavix  send ongoing daily, Zetia  10 mg daily and Crestor  20 mg daily.  Low blood pressures therefore on midodrine  2.5 mg twice  daily.  No further lightheadedness or dizziness.  LDL was 49 on the current dose of statin plus Zetia .  Echo and Myoview  ordered for routine surveillance.  Subjective  Discussed the use of AI scribe software for clinical note transcription with the patient, who gave verbal consent to proceed.  History of Present Illness Heather Lucas is a 63 year old female who presents for a cardiology follow-up.  She has a history of coronary artery disease and underwent bypass surgery in 2020 while living in [place] .   Overall, she seems to be pretty well from a cardiac standpoint.  She does note occasional shortness of breath, which is not present when lying down or at rest and has not changed in character. No chest pain, heart racing, or palpitations. She is currently on medications including midodrine  for blood pressure, Plavix , simvastatin, and ezetimibe  for cholesterol management, and pantoprazole for GERD.  She experiences high anxiety levels, persistent since she stopped working in June of the previous year. This anxiety is exacerbated by stress and is associated with shortness of breath. She is currently on buspirone  for anxiety management, which helps but does not completely alleviate symptoms.  She developed tardive dyskinesia due to antipsychotic medications prescribed while living in [place] . Symptoms include shaking in her legs and arms, similar to Parkinson's, and issues with her mouth. She has discontinued the antipsychotics and is currently taking Cogentin  for the shakes, which have improved over time.    Objective  Medications -  Cogentin  0.5 mg twice daily; - Buspirone  - Midodrine , half a pill twice a day 5 mg twice daily - Plavix  75 mg daily; -rosuvastatin  20 mg- Zetia  (ezetimibe ) 10 mg daily; cod liver oil 1 capsule daily - Pantoprazole 40 mg daily - Zinc 30 mg daily - Melatonin 10 mg nightly  She lived in [place]  for two and a half years before returning and has been applying for  disability due to her inability to work and the impact of her medical conditions on her daily life. Significant life stressors include the death of a friend in 04-17-23, affecting her living situation. She has moved three times recently and is currently staying at her deceased friend's home. She tries to stay active by taking classes and attending therapy, although her tardive dyskinesia previously limited her ability to drive and write.  Studies Reviewed: Aaron Aas   EKG Interpretation Date/Time:  Wednesday Jul 01 2023 14:15:18 EDT Ventricular Rate:  67 PR Interval:  142 QRS Duration:  96 QT Interval:  406 QTC Calculation: 429 R Axis:   74  Text Interpretation: Normal sinus rhythm Incomplete right bundle branch block When compared with ECG of 06-Jan-2023 09:32, No significant change was found Confirmed by Randene Bustard ([id number]) on 07/01/2023 2:46:50 PM    ECHO 05/07/2023: Normal LVEF at 60-65%.  No RWMA.  Normal diastolic parameters.  Normal PAP.  Normal valves.  Normal RAP. Echo  07/2021:  EF 65-70%. No RWMA, beyond postop septal dyssynergy. Normal Valves.  Gated SPECT Myoview  03/09/2023: LOW RISK.  No ischemia or infarction.  Pertinent Labs: 07/16/2022: TC 112, TG 96, HDL 44, LDL 49 09/10/2022: TC 116, TG 88, HDL 46, LDL 52. 01/06/2023: BUN 14/Cr 0.72, K5.0, GLU 99  Risk Assessment/Calculations:         Physical Exam:   VS:  BP (!) 103/58   Pulse 67   Ht 5\' 1"  (1.549 m)   Wt 129 lb 6.4 oz (58.7 kg)   SpO2 97%   BMI 24.45 kg/m    Wt Readings from Last 3 Encounters:  07/01/23 129 lb 6.4 oz (58.7 kg)  04/02/23 121 lb 9.6 oz (55.2 kg)  03/09/23 110 lb (49.9 kg)    GEN: Healthy appearing.  Well nourished, well groomed in no acute distress; no obvious TD movements. NECK: No JVD; No carotid bruits CARDIAC: Normal S1, S2; RRR, no murmurs, rubs, gallops RESPIRATORY:  Clear to auscultation without rales, wheezing or rhonchi ; nonlabored, good air movement. ABDOMEN: Soft, non-tender,  non-distended EXTREMITIES:  No edema; No deformity     ASSESSMENT AND PLAN: .    Problem List Items Addressed This Visit       Cardiology Problems   Coronary artery disease involving native coronary artery of native heart without angina pectoris - Primary (Chronic)   Multivessel CAD noted on For the CABG in 2022: Myoview  ordered January 2025 showed normal cardiac function with no ischemia. No cardiac-related chest pain or dyspnea reported. - Continue Plavix  75 mg daily, tolerated better than aspirin  Okay to hold Plavix  5 to 7 days preop for surgeries or procedures. - Continue combination rosuvastatin  20 mg, and ezetimibe  10 mg daily for lipids - Has hypotension therefore no room for beta-blocker or ARB. - Monitor for new symptoms such as chest pain or significant dyspnea.      Relevant Orders   EKG 12-Lead (Completed)   Hyperlipidemia with target low density lipoprotein (LDL) cholesterol less than 55 mg/dL (Chronic)   Cholesterol levels within normal limits as  of last check in July 2022.  Due for re-evaluation. - Continue rosuvastatin  20 mg and ezetimibe  10 mg daily. - Check cholesterol levels at next PCP appointment.        Other   DOE (dyspnea on exertion) (Chronic)   Evaluated with Myoview  and echo that were both normal.  Suspect this is far more related to deconditioning.  Continue recommend exercise.      Hx of CABG x 4 (Chronic)   CABG was in February 2022-EF improved back to normal range.  Now with essentially normal Myoview  and Echo in setting of no symptoms, no further evaluation needed at this time.       Anxiety Disorder Chronic anxiety exacerbated by stressors, contributing to dyspnea. Managed with buspirone , providing partial relief. - Continue buspirone . - Encourage therapy and counseling.  Tardive Dyskinesia Improved symptoms with Cogentin  after discontinuation of antipsychotics. - Continue Cogentin .  GERD Managed with pantoprazole          Follow-Up: Return in about 1 year (around 06/30/2024) for Alternating annual follow-ups APP and MD.  Total time spent: 28 min spent with patient + 16 min spent charting = 44 min Time charting: 1 minute reviewing labs from epic and KPN; 5 minutes reviewing echo and Myoview  reports and images; 2 minutes reviewing APP notes and my last note.  8 minutes dictating and documenting = 16 minutes    Signed, Arleen Lacer, MD, MS Randene Bustard, M.D., M.S. Interventional Chartered certified accountant  Pager # [phone]

## 2023-07-01 NOTE — Patient Instructions (Signed)

## 2023-07-03 ENCOUNTER — Encounter: Payer: Self-pay | Admitting: Cardiology

## 2023-07-03 NOTE — Assessment & Plan Note (Signed)
 Cholesterol levels within normal limits as of last check in July 2022.  Due for re-evaluation. - Continue rosuvastatin  20 mg and ezetimibe  10 mg daily. - Check cholesterol levels at next PCP appointment.

## 2023-07-03 NOTE — Assessment & Plan Note (Signed)
 Evaluated with Myoview  and echo that were both normal.  Suspect this is far more related to deconditioning.  Continue recommend exercise.

## 2023-07-03 NOTE — Assessment & Plan Note (Signed)
 Multivessel CAD noted on For the CABG in 2022: Myoview  ordered January 2025 showed normal cardiac function with no ischemia. No cardiac-related chest pain or dyspnea reported. - Continue Plavix  75 mg daily, tolerated better than aspirin  Okay to hold Plavix  5 to 7 days preop for surgeries or procedures. - Continue combination rosuvastatin  20 mg, and ezetimibe  10 mg daily for lipids - Has hypotension therefore no room for beta-blocker or ARB. - Monitor for new symptoms such as chest pain or significant dyspnea.

## 2023-07-03 NOTE — Assessment & Plan Note (Signed)
 CABG was in February 2022-EF improved back to normal range.  Now with essentially normal Myoview  and Echo in setting of no symptoms, no further evaluation needed at this time.

## 2023-07-22 ENCOUNTER — Telehealth: Payer: Self-pay | Admitting: Adult Health

## 2023-07-22 ENCOUNTER — Telehealth: Payer: Self-pay | Admitting: Neurology

## 2023-07-22 NOTE — Telephone Encounter (Signed)
 Please return call to [phone] Dr. Calhoun Catalina to discuss disability claim for this pt. Called 11:16 am today.

## 2023-07-22 NOTE — Telephone Encounter (Signed)
 Dr Bard Boor w/ [employer] is asking for a call to discuss review disability claim for pt. No case or reference #provided.

## 2023-07-23 NOTE — Telephone Encounter (Addendum)
 Returned call. Lvm asking to RTC to schedule time to discuss disability claim with Regina Mozingo.

## 2023-07-27 ENCOUNTER — Ambulatory Visit (INDEPENDENT_AMBULATORY_CARE_PROVIDER_SITE_OTHER): Admitting: Mental Health

## 2023-07-27 DIAGNOSIS — F411 Generalized anxiety disorder: Secondary | ICD-10-CM

## 2023-07-27 DIAGNOSIS — F331 Major depressive disorder, recurrent, moderate: Secondary | ICD-10-CM | POA: Diagnosis not present

## 2023-07-27 NOTE — Progress Notes (Unsigned)
 [facility]   Name: Heather Lucas Date: 06/29/23 MRN: [id number] DOB: June 06, 1960 PCP: Jeannine Milroy., MD   Time spent:  50 minutes   Treatment:  Ind. therapy   Mental Status Exam:         Appearance:    Casual      Behavior:   Appropriate   Motor:   WNL   Speech/Language:    Clear and Coherent   Affect:   Full range    Mood:   anxious pleasant   Thought process:   Logical, linear, goal directed   Thought content:     WNL   Sensory/Perceptual disturbances:     none   Orientation:   x4   Attention:   Good   Concentration:   Good   Memory:   Intact   Fund of knowledge:    Consistent with age and development   Insight:     Good   Judgment:    Good   Impulse Control:   Good       Reported Symptoms:  anxiety daily (10/10), depression (3/10)   Risk Assessment: Danger to Self:  No Self-injurious Behavior: No Danger to Others: No Duty to Warn:no Physical Aggression / Violence:No  Access to Firearms a concern: No  Gang Involvement:No  Patient / guardian was educated about steps to take if suicide or homicide risk level increases between visits: yes While future psychiatric events cannot be accurately predicted, the patient does not currently require acute inpatient psychiatric care and does not currently meet [place]  involuntary commitment criteria.          Medications:       Current Outpatient Medications  Medication Sig Dispense Refill   aspirin 81 MG EC tablet Take 81 mg by mouth daily.       buPROPion  (WELLBUTRIN  XL) 300 MG 24 hr tablet Take 1 tablet (300 mg total) by mouth daily. 30 tablet 2   clonazePAM  (KLONOPIN ) 1 MG tablet TAKE ONE TABLET BY MOUTH TWICE DAILY 60 tablet 2   clopidogrel  (PLAVIX ) 75 MG tablet Take 75 mg by mouth daily.       ergocalciferol  (VITAMIN D2) 1.25 MG (50000 UT) capsule Take 50,000 Units by mouth daily.       ezetimibe  (ZETIA ) 10 MG tablet Take 1 tablet (10 mg total) by mouth daily. 90 tablet 3   midodrine   (PROAMATINE ) 5 MG tablet Take 5 mg  in the morning  and take 2.5 mg ( 1/2 tablet ) in the evening.       mirtazapine  (REMERON ) 15 MG tablet Take 1 tablet (15 mg total) by mouth at bedtime. 30 tablet 2   rosuvastatin  (CRESTOR ) 20 MG tablet Take 20 mg by mouth daily.   5   Sennosides 15 MG TABS Take 15 mg by mouth daily. Takes 8 tablets a night       valbenazine  (INGREZZA ) 40 MG capsule Take 1 capsule (40 mg total) by mouth daily. 30 capsule 5   valbenazine  (INGREZZA ) 40 MG capsule Take 1 capsule (40 mg total) by mouth daily. 30 capsule 0    No current facility-administered medications for this visit.        Subjective:  Patient presents on time for today's session.     Assessed progress where she shared how she continues to cope with various life stressors.  She stated she continues to await disability determination.  Ongoing financial stress was shared, she currently has a place to stay  but is also applying for section 8 housing in the hopes to secure an apartment in the coming months.  She reports ongoing anxiety, rumination and discussed coping skills, thought stopping.  She reports her anxiety has increased over the last few weeks due to various uncertainties.  Continues to report some struggle with short-term memory challenges.  Gave her homework assignment related to thought stopping and reframing, radical acceptance.  She denied any concerns related to medication currently and agreed to contact our office between visits as needed.   Interventions: CBT, supportive therapy, motivational interviewing   Diagnoses:  Panic disorder Generalized anxiety disorder Major depressive disorder, recurrent, moderate   Plan: Patient is to use CBT, mindfulness and coping skills to help manage decrease symptoms associated with their diagnosis.     Long-term goals:   Maintain symptom reduction: The patient will report sustained reduction in symptoms of anxiety using both CBT and mindfulness  interventions for 3 consecutive months progressively.  Improve emotional regulation: The patient will learn and apply CBT and mindfulness-based strategies to regulate emotions and report an improvement in emotional regulation for at least 3 consecutive months progressively.   Short-term goal:  The patient will learn and apply CBT and mindfulness-based coping skills for managing anxiety and practice using it between sessions.       2.   Increase awareness of automatic thoughts: The patient will learn to identify automatic thoughts and increase awareness of their impact on emotions and behaviors through both CBT and mindfulness-based interventions.       3.    Identify coping related to managing her panic attacks and decrease frequency of the attacks from daily to 0-1 times per week         Avram Lenis, [facility]

## 2023-07-31 ENCOUNTER — Ambulatory Visit: Admitting: Adult Health

## 2023-07-31 ENCOUNTER — Encounter: Payer: Self-pay | Admitting: Adult Health

## 2023-07-31 DIAGNOSIS — F410 Panic disorder [episodic paroxysmal anxiety] without agoraphobia: Secondary | ICD-10-CM

## 2023-07-31 DIAGNOSIS — F411 Generalized anxiety disorder: Secondary | ICD-10-CM

## 2023-07-31 DIAGNOSIS — F331 Major depressive disorder, recurrent, moderate: Secondary | ICD-10-CM | POA: Diagnosis not present

## 2023-07-31 DIAGNOSIS — G2401 Drug induced subacute dyskinesia: Secondary | ICD-10-CM

## 2023-07-31 DIAGNOSIS — G4700 Insomnia, unspecified: Secondary | ICD-10-CM | POA: Diagnosis not present

## 2023-07-31 MED ORDER — BENZTROPINE MESYLATE 0.5 MG PO TABS: 0.5000 mg | ORAL_TABLET | Freq: Two times a day (BID) | ORAL | 2 refills | Status: DC

## 2023-07-31 MED ORDER — BUSPIRONE HCL 5 MG PO TABS: 5.0000 mg | ORAL_TABLET | Freq: Two times a day (BID) | ORAL | 2 refills | Status: DC

## 2023-07-31 MED ORDER — MIRTAZAPINE 7.5 MG PO TABS: 7.5000 mg | ORAL_TABLET | Freq: Every day | ORAL | 2 refills | Status: DC

## 2023-07-31 NOTE — Progress Notes (Signed)
 Heather Lucas [id number] 1960-04-21 63 y.o.  Subjective:   Patient ID:  Heather Lucas is a 63 y.o. (DOB 1960/05/12) female.  Chief Complaint: No chief complaint on file.   HPI Heather Lucas presents to the office today for follow-up of MDD, GAD, panic attacks and TD.  Describes mood today as ok. Pleasant. Reports tearfulness. Mood symptoms - reports depression. Reports fair interest and motivation. Reports she is having to push herself to do things. Reports anxiety - every day. Denies irritability. Reports panic attacks. Reports worry, rumination and over thinking - concerned about the future. Denies obsessive thoughts. Reports mood as variable. Stating I feel like I'm about the same. Feels like current medications are helpful. Taking medications as prescribed.  Energy levels fair. Active, does not have a regular exercise routine. Enjoys some usual interests and activities. Single. Lives alone. Attends church twice a week. Brother in [place] . Friends local. Appetite adequate. Weight gain - 122 to 130 pounds - 61.  Sleeps well at night. Averages 8 to 10 hours.   Reports difficulties with focus and concentration. Does not feel like she is able to return to work at this time. Has been approved for early retirement benefits, but is awaiting SSDI outcome. Reports trying to keep home in order - light housekeeping. Completing minimal tasks at home.  Denies SI and HI. Denies AH or AH. Denies self harm. Denies substance use.  Previous medication trials:  Zoloft, Prozac, Wellbutrin , Lexapro, Trintellix , Hydroxyzine .    AIMS    Flowsheet Row Admission (Discharged) from 09/21/2022 in [facility] BEHAVIORAL MEDICINE ED from 09/20/2022 in [facility] Emergency Department at [facility] Office Visit from 12/13/2021 in [facility]  AIMS Total Score 14 14 28    AUDIT    Flowsheet Row Admission (Discharged) from 09/21/2022 in [facility] BEHAVIORAL  MEDICINE  Alcohol  Use Disorder Identification Test Final Score (AUDIT) 0   Mini-Mental    Flowsheet Row Office Visit from 07/30/2022 in [facility]  Total Score (max 30 points ) 30   Flowsheet Row Admission (Discharged) from 09/21/2022 in [facility] BEHAVIORAL MEDICINE ED from 09/20/2022 in [facility] Emergency Department at [facility] ED from 09/18/2022 in [facility] Emergency Department at [facility]  C-SSRS RISK CATEGORY Moderate Risk Moderate Risk No Risk     Review of Systems:  Review of Systems  Musculoskeletal:  Negative for gait problem.  Neurological:  Negative for tremors.  Psychiatric/Behavioral:         Please refer to HPI    Medications: I have reviewed the patient's current medications.  Current Outpatient Medications  Medication Sig Dispense Refill   benztropine  (COGENTIN ) 0.5 MG tablet Take 1 tablet (0.5 mg total) by mouth 2 (two) times daily. 60 tablet 2   busPIRone  (BUSPAR ) 5 MG tablet Take 1 tablet (5 mg total) by mouth 2 (two) times daily. 60 tablet 2   cholecalciferol (VITAMIN D3) 25 MCG (1000 UNIT) tablet Take 1,000 Units by mouth daily.     clopidogrel  (PLAVIX ) 75 MG tablet Take 1 tablet (75 mg total) by mouth daily. 30 tablet 0   COD LIVER OIL PO Take 1 capsule by mouth daily.     ezetimibe  (ZETIA ) 10 MG tablet Take 1 tablet (10 mg total) by mouth daily. 30 tablet 0   Melatonin 10 MG CAPS Take 1 tablet by mouth daily.     midodrine  (PROAMATINE ) 2.5 MG tablet Take 2.5 mg by mouth 2 (two) times  daily with a meal.     mirtazapine  (REMERON ) 7.5 MG tablet Take 1 tablet (7.5 mg total) by mouth at bedtime. 30 tablet 2   Multiple Vitamin (MULTI VITAMIN) TABS Take 1 tablet by mouth daily.     pantoprazole (PROTONIX) 40 MG tablet Take 40 mg by mouth every morning.     rosuvastatin  (CRESTOR ) 20 MG tablet Take 1 tablet (20 mg total) by mouth daily. 30 tablet 0   Zinc 30 MG TABS Take 1 tablet by mouth daily.     No  current facility-administered medications for this visit.    Medication Side Effects: None  Allergies: No Known Allergies  Past Medical History:  Diagnosis Date   Anxiety    Borderline hyperlipidemia    as of 10/2016: TC 134, TG 90, HDL 51, LDL 95 (PCP recently increase rosuvastatin  dose from 10 to 20 mg)   Borderline hypertension    Coronary artery disease involving native coronary artery of native heart without angina pectoris 03/01/2020   [facility] [place]  [facility]:; Dr. Monika Annas:  multivessel CAD => 50% pLAD & 60% mLAD, 70% pLCx & 90% mLCx,  70% mid RCA => CABG x 4   Coronary artery disease with history of myocardial infarction without history of CABG    Depression    Family history of premature coronary artery disease 08/26/2017   Hx of CABG x 4 03/19/2020   [facility], [place], [place]  -> CABG x4 (LIMA-LAD, SVG-OM1, SVG-OM2, SVG-dRCA   Hyperlipidemia due to dietary fat intake 08/26/2017    Past Medical History, Surgical history, Social history, and Family history were reviewed and updated as appropriate.   Please see review of systems for further details on the patient's review from today.   Objective:   Physical Exam:  There were no vitals taken for this visit.  Physical Exam  Psychiatric:        Mood and Affect: Mood is anxious and depressed.     Lab Review:     Component Value Date/Time   NA 142 01/06/2023 1029   K 5.0 01/06/2023 1029   CL 105 01/06/2023 1029   CO2 24 01/06/2023 1029   GLUCOSE 99 01/06/2023 1029   GLUCOSE 99 09/20/2022 1545   BUN 14 01/06/2023 1029   CREATININE 0.72 01/06/2023 1029   CALCIUM  9.6 01/06/2023 1029   PROT 7.7 09/20/2022 1545   PROT 6.0 03/03/2022 0833   ALBUMIN 4.6 09/20/2022 1545   ALBUMIN 4.1 03/03/2022 0833   AST 30 09/20/2022 1545   ALT 21 09/20/2022 1545   ALKPHOS 69 09/20/2022 1545   BILITOT 0.7 09/20/2022 1545   BILITOT 0.5 03/03/2022 0833   GFRNONAA >60 09/20/2022  1545   GFRAA 95 10/16/2017 1412       Component Value Date/Time   WBC 6.2 01/06/2023 1029   WBC 11.2 (H) 09/20/2022 1545   RBC 4.49 01/06/2023 1029   RBC 4.41 09/20/2022 1545   HGB 13.9 01/06/2023 1029   HCT 43.7 01/06/2023 1029   PLT 266 01/06/2023 1029   MCV 97 01/06/2023 1029   MCH 31.0 01/06/2023 1029   MCH 30.8 09/20/2022 1545   MCHC 31.8 01/06/2023 1029   MCHC 33.3 09/20/2022 1545   RDW 12.2 01/06/2023 1029   LYMPHSABS 0.9 09/18/2022 1902   MONOABS 0.7 09/18/2022 1902   EOSABS 0.0 09/18/2022 1902   BASOSABS 0.0 09/18/2022 1902    No results found for: POCLITH, LITHIUM   No results found for: PHENYTOIN, PHENOBARB,  VALPROATE, CBMZ   .res Assessment: Plan:    Plan:  PDMP reviewed  Buspar  5mg  BID Remeron  7.5 MG tablet - depression/anxiety Cogentin  0.5mg  BID - movement disorder.     Discussed medications and treatment plans with patient. Patient totally disabled and unable to work at this time with her anxiety, panic attacks and cognitive deficits.    RTC 4/6 weeks  25 minutes spent dedicated to the care of this patient on the date of this encounter to include pre-visit review of records, ordering of medication, post visit documentation, and face-to-face time with the patient discussing MDD, GAD, panic attacks and TD. Discussed continuing current medication regimen.  Patient advised to contact office with any questions, adverse effects, or acute worsening in signs and symptoms.   Discussed potential benefits, risk, and side effects of benzodiazepines to include potential risk of tolerance and dependence, as well as possible drowsiness. Advised patient not to drive if experiencing drowsiness and to take lowest possible effective dose to minimize risk of dependence and tolerance.   Diagnoses and all orders for this visit:  Major depressive disorder, recurrent episode, moderate (HCC) -     benztropine  (COGENTIN ) 0.5 MG tablet; Take 1 tablet (0.5 mg  total) by mouth 2 (two) times daily. -     busPIRone  (BUSPAR ) 5 MG tablet; Take 1 tablet (5 mg total) by mouth 2 (two) times daily. -     mirtazapine  (REMERON ) 7.5 MG tablet; Take 1 tablet (7.5 mg total) by mouth at bedtime.  Panic attacks  Generalized anxiety disorder  Insomnia, unspecified type  Tardive dyskinesia     Please see After Visit Summary for patient specific instructions.  Future Appointments  Date Time Provider Department Center  08/27/2023  2:00 PM Avram Lenis, [facility] CP-CP None    No orders of the defined types were placed in this encounter.   -------------------------------

## 2023-08-27 ENCOUNTER — Ambulatory Visit: Admitting: Mental Health

## 2023-09-10 ENCOUNTER — Ambulatory Visit: Admitting: Mental Health

## 2023-09-24 ENCOUNTER — Ambulatory Visit (INDEPENDENT_AMBULATORY_CARE_PROVIDER_SITE_OTHER): Admitting: Mental Health

## 2023-09-24 DIAGNOSIS — F411 Generalized anxiety disorder: Secondary | ICD-10-CM | POA: Diagnosis not present

## 2023-09-24 DIAGNOSIS — F410 Panic disorder [episodic paroxysmal anxiety] without agoraphobia: Secondary | ICD-10-CM

## 2023-09-24 NOTE — Progress Notes (Signed)
 [facility]   Name: Heather Lucas Date:  8/15//25 MRN: [id number] DOB: 12/28/1960 PCP: Loreli Elsie [name], MD   Time spent:  50 minutes   Treatment:  Ind. therapy   Mental Status Exam:         Appearance:    Casual      Behavior:   Appropriate   Motor:   WNL   Speech/Language:    Clear and Coherent   Affect:   Full range    Mood:   anxious pleasant   Thought process:   Logical, linear, goal directed   Thought content:     WNL   Sensory/Perceptual disturbances:     none   Orientation:   x4   Attention:   Good   Concentration:   Good   Memory:   Intact   Fund of knowledge:    Consistent with age and development   Insight:     Good   Judgment:    Good   Impulse Control:   Good       Reported Symptoms:  anxiety daily (10/10), depression (3/10)   Risk Assessment: Danger to Self:  No Self-injurious Behavior: No Danger to Others: No Duty to Warn:no Physical Aggression / Violence:No  Access to Firearms a concern: No  Gang Involvement:No  Patient / guardian was educated about steps to take if suicide or homicide risk level increases between visits: yes While future psychiatric events cannot be accurately predicted, the patient does not currently require acute inpatient psychiatric care and does not currently meet [place]  involuntary commitment criteria.          Medications:       Current Outpatient Medications  Medication Sig Dispense Refill   aspirin 81 MG EC tablet Take 81 mg by mouth daily.       buPROPion  (WELLBUTRIN  XL) 300 MG 24 hr tablet Take 1 tablet (300 mg total) by mouth daily. 30 tablet 2   clonazePAM  (KLONOPIN ) 1 MG tablet TAKE ONE TABLET BY MOUTH TWICE DAILY 60 tablet 2   clopidogrel  (PLAVIX ) 75 MG tablet Take 75 mg by mouth daily.       ergocalciferol  (VITAMIN D2) 1.25 MG (50000 UT) capsule Take 50,000 Units by mouth daily.       ezetimibe  (ZETIA ) 10 MG tablet Take 1 tablet (10 mg total) by mouth daily. 90 tablet 3    midodrine  (PROAMATINE ) 5 MG tablet Take 5 mg  in the morning  and take 2.5 mg ( 1/2 tablet ) in the evening.       mirtazapine  (REMERON ) 15 MG tablet Take 1 tablet (15 mg total) by mouth at bedtime. 30 tablet 2   rosuvastatin  (CRESTOR ) 20 MG tablet Take 20 mg by mouth daily.   5   Sennosides 15 MG TABS Take 15 mg by mouth daily. Takes 8 tablets a night       valbenazine  (INGREZZA ) 40 MG capsule Take 1 capsule (40 mg total) by mouth daily. 30 capsule 5   valbenazine  (INGREZZA ) 40 MG capsule Take 1 capsule (40 mg total) by mouth daily. 30 capsule 0    No current facility-administered medications for this visit.        Subjective:  Patient presents on time for today's session.  Assessed progress per patient shared how she has had recent changes.  She went on to share how she now is in her own apartment.  She stated that it is based on her income which has been  very helpful due to her ongoing financial stress.  She stated that she is very thankful for having a support system and has helped her try to manage and reduce her monthly expenditures.  She continues to cope with anxiety, worries about her future although she further shared feelings of gratitude for the support system she has and the timing of her being able to obtain her own residence.  Explored coping specific to managing her anxiety.  Provide some psychoeducation related to the benefits of utilizing diaphragmatic breathing and daily.  She identified how she has some other ways she is trying to cope with rumination that can occur, sometimes eating can help be a distraction and allow anxious thoughts to dissipate.  Provide support and encouragement for her to follow through with coping discussed.  She plans to also try to make friends in her new apartment complex over the next few weeks.  Interventions: CBT, supportive therapy, motivational interviewing   Diagnoses:  Panic disorder Generalized anxiety disorder Major depressive disorder,  recurrent, moderate   Plan: Patient is to use CBT, mindfulness and coping skills to help manage decrease symptoms associated with their diagnosis.     Long-term goals:   Maintain symptom reduction: The patient will report sustained reduction in symptoms of anxiety using both CBT and mindfulness interventions for 3 consecutive months progressively.  Improve emotional regulation: The patient will learn and apply CBT and mindfulness-based strategies to regulate emotions and report an improvement in emotional regulation for at least 3 consecutive months progressively.   Short-term goal:  The patient will learn and apply CBT and mindfulness-based coping skills for managing anxiety and practice using it between sessions.       2.   Increase awareness of automatic thoughts: The patient will learn to identify automatic thoughts and increase awareness of their impact on emotions and behaviors through both CBT and mindfulness-based interventions.       3.    Identify coping related to managing her panic attacks and decrease frequency of the attacks from daily to 0-1 times per week         Lonni Fischer, [facility]

## 2023-09-30 ENCOUNTER — Ambulatory Visit: Admitting: Adult Health

## 2023-09-30 ENCOUNTER — Encounter: Payer: Self-pay | Admitting: Adult Health

## 2023-09-30 DIAGNOSIS — F411 Generalized anxiety disorder: Secondary | ICD-10-CM | POA: Diagnosis not present

## 2023-09-30 DIAGNOSIS — F410 Panic disorder [episodic paroxysmal anxiety] without agoraphobia: Secondary | ICD-10-CM | POA: Diagnosis not present

## 2023-09-30 DIAGNOSIS — G2401 Drug induced subacute dyskinesia: Secondary | ICD-10-CM | POA: Diagnosis not present

## 2023-09-30 DIAGNOSIS — F331 Major depressive disorder, recurrent, moderate: Secondary | ICD-10-CM | POA: Diagnosis not present

## 2023-09-30 MED ORDER — BUSPIRONE HCL 5 MG PO TABS
5.0000 mg | ORAL_TABLET | Freq: Two times a day (BID) | ORAL | 2 refills | Status: DC
Start: 2023-09-30 — End: 2023-11-25

## 2023-09-30 MED ORDER — MIRTAZAPINE 7.5 MG PO TABS: 7.5000 mg | ORAL_TABLET | Freq: Every day | ORAL | 2 refills | Status: DC

## 2023-09-30 MED ORDER — BENZTROPINE MESYLATE 0.5 MG PO TABS: 0.5000 mg | ORAL_TABLET | Freq: Two times a day (BID) | ORAL | 2 refills | Status: DC

## 2023-09-30 NOTE — Progress Notes (Signed)
 [name] [id number] Jul 29, 1960 63 y.o.  Subjective:   Patient ID:  Heather Lucas is a 63 y.o. (DOB 05-May-1960) female.  Chief Complaint: No chief complaint on file.   HPI Heather Lucas presents to the office today for follow-up of MDD, GAD, panic attacks and TD.  Describes mood today as ok. Pleasant. Reports tearfulness. Mood symptoms - reports depression - sometimes. Reports varying interest and motivation. Reports she is trying to do more at home. Reports anxiety - a little bit every day. Denies irritability. Reports decreased panic attacks. Reports some worry, rumination and over thinking. Denies obsessive thoughts. Reports mood is pretty stable. Stating I feel like I'm making some progress. Feels like current medications are helpful. Taking medications as prescribed.  Energy levels about the same. Active, does not have a regular exercise routine. Enjoys some usual interests and activities. Single. Lives alone. Attends church twice a week. Brother in [place] . Friends local. Appetite adequate. Weight stable - 130 pounds - 61.  Sleeps well at night. Averages 8 to 10 hours.   Reports difficulties with focus and concentration - about the same.  Does not feel like she is able to return to work at this time due to mental and physical health issues. Reports trying to keep home in order - light housekeeping. Completing minimal tasks at home. Has been approved for early retirement benefits. She is working with an Pensions consultant for [employer] benefits. Denies SI and HI. Denies AH or AH. Denies self harm. Denies substance use.  Previous medication trials:  Zoloft, Prozac, Wellbutrin , Lexapro, Trintellix , Hydroxyzine .   AIMS    Flowsheet Row Admission (Discharged) from 09/21/2022 in [facility] [place] [facility] BEHAVIORAL MEDICINE ED from 09/20/2022 in [facility] Emergency Department at [facility] Office Visit from 12/13/2021 in [facility]  AIMS Total Score 14 14  28    AUDIT    Flowsheet Row Admission (Discharged) from 09/21/2022 in [facility] MEDICINE  Alcohol  Use Disorder Identification Test Final Score (AUDIT) 0   Mini-Mental    Flowsheet Row Office Visit from 07/30/2022 in [facility]  Total Score (max 30 points ) 30   Flowsheet Row Admission (Discharged) from 09/21/2022 in [facility] BEHAVIORAL MEDICINE ED from 09/20/2022 in [facility] Emergency Department at [facility] ED from 09/18/2022 in [facility] Emergency Department at [facility]  C-SSRS RISK CATEGORY Moderate Risk Moderate Risk No Risk     Review of Systems:  Review of Systems  Musculoskeletal:  Negative for gait problem.  Neurological:  Negative for tremors.  Psychiatric/Behavioral:         Please refer to HPI    Medications: I have reviewed the patient's current medications.  Current Outpatient Medications  Medication Sig Dispense Refill   benztropine  (COGENTIN ) 0.5 MG tablet Take 1 tablet (0.5 mg total) by mouth 2 (two) times daily. 60 tablet 2   busPIRone  (BUSPAR ) 5 MG tablet Take 1 tablet (5 mg total) by mouth 2 (two) times daily. 60 tablet 2   cholecalciferol (VITAMIN D3) 25 MCG (1000 UNIT) tablet Take 1,000 Units by mouth daily.     clopidogrel  (PLAVIX ) 75 MG tablet Take 1 tablet (75 mg total) by mouth daily. 30 tablet 0   COD LIVER OIL PO Take 1 capsule by mouth daily.     ezetimibe  (ZETIA ) 10 MG tablet Take 1 tablet (10 mg total) by mouth daily. 30 tablet 0   Melatonin 10 MG CAPS Take 1 tablet by mouth daily.  midodrine  (PROAMATINE ) 2.5 MG tablet Take 2.5 mg by mouth 2 (two) times daily with a meal.     mirtazapine  (REMERON ) 7.5 MG tablet Take 1 tablet (7.5 mg total) by mouth at bedtime. 30 tablet 2   Multiple Vitamin (MULTI VITAMIN) TABS Take 1 tablet by mouth daily.     pantoprazole (PROTONIX) 40 MG tablet Take 40 mg by mouth every morning.     rosuvastatin  (CRESTOR ) 20 MG tablet Take 1 tablet (20  mg total) by mouth daily. 30 tablet 0   Zinc 30 MG TABS Take 1 tablet by mouth daily.     No current facility-administered medications for this visit.    Medication Side Effects: None  Allergies: No Known Allergies  Past Medical History:  Diagnosis Date   Anxiety    Borderline hyperlipidemia    as of 10/2016: TC 134, TG 90, HDL 51, LDL 95 (PCP recently increase rosuvastatin  dose from 10 to 20 mg)   Borderline hypertension    Coronary artery disease involving native coronary artery of native heart without angina pectoris 03/01/2020   [facility] [place]  [facility]:; Dr. Clerance:  multivessel CAD => 50% pLAD & 60% mLAD, 70% pLCx & 90% mLCx,  70% mid RCA => CABG x 4   Coronary artery disease with history of myocardial infarction without history of CABG    Depression    Family history of premature coronary artery disease 08/26/2017   Hx of CABG x 4 03/19/2020   [facility] [place], [place]  -> CABG x4 (LIMA-LAD, SVG-OM1, SVG-OM2, SVG-dRCA   Hyperlipidemia due to dietary fat intake 08/26/2017    Past Medical History, Surgical history, Social history, and Family history were reviewed and updated as appropriate.   Please see review of systems for further details on the patient's review from today.   Objective:   Physical Exam:  There were no vitals taken for this visit.  Physical Exam Constitutional:      General: She is not in acute distress. Musculoskeletal:        General: No deformity.  Neurological:     Mental Status: She is alert and oriented to person, place, and time.     Coordination: Coordination normal.  Psychiatric:        Attention and Perception: Attention and perception normal. She does not perceive auditory or visual hallucinations.        Mood and Affect: Mood normal. Mood is not anxious or depressed. Affect is not labile, blunt, angry or inappropriate.        Speech: Speech normal.        Behavior: Behavior normal.         Thought Content: Thought content normal. Thought content is not paranoid or delusional. Thought content does not include homicidal or suicidal ideation. Thought content does not include homicidal or suicidal plan.        Cognition and Memory: Cognition and memory normal.        Judgment: Judgment normal.     Comments: Insight intact     Lab Review:     Component Value Date/Time   NA 142 01/06/2023 1029   K 5.0 01/06/2023 1029   CL 105 01/06/2023 1029   CO2 24 01/06/2023 1029   GLUCOSE 99 01/06/2023 1029   GLUCOSE 99 09/20/2022 1545   BUN 14 01/06/2023 1029   CREATININE 0.72 01/06/2023 1029   CALCIUM  9.6 01/06/2023 1029   PROT 7.7 09/20/2022 1545   PROT 6.0 03/03/2022 9166  ALBUMIN 4.6 09/20/2022 1545   ALBUMIN 4.1 03/03/2022 0833   AST 30 09/20/2022 1545   ALT 21 09/20/2022 1545   ALKPHOS 69 09/20/2022 1545   BILITOT 0.7 09/20/2022 1545   BILITOT 0.5 03/03/2022 0833   GFRNONAA >60 09/20/2022 1545   GFRAA 95 10/16/2017 1412       Component Value Date/Time   WBC 6.2 01/06/2023 1029   WBC 11.2 (H) 09/20/2022 1545   RBC 4.49 01/06/2023 1029   RBC 4.41 09/20/2022 1545   HGB 13.9 01/06/2023 1029   HCT 43.7 01/06/2023 1029   PLT 266 01/06/2023 1029   MCV 97 01/06/2023 1029   MCH 31.0 01/06/2023 1029   MCH 30.8 09/20/2022 1545   MCHC 31.8 01/06/2023 1029   MCHC 33.3 09/20/2022 1545   RDW 12.2 01/06/2023 1029   LYMPHSABS 0.9 09/18/2022 1902   MONOABS 0.7 09/18/2022 1902   EOSABS 0.0 09/18/2022 1902   BASOSABS 0.0 09/18/2022 1902    No results found for: POCLITH, LITHIUM   No results found for: PHENYTOIN, PHENOBARB, VALPROATE, CBMZ   .res Assessment: Plan:    Plan:  PDMP reviewed  Buspar  5mg  BID Remeron  7.5 MG tablet at bedtime - depression/anxiety Cogentin  0.5mg  BID - movement disorder.     Discussed medications and treatment plans with patient. Patient totally disabled and unable to work at this time with her anxiety, panic attacks and  cognitive deficits.    RTC 4/6 weeks  25 minutes spent dedicated to the care of this patient on the date of this encounter to include pre-visit review of records, ordering of medication, post visit documentation, and face-to-face time with the patient discussing MDD, GAD, panic attacks and TD. Discussed continuing current medication regimen, but discussed possibly increasing Cogentin  for TD symptoms - does not want to increase dose today.  Patient advised to contact office with any questions, adverse effects, or acute worsening in signs and symptoms.   Discussed potential benefits, risk, and side effects of benzodiazepines to include potential risk of tolerance and dependence, as well as possible drowsiness. Advised patient not to drive if experiencing drowsiness and to take lowest possible effective dose to minimize risk of dependence and tolerance.   There are no diagnoses linked to this encounter.   Please see After Visit Summary for patient specific instructions.  Future Appointments  Date Time Provider Department Center  11/02/2023  2:00 PM Bernardo Bruckner, [facility] None  02/29/2024  2:30 PM Sandridge, Erminio POUR, PA-C [facility] None    No orders of the defined types were placed in this encounter.   -------------------------------

## 2023-10-13 ENCOUNTER — Other Ambulatory Visit: Payer: Self-pay

## 2023-11-02 ENCOUNTER — Ambulatory Visit (INDEPENDENT_AMBULATORY_CARE_PROVIDER_SITE_OTHER): Admitting: Mental Health

## 2023-11-02 DIAGNOSIS — F410 Panic disorder [episodic paroxysmal anxiety] without agoraphobia: Secondary | ICD-10-CM | POA: Diagnosis not present

## 2023-11-02 DIAGNOSIS — F411 Generalized anxiety disorder: Secondary | ICD-10-CM

## 2023-11-02 NOTE — Progress Notes (Signed)
 [facility]   Name: Heather Lucas Date:  9/22//25 MRN: [id number] DOB: 01/09/1961 PCP: Loreli Elsie [name], MD   Time spent:  51 minutes   Treatment:  Ind. therapy   Mental Status Exam:         Appearance:    Casual      Behavior:   Appropriate   Motor:   WNL   Speech/Language:    Clear and Coherent   Affect:   Full range    Mood:   anxious pleasant   Thought process:   Logical, linear, goal directed   Thought content:     WNL   Sensory/Perceptual disturbances:     none   Orientation:   x4   Attention:   Good   Concentration:   Good   Memory:   Intact   Fund of knowledge:    Consistent with age and development   Insight:     Good   Judgment:    Good   Impulse Control:   Good       Reported Symptoms:  anxiety daily (10/10), depression (3/10)   Risk Assessment: Danger to Self:  No Self-injurious Behavior: No Danger to Others: No Duty to Warn:no Physical Aggression / Violence:No  Access to Firearms a concern: No  Gang Involvement:No  Patient / guardian was educated about steps to take if suicide or homicide risk level increases between visits: yes While future psychiatric events cannot be accurately predicted, the patient does not currently require acute inpatient psychiatric care and does not currently meet   involuntary commitment criteria.          Medications:       Current Outpatient Medications  Medication Sig Dispense Refill   aspirin 81 MG EC tablet Take 81 mg by mouth daily.       buPROPion  (WELLBUTRIN  XL) 300 MG 24 hr tablet Take 1 tablet (300 mg total) by mouth daily. 30 tablet 2   clonazePAM  (KLONOPIN ) 1 MG tablet TAKE ONE TABLET BY MOUTH TWICE DAILY 60 tablet 2   clopidogrel  (PLAVIX ) 75 MG tablet Take 75 mg by mouth daily.       ergocalciferol  (VITAMIN D2) 1.25 MG (50000 UT) capsule Take 50,000 Units by mouth daily.       ezetimibe  (ZETIA ) 10 MG tablet Take 1 tablet (10 mg total) by mouth daily. 90 tablet 3    midodrine  (PROAMATINE ) 5 MG tablet Take 5 mg  in the morning  and take 2.5 mg ( 1/2 tablet ) in the evening.       mirtazapine  (REMERON ) 15 MG tablet Take 1 tablet (15 mg total) by mouth at bedtime. 30 tablet 2   rosuvastatin  (CRESTOR ) 20 MG tablet Take 20 mg by mouth daily.   5   Sennosides 15 MG TABS Take 15 mg by mouth daily. Takes 8 tablets a night       valbenazine  (INGREZZA ) 40 MG capsule Take 1 capsule (40 mg total) by mouth daily. 30 capsule 5   valbenazine  (INGREZZA ) 40 MG capsule Take 1 capsule (40 mg total) by mouth daily. 30 capsule 0    No current facility-administered medications for this visit.        Subjective:  Patient presents on time for today's session. Assessed progress where she continues to settle into her new apartment. Financially it has been helpful to make the move which is reduced some financial stress. She continues to report anxiety most days, reviewed coping continuing to explore outlets  for stress management. Which is helpful, tries to remind herself of her good cardiologist report a few months ago when she worries about her health. Experiences shortness of breath times; reviewed how this physical symptom came be a catalyst for panic attack cycle. Facilitated her framing thoughts toward recognizing her resilience and problem solving over the last several months.  Interventions: CBT, supportive therapy, motivational interviewing   Diagnoses:  Panic disorder Generalized anxiety disorder Major depressive disorder, recurrent, moderate   Plan: Patient is to use CBT, mindfulness and coping skills to help manage decrease symptoms associated with their diagnosis.     Long-term goals:   Maintain symptom reduction: The patient will report sustained reduction in symptoms of anxiety using both CBT and mindfulness interventions for 3 consecutive months progressively.  Improve emotional regulation: The patient will learn and apply CBT and mindfulness-based strategies to  regulate emotions and report an improvement in emotional regulation for at least 3 consecutive months progressively.   Short-term goal:  The patient will learn and apply CBT and mindfulness-based coping skills for managing anxiety and practice using it between sessions.       2.   Increase awareness of automatic thoughts: The patient will learn to identify automatic thoughts and increase awareness of their impact on emotions and behaviors through both CBT and mindfulness-based interventions.       3.    Identify coping related to managing her panic attacks and decrease frequency of the attacks from daily to 0-1 times per week         Lonni Fischer, [facility]

## 2023-11-25 ENCOUNTER — Encounter: Payer: Self-pay | Admitting: Adult Health

## 2023-11-25 ENCOUNTER — Ambulatory Visit (INDEPENDENT_AMBULATORY_CARE_PROVIDER_SITE_OTHER): Admitting: Adult Health

## 2023-11-25 DIAGNOSIS — G2401 Drug induced subacute dyskinesia: Secondary | ICD-10-CM

## 2023-11-25 DIAGNOSIS — F331 Major depressive disorder, recurrent, moderate: Secondary | ICD-10-CM | POA: Diagnosis not present

## 2023-11-25 DIAGNOSIS — F410 Panic disorder [episodic paroxysmal anxiety] without agoraphobia: Secondary | ICD-10-CM | POA: Diagnosis not present

## 2023-11-25 DIAGNOSIS — F411 Generalized anxiety disorder: Secondary | ICD-10-CM

## 2023-11-25 MED ORDER — BENZTROPINE MESYLATE 0.5 MG PO TABS: 0.5000 mg | ORAL_TABLET | Freq: Two times a day (BID) | ORAL | 2 refills | Status: AC

## 2023-11-25 MED ORDER — BUSPIRONE HCL 5 MG PO TABS: 5.0000 mg | ORAL_TABLET | Freq: Two times a day (BID) | ORAL | 2 refills | Status: DC

## 2023-11-25 MED ORDER — MIRTAZAPINE 7.5 MG PO TABS: 7.5000 mg | ORAL_TABLET | Freq: Every day | ORAL | 2 refills | Status: DC

## 2023-11-25 NOTE — Progress Notes (Signed)
 Heather Lucas [id number] 1960/12/01 63 y.o.  Subjective:   Patient ID:  Heather Lucas is a 63 y.o. (DOB [birth year]) female.  Chief Complaint: No chief complaint on file.   HPI Heather Lucas presents to the office today for follow-up of MDD, GAD, panic attacks and TD.  Describes mood today as ok. Pleasant. Reports tearfulness. Mood symptoms - reports depression - sometimes. Reports lower interest and motivation that's not good. Reports she is completing some tasks - things that need to be done Reports anxiety - every day. Denies irritability. Reports some panic attacks. Reports some worry, rumination and over thinking. Denies obsessive thoughts. Reports mood is variable - having some ups and downs. Stating I feel like I'm doing fair. Feels like current medications are helpful. Reports she is still experiencing movement issues in both of her legs - more so when lying down. Taking medications as prescribed.  Energy levels fair. Active, working on a regular exercise routine. Enjoys some usual interests and activities. Single. Lives alone. Attends church twice a week. Friends local. Brother in [place] . Appetite adequate. Weight gain - 130 to 133 pounds - 61.  Sleeps well at night. Averages 9 hours.   Reports difficulties with focus and concentration - it's still a challenge.  Does not feel like she is able to return to a work setting with the amount of anxiety she is experiencing. She is also having issues with memory - reading a book and not remembering what I read. Completing minimal tasks at home. Has been approved for early retirement benefits. She is working with an Pensions consultant for [employer] benefits.  Denies SI and HI. Denies AH or AH. Denies self harm. Denies substance use.  Previous medication trials:  Zoloft, Prozac, Wellbutrin , Lexapro, Trintellix , Hydroxyzine .    AIMS    Flowsheet Row Admission (Discharged) from 09/21/2022 in [facility] BEHAVIORAL MEDICINE ED  from 09/20/2022 in [facility] Emergency Department at [facility] Office Visit from 12/13/2021 in [facility]  AIMS Total Score 14 14 28    AUDIT    Flowsheet Row Admission (Discharged) from 09/21/2022 in [facility] BEHAVIORAL MEDICINE  Alcohol  Use Disorder Identification Test Final Score (AUDIT) 0   Mini-Mental    Flowsheet Row Office Visit from 07/30/2022 in [facility]  Total Score (max 30 points ) 30   Flowsheet Row Admission (Discharged) from 09/21/2022 in [facility] BEHAVIORAL MEDICINE ED from 09/20/2022 in [facility] Emergency Department at [facility] ED from 09/18/2022 in [facility] Emergency Department at [facility]  C-SSRS RISK CATEGORY Moderate Risk Moderate Risk No Risk     Review of Systems:  Review of Systems  Musculoskeletal:  Negative for gait problem.  Neurological:  Negative for tremors.  Psychiatric/Behavioral:         Please refer to HPI    Medications: I have reviewed the patient's current medications.  Current Outpatient Medications  Medication Sig Dispense Refill   benztropine  (COGENTIN ) 0.5 MG tablet Take 1 tablet (0.5 mg total) by mouth 2 (two) times daily. 60 tablet 2   busPIRone  (BUSPAR ) 5 MG tablet Take 1 tablet (5 mg total) by mouth 2 (two) times daily. 60 tablet 2   cholecalciferol (VITAMIN D3) 25 MCG (1000 UNIT) tablet Take 1,000 Units by mouth daily.     clopidogrel  (PLAVIX ) 75 MG tablet Take 1 tablet (75 mg total) by mouth daily. 30 tablet 0   COD LIVER OIL PO Take 1 capsule by mouth daily.  ezetimibe  (ZETIA ) 10 MG tablet Take 1 tablet (10 mg total) by mouth daily. 30 tablet 0   Melatonin 10 MG CAPS Take 1 tablet by mouth daily.     midodrine  (PROAMATINE ) 2.5 MG tablet Take 2.5 mg by mouth 2 (two) times daily with a meal.     mirtazapine  (REMERON ) 7.5 MG tablet Take 1 tablet (7.5 mg total) by mouth at bedtime. 30 tablet 2   Multiple Vitamin (MULTI VITAMIN)  TABS Take 1 tablet by mouth daily.     pantoprazole (PROTONIX) 40 MG tablet Take 40 mg by mouth every morning.     rosuvastatin  (CRESTOR ) 20 MG tablet Take 1 tablet (20 mg total) by mouth daily. 30 tablet 0   Zinc 30 MG TABS Take 1 tablet by mouth daily.     No current facility-administered medications for this visit.    Medication Side Effects: None  Allergies: No Known Allergies  Past Medical History:  Diagnosis Date   Anxiety    Borderline hyperlipidemia    as of 10/2016: TC 134, TG 90, HDL 51, LDL 95 (PCP recently increase rosuvastatin  dose from 10 to 20 mg)   Borderline hypertension    Coronary artery disease involving native coronary artery of native heart without angina pectoris 03/01/2020   [facility] [place]  [facility]:; Dr. Clerance:  multivessel CAD => 50% pLAD & 60% mLAD, 70% pLCx & 90% mLCx,  70% mid RCA => CABG x 4   Coronary artery disease with history of myocardial infarction without history of CABG    Depression    Family history of premature coronary artery disease 08/26/2017   Hx of CABG x 4 03/19/2020   [facility] [place], [place]  -> CABG x4 (LIMA-LAD, SVG-OM1, SVG-OM2, SVG-dRCA   Hyperlipidemia due to dietary fat intake 08/26/2017    Past Medical History, Surgical history, Social history, and Family history were reviewed and updated as appropriate.   Please see review of systems for further details on the patient's review from today.   Objective:   Physical Exam:  There were no vitals taken for this visit.  Physical Exam Constitutional:      General: She is not in acute distress. Musculoskeletal:        General: No deformity.  Neurological:     Mental Status: She is alert and oriented to person, place, and time.     Coordination: Coordination normal.  Psychiatric:        Attention and Perception: Attention and perception normal. She does not perceive auditory or visual hallucinations.        Mood and  Affect: Mood normal. Mood is not anxious or depressed. Affect is not labile, blunt, angry or inappropriate.        Speech: Speech normal.        Behavior: Behavior normal.        Thought Content: Thought content normal. Thought content is not paranoid or delusional. Thought content does not include homicidal or suicidal ideation. Thought content does not include homicidal or suicidal plan.        Cognition and Memory: Cognition and memory normal.        Judgment: Judgment normal.     Comments: Insight intact     Lab Review:     Component Value Date/Time   NA 142 01/06/2023 1029   K 5.0 01/06/2023 1029   CL 105 01/06/2023 1029   CO2 24 01/06/2023 1029   GLUCOSE 99 01/06/2023 1029   GLUCOSE  99 09/20/2022 1545   BUN 14 01/06/2023 1029   CREATININE 0.72 01/06/2023 1029   CALCIUM  9.6 01/06/2023 1029   PROT 7.7 09/20/2022 1545   PROT 6.0 03/03/2022 0833   ALBUMIN 4.6 09/20/2022 1545   ALBUMIN 4.1 03/03/2022 0833   AST 30 09/20/2022 1545   ALT 21 09/20/2022 1545   ALKPHOS 69 09/20/2022 1545   BILITOT 0.7 09/20/2022 1545   BILITOT 0.5 03/03/2022 0833   GFRNONAA >60 09/20/2022 1545   GFRAA 95 10/16/2017 1412       Component Value Date/Time   WBC 6.2 01/06/2023 1029   WBC 11.2 (H) 09/20/2022 1545   RBC 4.49 01/06/2023 1029   RBC 4.41 09/20/2022 1545   HGB 13.9 01/06/2023 1029   HCT 43.7 01/06/2023 1029   PLT 266 01/06/2023 1029   MCV 97 01/06/2023 1029   MCH 31.0 01/06/2023 1029   MCH 30.8 09/20/2022 1545   MCHC 31.8 01/06/2023 1029   MCHC 33.3 09/20/2022 1545   RDW 12.2 01/06/2023 1029   LYMPHSABS 0.9 09/18/2022 1902   MONOABS 0.7 09/18/2022 1902   EOSABS 0.0 09/18/2022 1902   BASOSABS 0.0 09/18/2022 1902    No results found for: POCLITH, LITHIUM   No results found for: PHENYTOIN, PHENOBARB, VALPROATE, CBMZ   .res Assessment: Plan:   Plan:  PDMP reviewed  Buspar  5mg  BID Remeron  7.5 MG tablet at bedtime - depression/anxiety Cogentin  0.5mg  BID -  movement disorder.     Discussed medications and treatment plans with patient. Patient totally disabled and unable to work at this time with her anxiety, panic attacks and cognitive deficits.    RTC 4/6 weeks  25 minutes spent dedicated to the care of this patient on the date of this encounter to include pre-visit review of records, ordering of medication, post visit documentation, and face-to-face time with the patient discussing MDD, GAD, panic attacks and TD. Discussed continuing current medication regimen, but discussed possibly increasing Cogentin  for TD symptoms - does not want to increase dose today.  Patient advised to contact office with any questions, adverse effects, or acute worsening in signs and symptoms.   Discussed potential benefits, risk, and side effects of benzodiazepines to include potential risk of tolerance and dependence, as well as possible drowsiness. Advised patient not to drive if experiencing drowsiness and to take lowest possible effective dose to minimize risk of dependence and tolerance.   There are no diagnoses linked to this encounter.   Please see After Visit Summary for patient specific instructions.  Future Appointments  Date Time Provider Department Center  11/30/2023  2:00 PM Bernardo Bruckner, [facility] CP-CP None  02/29/2024  2:30 PM Sandridge, Erminio POUR, PA-C [facility] None    No orders of the defined types were placed in this encounter.   -------------------------------

## 2023-11-30 ENCOUNTER — Ambulatory Visit: Admitting: Mental Health

## 2023-11-30 DIAGNOSIS — F331 Major depressive disorder, recurrent, moderate: Secondary | ICD-10-CM | POA: Diagnosis not present

## 2023-11-30 DIAGNOSIS — F411 Generalized anxiety disorder: Secondary | ICD-10-CM

## 2023-11-30 DIAGNOSIS — F410 Panic disorder [episodic paroxysmal anxiety] without agoraphobia: Secondary | ICD-10-CM | POA: Diagnosis not present

## 2023-11-30 NOTE — Progress Notes (Signed)
 [facility]   Name: Heather Lucas Date:  11/30/23 MRN: [id number] DOB: [birth year] PCP: Heather Lucas., MD   Time spent:  50 minutes   Treatment:  Ind. therapy   Mental Status Exam:         Appearance:    Casual      Behavior:   Appropriate   Motor:   WNL   Speech/Language:    Clear and Coherent   Affect:   Full range    Mood:   anxious pleasant   Thought process:   Logical, linear, goal directed   Thought content:     WNL   Sensory/Perceptual disturbances:     none   Orientation:   x4   Attention:   Good   Concentration:   Good   Memory:   Intact   Fund of knowledge:    Consistent with age and development   Insight:     Good   Judgment:    Good   Impulse Control:   Good       Reported Symptoms:  anxiety daily (10/10), depression (3/10)   Risk Assessment: Danger to Self:  No Self-injurious Behavior: No Danger to Others: No Duty to Warn:no Physical Aggression / Violence:No  Access to Firearms a concern: No  Gang Involvement:No  Patient / guardian was educated about steps to take if suicide or homicide risk level increases between visits: yes While future psychiatric events cannot be accurately predicted, the patient does not currently require acute inpatient psychiatric care and does not currently meet [place]  involuntary commitment criteria.          Medications:       Current Outpatient Medications  Medication Sig Dispense Refill   aspirin 81 MG EC tablet Take 81 mg by mouth daily.       buPROPion  (WELLBUTRIN  XL) 300 MG 24 hr tablet Take 1 tablet (300 mg total) by mouth daily. 30 tablet 2   clonazePAM  (KLONOPIN ) 1 MG tablet TAKE ONE TABLET BY MOUTH TWICE DAILY 60 tablet 2   clopidogrel  (PLAVIX ) 75 MG tablet Take 75 mg by mouth daily.       ergocalciferol  (VITAMIN D2) 1.25 MG (50000 UT) capsule Take 50,000 Units by mouth daily.       ezetimibe  (ZETIA ) 10 MG tablet Take 1 tablet (10 mg total) by mouth daily. 90 tablet 3    midodrine  (PROAMATINE ) 5 MG tablet Take 5 mg  in the morning  and take 2.5 mg ( 1/2 tablet ) in the evening.       mirtazapine  (REMERON ) 15 MG tablet Take 1 tablet (15 mg total) by mouth at bedtime. 30 tablet 2   rosuvastatin  (CRESTOR ) 20 MG tablet Take 20 mg by mouth daily.   5   Sennosides 15 MG TABS Take 15 mg by mouth daily. Takes 8 tablets a night       valbenazine  (INGREZZA ) 40 MG capsule Take 1 capsule (40 mg total) by mouth daily. 30 capsule 5   valbenazine  (INGREZZA ) 40 MG capsule Take 1 capsule (40 mg total) by mouth daily. 30 capsule 0    No current facility-administered medications for this visit.        Subjective:  Patient presents on time for today's session.  Assessed progress for she shared how she learned from her attorney that she is going to have her disability review at the end of this January.  She reports some ongoing stress with her financial situation, she stated  that she is been able to try and manage her budget and was effective in doing last month.  She reports having more anxiety since talking to her attorney last week, feeling some panic intermittently.  Explored collaboratively, with a focus on facilitating her identifying associated thoughts.  She was able to identify self supportive thoughts toward knowing that she can try and manage her financial situation until that time although she will not have any extra money for gifts at Christmas.  She was able to identify how she can try and focus on knowing that she will be okay financially.  Other ways to cope and care for self, provide some psychoeducation related to the sympathetic and parasympathetic nervous system responses, reviewed diaphragmatic breathing and explored collaboratively other mindfulness activities.   Interventions: CBT, supportive therapy, motivational interviewing   Diagnoses:  Panic disorder Generalized anxiety disorder Major depressive disorder, recurrent, moderate   Plan: Patient is to use  CBT, mindfulness and coping skills to help manage decrease symptoms associated with their diagnosis.     Long-term goals:   Maintain symptom reduction: The patient will report sustained reduction in symptoms of anxiety using both CBT and mindfulness interventions for 3 consecutive months progressively.  Improve emotional regulation: The patient will learn and apply CBT and mindfulness-based strategies to regulate emotions and report an improvement in emotional regulation for at least 3 consecutive months progressively.   Short-term goal:  The patient will learn and apply CBT and mindfulness-based coping skills for managing anxiety and practice using it between sessions.       2.   Increase awareness of automatic thoughts: The patient will learn to identify automatic thoughts and increase awareness of their impact on emotions and behaviors through both CBT and mindfulness-based interventions.       3.    Identify coping related to managing her panic attacks and decrease frequency of the attacks from daily to 0-1 times per week         Lonni Fischer, [facility]

## 2023-12-15 ENCOUNTER — Telehealth: Payer: Self-pay | Admitting: Adult Health

## 2023-12-15 DIAGNOSIS — Z0289 Encounter for other administrative examinations: Secondary | ICD-10-CM

## 2023-12-15 NOTE — Telephone Encounter (Signed)
 Heather Lucas dropped off a 2 pg form for a disability appeal her lawyer is doing for her. It is her LAST appeal for disability. She has pre-paid and would like it completed and sent asap by fax. Given to [facility] 11/4.

## 2023-12-17 NOTE — Telephone Encounter (Signed)
Paperwork completed and faxed per request

## 2023-12-29 ENCOUNTER — Ambulatory Visit: Admitting: Mental Health

## 2023-12-30 ENCOUNTER — Ambulatory Visit: Admitting: Adult Health

## 2023-12-30 ENCOUNTER — Encounter: Payer: Self-pay | Admitting: Adult Health

## 2023-12-30 DIAGNOSIS — F411 Generalized anxiety disorder: Secondary | ICD-10-CM | POA: Diagnosis not present

## 2023-12-30 DIAGNOSIS — F410 Panic disorder [episodic paroxysmal anxiety] without agoraphobia: Secondary | ICD-10-CM | POA: Diagnosis not present

## 2023-12-30 DIAGNOSIS — F331 Major depressive disorder, recurrent, moderate: Secondary | ICD-10-CM | POA: Diagnosis not present

## 2023-12-30 DIAGNOSIS — G2401 Drug induced subacute dyskinesia: Secondary | ICD-10-CM

## 2023-12-30 NOTE — Progress Notes (Signed)
 Heather Lucas [id number] [birth year] 63 y.o.  Subjective:   Patient ID:  Heather Lucas is a 63 y.o. (DOB 12-Dec-1960) female.  Chief Complaint: No chief complaint on file.   HPI Heather Lucas presents to the office today for follow-up of MDD, GAD, panic attacks and TD.  Describes mood today as ok. Pleasant. Reports tearfulness. Mood symptoms - reports depression. Reports lower interest and motivation. Reports she is completing some tasks - things I have to do. Reports anxiety - every day - it comes and goes. Denies irritability. Reports some panic attacks. Reports some worry, rumination and over thinking. Denies obsessive thoughts. Reports mood is variable - having some ups and downs. Stating I feel like I'm doing about the same. Feels like current medications are helpful. Taking medications as prescribed.  Energy levels medium. Active, working on a regular exercise routine - it's intermittent. Enjoys some usual interests and activities. Single. Lives alone. Attends church twice a week. Friends local. Brother in [place] . Appetite adequate. Weight stable - 130 pounds - 61.  Sleeps well at night. Averages 9 hours.   Reports difficulties with focus and concentration - it's still an issue. Does not feel like she is able to return to work . Feels like her memory issues and anxiety are preventing her from working a job.  Completing minimal tasks at home. Has been approved for early retirement benefits. She is working with an pensions consultant for [employer] benefits.  Denies SI and HI. Denies AH or AH. Denies self harm. Denies substance use.  Previous medication trials:  Zoloft, Prozac, Wellbutrin , Lexapro, Trintellix , Hydroxyzine .    AIMS    Flowsheet Row Admission (Discharged) from 09/21/2022 in [facility] BE[facility]AV[facility]AL MEDICINE ED from 09/20/2022 in [facility] Office Visit from 12/13/2021 in [facility]  AIMS  Total Score 14 14 28    AUDIT    Flowsheet Row Admission (Discharged) from 09/21/2022 in [facility]  Alcohol  Use Disorder Identification Test Final Score (AUDIT) 0   Mini-Mental    Flowsheet Row Office Visit from 07/30/2022 in [facility]  Total Score (max 30 points ) 30   Flowsheet Row Admission (Discharged) from 09/21/2022 in [facility] BEHAVIORAL MEDICINE ED from 09/20/2022 in [facility] Emergency Department at [facility] ED from 09/18/2022 in [facility] Department at [facility]  C-SSRS RISK CATEGORY Moderate Risk Moderate Risk No Risk     Review of Systems:  Review of Systems  Musculoskeletal:  Negative for gait problem.  Neurological:  Negative for tremors.  Psychiatric/Behavioral:         Please refer to HPI    Medications: I have reviewed the patient's current medications.  Current Outpatient Medications  Medication Sig Dispense Refill   benztropine  (COGENTIN ) 0.5 MG tablet Take 1 tablet (0.5 mg total) by mouth 2 (two) times daily. 60 tablet 2   busPIRone  (BUSPAR ) 5 MG tablet Take 1 tablet (5 mg total) by mouth 2 (two) times daily. 60 tablet 2   cholecalciferol (VITAMIN D3) 25 MCG (1000 UNIT) tablet Take 1,000 Units by mouth daily.     clopidogrel  (PLAVIX ) 75 MG tablet Take 1 tablet (75 mg total) by mouth daily. 30 tablet 0   COD LIVER OIL PO Take 1 capsule by mouth daily.     ezetimibe  (ZETIA ) 10 MG tablet Take 1 tablet (10 mg total) by mouth daily. 30 tablet 0   Melatonin 10 MG CAPS Take 1  tablet by mouth daily.     midodrine  (PROAMATINE ) 2.5 MG tablet Take 2.5 mg by mouth 2 (two) times daily with a meal.     mirtazapine  (REMERON ) 7.5 MG tablet Take 1 tablet (7.5 mg total) by mouth at bedtime. 30 tablet 2   Multiple Vitamin (MULTI VITAMIN) TABS Take 1 tablet by mouth daily.     pantoprazole (PROTONIX) 40 MG tablet Take 40 mg by mouth every morning.     rosuvastatin  (CRESTOR ) 20 MG tablet  Take 1 tablet (20 mg total) by mouth daily. 30 tablet 0   Zinc 30 MG TABS Take 1 tablet by mouth daily.     No current facility-administered medications for this visit.    Medication Side Effects: None  Allergies: No Known Allergies  Past Medical History:  Diagnosis Date   Anxiety    Borderline hyperlipidemia    as of 10/2016: TC 134, TG 90, HDL 51, LDL 95 (PCP recently increase rosuvastatin  dose from 10 to 20 mg)   Borderline hypertension    Coronary artery disease involving native coronary artery of native heart without angina pectoris 03/01/2020   [facility] [place]  [facility]:; Dr. Clerance:  multivessel CAD => 50% pLAD & 60% mLAD, 70% pLCx & 90% mLCx,  70% mid RCA => CABG x 4   Coronary artery disease with history of myocardial infarction without history of CABG    Depression    Family history of premature coronary artery disease 08/26/2017   Hx of CABG x 4 03/19/2020   [facility] [place], [place]  -> CABG x4 (LIMA-LAD, SVG-OM1, SVG-OM2, SVG-dRCA   Hyperlipidemia due to dietary fat intake 08/26/2017    Past Medical History, Surgical history, Social history, and Family history were reviewed and updated as appropriate.   Please see review of systems for further details on the patient's review from today.   Objective:   Physical Exam:  There were no vitals taken for this visit.  Physical Exam Constitutional:      General: She is not in acute distress. Musculoskeletal:        General: No deformity.  Neurological:     Mental Status: She is alert and oriented to person, place, and time.     Coordination: Coordination normal.  Psychiatric:        Attention and Perception: Attention and perception normal. She does not perceive auditory or visual hallucinations.        Mood and Affect: Mood normal. Mood is not anxious or depressed. Affect is not labile, blunt, angry or inappropriate.        Speech: Speech normal.        Behavior:  Behavior normal.        Thought Content: Thought content normal. Thought content is not paranoid or delusional. Thought content does not include homicidal or suicidal ideation. Thought content does not include homicidal or suicidal plan.        Cognition and Memory: Cognition and memory normal.        Judgment: Judgment normal.     Comments: Insight intact     Lab Review:     Component Value Date/Time   NA 142 01/06/2023 1029   K 5.0 01/06/2023 1029   CL 105 01/06/2023 1029   CO2 24 01/06/2023 1029   GLUCOSE 99 01/06/2023 1029   GLUCOSE 99 09/20/2022 1545   BUN 14 01/06/2023 1029   CREATININE 0.72 01/06/2023 1029   CALCIUM  9.6 01/06/2023 1029   PROT 7.7  09/20/2022 1545   PROT 6.0 03/03/2022 0833   ALBUMIN 4.6 09/20/2022 1545   ALBUMIN 4.1 03/03/2022 0833   AST 30 09/20/2022 1545   ALT 21 09/20/2022 1545   ALKPHOS 69 09/20/2022 1545   BILITOT 0.7 09/20/2022 1545   BILITOT 0.5 03/03/2022 0833   GFRNONAA >60 09/20/2022 1545   GFRAA 95 10/16/2017 1412       Component Value Date/Time   WBC 6.2 01/06/2023 1029   WBC 11.2 (H) 09/20/2022 1545   RBC 4.49 01/06/2023 1029   RBC 4.41 09/20/2022 1545   HGB 13.9 01/06/2023 1029   HCT 43.7 01/06/2023 1029   PLT 266 01/06/2023 1029   MCV 97 01/06/2023 1029   MCH 31.0 01/06/2023 1029   MCH 30.8 09/20/2022 1545   MCHC 31.8 01/06/2023 1029   MCHC 33.3 09/20/2022 1545   RDW 12.2 01/06/2023 1029   LYMPHSABS 0.9 09/18/2022 1902   MONOABS 0.7 09/18/2022 1902   EOSABS 0.0 09/18/2022 1902   BASOSABS 0.0 09/18/2022 1902    No results found for: POCLITH, LITHIUM   No results found for: PHENYTOIN, PHENOBARB, VALPROATE, CBMZ   .res Assessment: Plan:    Plan:  PDMP reviewed  Buspar  5mg  BID Remeron  7.5 MG tablet at bedtime - depression/anxiety Cogentin  0.5mg  BID - movement disorder.     Discussed medications and treatment plans with patient. Patient totally disabled and unable to work at this time with her anxiety,  panic attacks and cognitive deficits.    RTC 4/6 weeks  25 minutes spent dedicated to the care of this patient on the date of this encounter to include pre-visit review of records, ordering of medication, post visit documentation, and face-to-face time with the patient discussing MDD, GAD, panic attacks and TD. Discussed continuing current medication regimen.  Patient advised to contact office with any questions, adverse effects, or acute worsening in signs and symptoms.   Discussed potential benefits, risk, and side effects of benzodiazepines to include potential risk of tolerance and dependence, as well as possible drowsiness. Advised patient not to drive if experiencing drowsiness and to take lowest possible effective dose to minimize risk of dependence and tolerance.   Diagnoses and all orders for this visit:  Major depressive disorder, recurrent episode, moderate (HCC)  Generalized anxiety disorder  Panic attacks  Tardive dyskinesia     Please see After Visit Summary for patient specific instructions.  Future Appointments  Date Time Provider Department Center  02/29/2024  2:30 PM Orman Erminio POUR, PA-C [facility] None    No orders of the defined types were placed in this encounter.   -------------------------------

## 2024-01-05 ENCOUNTER — Telehealth (HOSPITAL_BASED_OUTPATIENT_CLINIC_OR_DEPARTMENT_OTHER): Payer: Self-pay | Admitting: *Deleted

## 2024-01-05 NOTE — Telephone Encounter (Signed)
   Pre-operative Risk Assessment    Patient Name: Heather Lucas  DOB: 1960-07-06 MRN: [id number]   Date of last office visit: 07/01/23 DR. HARDING Date of next office visit: NONE   Request for Surgical Clearance    Procedure:  COLONOSCOPY  Date of Surgery:  Clearance 02/16/24                                Surgeon:  DR. ROSALIE Surgeon's Group or Practice Name:  [facility] Phone number:  [phone] Fax number:  [phone]   Type of Clearance Requested:   - Medical  - Pharmacy:  Hold Clopidogrel  (Plavix ) x 5 DAYS PRIOR   Type of Anesthesia:  PROPOFOL   Additional requests/questions:    [name]   01/05/2024, 8:32 AM

## 2024-01-05 NOTE — Telephone Encounter (Signed)
 Pt has been scheduled tele preop appt 01/27/24. Med rec and consent are done.      Patient Consent for Virtual Visit        Heather Lucas has provided verbal consent on 01/05/2024 for a virtual visit (video or telephone).   CONSENT FOR VIRTUAL VISIT FOR:  Heather Lucas  By participating in this virtual visit I agree to the following:  I hereby voluntarily request, consent and authorize [facility] and its employed or contracted physicians, physician assistants, nurse practitioners or other licensed health care professionals (the Practitioner), to provide me with telemedicine health care services (the "Services) as deemed necessary by the treating Practitioner. I acknowledge and consent to receive the Services by the Practitioner via telemedicine. I understand that the telemedicine visit will involve communicating with the Practitioner through live audiovisual communication technology and the disclosure of certain medical information by electronic transmission. I acknowledge that I have been given the opportunity to request an in-person assessment or other available alternative prior to the telemedicine visit and am voluntarily participating in the telemedicine visit.  I understand that I have the right to withhold or withdraw my consent to the use of telemedicine in the course of my care at any time, without affecting my right to future care or treatment, and that the Practitioner or I may terminate the telemedicine visit at any time. I understand that I have the right to inspect all information obtained and/or recorded in the course of the telemedicine visit and may receive copies of available information for a reasonable fee.  I understand that some of the potential risks of receiving the Services via telemedicine include:  Delay or interruption in medical evaluation due to technological equipment failure or disruption; Information transmitted may not be sufficient (e.g. poor  resolution of images) to allow for appropriate medical decision making by the Practitioner; and/or  In rare instances, security protocols could fail, causing a breach of personal health information.  Furthermore, I acknowledge that it is my responsibility to provide information about my medical history, conditions and care that is complete and accurate to the best of my ability. I acknowledge that Practitioner's advice, recommendations, and/or decision may be based on factors not within their control, such as incomplete or inaccurate data provided by me or distortions of diagnostic images or specimens that may result from electronic transmissions. I understand that the practice of medicine is not an exact science and that Practitioner makes no warranties or guarantees regarding treatment outcomes. I acknowledge that a copy of this consent can be made available to me via my patient portal [facility] MyChart), or I can request a printed copy by calling the office of [facility].    I understand that my insurance will be billed for this visit.   I have read or had this consent read to me. I understand the contents of this consent, which adequately explains the benefits and risks of the Services being provided via telemedicine.  I have been provided ample opportunity to ask questions regarding this consent and the Services and have had my questions answered to my satisfaction. I give my informed consent for the services to be provided through the use of telemedicine in my medical care

## 2024-01-05 NOTE — Telephone Encounter (Signed)
 Pt has been scheduled tele preop appt 01/27/24. Med rec and consent are done.

## 2024-01-05 NOTE — Telephone Encounter (Signed)
   Name: Heather Lucas  DOB: 30-Jan-1961  MRN: [id number]  Primary Cardiologist: Alm Clay, MD   Preoperative team, please contact this patient and set up a phone call appointment for further preoperative risk assessment. Please obtain consent and complete medication review. Thank you for your help.  I confirm that guidance regarding antiplatelet and oral anticoagulation therapy has been completed and, if necessary, noted below.  Per Dr. Clay, she may hold Plavix  for 5 days prior to procedure. Please resume Plavix  as soon as possible postprocedure, at the discretion of the surgeon.    I also confirmed the patient resides in the state of [place] . As per [facility] Medical Board telemedicine laws, the patient must reside in the state in which the provider is licensed.   Damien JAYSON Braver, NP 01/05/2024, 8:44 AM [facility]

## 2024-01-18 ENCOUNTER — Ambulatory Visit: Admitting: Mental Health

## 2024-01-18 DIAGNOSIS — F331 Major depressive disorder, recurrent, moderate: Secondary | ICD-10-CM | POA: Diagnosis not present

## 2024-01-18 NOTE — Progress Notes (Signed)
 [facility]   Name: Heather Lucas Date: 01/18/24 MRN: [id number] DOB: 1960-03-12 PCP: Heather Lucas., MD   Time spent:  51 minutes   Treatment:  Ind. therapy   Mental Status Exam:         Appearance:    Casual      Behavior:   Appropriate   Motor:   WNL   Speech/Language:    Clear and Coherent   Affect:   Full range    Mood:   Euthymic   Thought process:   Logical, linear, goal directed   Thought content:     WNL   Sensory/Perceptual disturbances:     none   Orientation:   x4   Attention:   Good   Concentration:   Good   Memory:   Intact   Fund of knowledge:    Consistent with age and development   Insight:     Good   Judgment:    Good   Impulse Control:   Good       Reported Symptoms:  anxiety daily (10/10), depression (3/10)   Risk Assessment: Danger to Self:  No Self-injurious Behavior: No Danger to Others: No Duty to Warn:no Physical Aggression / Violence:No  Access to Firearms a concern: No  Gang Involvement:No  Patient / guardian was educated about steps to take if suicide or homicide risk level increases between visits: yes While future psychiatric events cannot be accurately predicted, the patient does not currently require acute inpatient psychiatric care and does not currently meet [place]  involuntary commitment criteria.          Medications:       Current Outpatient Medications  Medication Sig Dispense Refill   aspirin 81 MG EC tablet Take 81 mg by mouth daily.       buPROPion  (WELLBUTRIN  XL) 300 MG 24 hr tablet Take 1 tablet (300 mg total) by mouth daily. 30 tablet 2   clonazePAM  (KLONOPIN ) 1 MG tablet TAKE ONE TABLET BY MOUTH TWICE DAILY 60 tablet 2   clopidogrel  (PLAVIX ) 75 MG tablet Take 75 mg by mouth daily.       ergocalciferol  (VITAMIN D2) 1.25 MG (50000 UT) capsule Take 50,000 Units by mouth daily.       ezetimibe  (ZETIA ) 10 MG tablet Take 1 tablet (10 mg total) by mouth daily. 90 tablet 3   midodrine   (PROAMATINE ) 5 MG tablet Take 5 mg  in the morning  and take 2.5 mg ( 1/2 tablet ) in the evening.       mirtazapine  (REMERON ) 15 MG tablet Take 1 tablet (15 mg total) by mouth at bedtime. 30 tablet 2   rosuvastatin  (CRESTOR ) 20 MG tablet Take 20 mg by mouth daily.   5   Sennosides 15 MG TABS Take 15 mg by mouth daily. Takes 8 tablets a night       valbenazine  (INGREZZA ) 40 MG capsule Take 1 capsule (40 mg total) by mouth daily. 30 capsule 5   valbenazine  (INGREZZA ) 40 MG capsule Take 1 capsule (40 mg total) by mouth daily. 30 capsule 0    No current facility-administered medications for this visit.        Subjective:  Patient presents on time for today's session.  Assessed progress since last visit.  Patient shared how she continues to await disability determination, learning recently that she will have her hearing in late January.  She is hopeful that she will be approved.  She continues to cope  with financial stress and mentally to share some details related to how she is trying to budget, manage her finances to where she is able to maintain, cope and care for herself.  Although she states she is not the best cook, she plans on continuing to make efforts as this will also help financially.  She shared how she has been mindful of spending, expressed gratitude for the opportunity to find low income housing.  She went on to share how she enjoys her apartment, sharing details, what it means to her to be able to have her own affordable residence.  She was able to further identify ways she tries to cope and care for herself related to her thoughts, such as her reminding herself that she will be okay regardless of the outcome of the hearing in January.  She continues to value her friendships through her church, sharing some recent experiences.     Interventions: CBT, supportive therapy, motivational interviewing   Diagnoses:  Panic disorder Generalized anxiety disorder Major depressive disorder,  recurrent, moderate   Plan: Patient is to use CBT, mindfulness and coping skills to help manage decrease symptoms associated with their diagnosis.     Long-term goals:   Maintain symptom reduction: The patient will report sustained reduction in symptoms of anxiety using both CBT and mindfulness interventions for 3 consecutive months progressively.  Improve emotional regulation: The patient will learn and apply CBT and mindfulness-based strategies to regulate emotions and report an improvement in emotional regulation for at least 3 consecutive months progressively.   Short-term goal:  The patient will learn and apply CBT and mindfulness-based coping skills for managing anxiety and practice using it between sessions.       2.   Increase awareness of automatic thoughts: The patient will learn to identify automatic thoughts and increase awareness of their impact on emotions and behaviors through both CBT and mindfulness-based interventions.       3.    Identify coping related to managing her panic attacks and decrease frequency of the attacks from daily to 0-1 times per week         Lonni Fischer, [facility]

## 2024-01-27 ENCOUNTER — Encounter: Payer: Self-pay | Admitting: Adult Health

## 2024-01-27 ENCOUNTER — Ambulatory Visit: Admitting: Adult Health

## 2024-01-27 ENCOUNTER — Ambulatory Visit: Attending: Cardiology

## 2024-01-27 DIAGNOSIS — Z01810 Encounter for preprocedural cardiovascular examination: Secondary | ICD-10-CM | POA: Diagnosis not present

## 2024-01-27 DIAGNOSIS — G2401 Drug induced subacute dyskinesia: Secondary | ICD-10-CM | POA: Diagnosis not present

## 2024-01-27 DIAGNOSIS — F331 Major depressive disorder, recurrent, moderate: Secondary | ICD-10-CM | POA: Diagnosis not present

## 2024-01-27 DIAGNOSIS — F411 Generalized anxiety disorder: Secondary | ICD-10-CM | POA: Diagnosis not present

## 2024-01-27 DIAGNOSIS — F410 Panic disorder [episodic paroxysmal anxiety] without agoraphobia: Secondary | ICD-10-CM | POA: Diagnosis not present

## 2024-01-27 NOTE — Progress Notes (Signed)
 Virtual Visit via Telephone Note   Because of Heather Lucas co-morbid illnesses, she is at least at moderate risk for complications without adequate follow up.  This format is felt to be most appropriate for this patient at this time.  Due to technical limitations with video connection (technology), today's appointment will be conducted as an audio only telehealth visit, and Heather Lucas verbally agreed to proceed in this manner.   All issues noted in this document were discussed and addressed.  No physical exam could be performed with this format.  Evaluation Performed:  Preoperative cardiovascular risk assessment _____________   Date:  01/27/2024   Patient ID:  Heather Lucas, DOB [birth year], MRN [id number] Patient Location:  Home Provider location:   Office  Primary Care Provider:  Loreli Elsie [name], MD Primary Cardiologist:  Alm Clay, MD  Chief Complaint / Patient Profile   63 y.o. y/o female with a h/o anxiety, depression, coronary artery disease status post CABG x 4, family history of premature coronary artery disease, hyperlipidemia, hypertension who is pending colonoscopy and presents today for telephonic preoperative cardiovascular risk assessment.  History of Present Illness    Heather Lucas is a 63 y.o. female who presents via audio/video conferencing for a telehealth visit today.  Pt was last seen in cardiology clinic on 07/01/2023 by Dr. Clay.  At that time Heather Lucas was doing well.  The patient is now pending procedure as outlined above. Since her last visit, she has been fine. No CP, SOB, or swelling outside her norm. She has some SOB but this is chronic. She likes to go hiking and walking on trails.  She does meet 4 METS on the DASI.  Per Dr. Clay, she may hold Plavix  for 5 days prior to procedure. Please resume Plavix  as soon as possible postprocedure, at the discretion of the surgeon.   Past Medical History    Past Medical History:  Diagnosis  Date   Anxiety    Borderline hyperlipidemia    as of 10/2016: TC 134, TG 90, HDL 51, LDL 95 (PCP recently increase rosuvastatin  dose from 10 to 20 mg)   Borderline hypertension    Coronary artery disease involving native coronary artery of native heart without angina pectoris 03/01/2020   [facility] [place]  [facility]:; Dr. Clerance:  multivessel CAD => 50% pLAD & 60% mLAD, 70% pLCx & 90% mLCx,  70% mid RCA => CABG x 4   Coronary artery disease with history of myocardial infarction without history of CABG    Depression    Family history of premature coronary artery disease 08/26/2017   Hx of CABG x 4 03/19/2020   [facility], [place], [place]  -> CABG x4 (LIMA-LAD, SVG-OM1, SVG-OM2, SVG-dRCA   Hyperlipidemia due to dietary fat intake 08/26/2017   Past Surgical History:  Procedure Laterality Date   CORONARY ARTERY BYPASS GRAFT  03/2020   [facility], [place], [place]  -> reportedly CABG x4   GXT/ETT  02/23/2020   From [facility] [place]  [facility]:: 9:30 min (Stage 3). 10.8 METS 80% MPHR; NO CP but + DOE. -> 1-2 mm Inf De[pression & AVR STE = c/w Ischemia   LAPAROSCOPIC CHOLECYSTECTOMY  1995   LEFT HEART CATH AND CORONARY ANGIOGRAPHY  03/01/2020   [facility] [place]  [facility] ([facility]; Dr. Clerance:  multivessel CAD => 50% pLAD & 60% mLAD, 70% pLCx & 90% mLCx,  70% mid RCA => CABG   NM MYOVIEW   LTD  02/2018   Clearly appears abnormal, referred for [place], [place]    TRANSTHORACIC ECHOCARDIOGRAM  02/16/2020   [facility] [place]  [facility] ([facility]:  a) Echo 02/16/2020: EF 60% with mild MR and TR.; b) Post Op Limited Echo: EF 55-60%.  Normal wall motion.  Normal RV.  Mild RA dilation.  No pericardial effusion.  Left pleural effusion noted.   TRANSTHORACIC ECHOCARDIOGRAM  07/2021   ([facility])  EF 65 to 70%.  No RWMA other than possible septal  motion.  Mild MAC.  Aortic sclerosis.  Otherwise normal.    Allergies  Allergies[1]  Home Medications    Prior to Admission medications  Medication Sig Start Date End Date Taking? Authorizing Provider  benztropine  (COGENTIN ) 0.5 MG tablet Take 1 tablet (0.5 mg total) by mouth 2 (two) times daily. 11/25/23   Mozingo, Regina Nattalie, NP  busPIRone  (BUSPAR ) 5 MG tablet Take 1 tablet (5 mg total) by mouth 2 (two) times daily. 11/25/23   Mozingo, Regina Nattalie, NP  cholecalciferol (VITAMIN D3) 25 MCG (1000 UNIT) tablet Take 1,000 Units by mouth daily.    [provider]  clopidogrel  (PLAVIX ) 75 MG tablet Take 1 tablet (75 mg total) by mouth daily. 09/27/22   Victoria Ruts, MD  COD LIVER OIL PO Take 1 capsule by mouth daily.    [provider]  ezetimibe  (ZETIA ) 10 MG tablet Take 1 tablet (10 mg total) by mouth daily. 09/26/22   Victoria Ruts, MD  Melatonin 10 MG CAPS Take 1 tablet by mouth daily.    [provider]  midodrine  (PROAMATINE ) 2.5 MG tablet Take 2.5 mg by mouth 2 (two) times daily with a meal.    [provider]  mirtazapine  (REMERON ) 7.5 MG tablet Take 1 tablet (7.5 mg total) by mouth at bedtime. 11/25/23   Mozingo, Regina Nattalie, NP  Multiple Vitamin (MULTI VITAMIN) TABS Take 1 tablet by mouth daily.    [provider]  pantoprazole (PROTONIX) 40 MG tablet Take 40 mg by mouth every morning. 03/05/23   [provider]  rosuvastatin  (CRESTOR ) 20 MG tablet Take 1 tablet (20 mg total) by mouth daily. 09/26/22   Victoria Ruts, MD  Zinc 30 MG TABS Take 1 tablet by mouth daily.    [provider]    Physical Exam    Vital Signs:  [name] does not have vital signs available for review today.  Given telephonic nature of communication, physical exam is limited. AAOx3. NAD. Normal affect.  Speech and respirations are unlabored.  Accessory Clinical Findings    None  Assessment & Plan    1.   Preoperative Cardiovascular Risk Assessment: } Ms. Silman perioperative risk of a major cardiac event is 0.9% according to the Revised Cardiac Risk Index (RCRI).  Therefore, she is at low risk for perioperative complications.   Her functional capacity is good at 5.07 METs according to the [facility] Activity Status Index (DASI). Recommendations: According to ACC/AHA guidelines, no further cardiovascular testing needed.  The patient may proceed to surgery at acceptable risk.   Antiplatelet and/or Anticoagulation Recommendations: Clopidogrel  (Plavix ) can be held for 5 days prior to her surgery and resumed as soon as possible post op.  The patient was advised that if she develops new symptoms prior to surgery to contact our office to arrange for a follow-up visit, and she verbalized understanding.   A copy of this note will be routed to requesting surgeon.  Time:   Today, I  have spent 6 minutes with the patient with telehealth technology discussing medical history, symptoms, and management plan.     Orren LOISE Fabry, PA-C  01/27/2024, 12:32 PM      [1] No Known Allergies

## 2024-01-27 NOTE — Progress Notes (Signed)
 [name] [id number] [birth year] 63 y.o.  Subjective:   Patient ID:  Heather Lucas is a 63 y.o. (DOB 29-Mar-1960) female.  Chief Complaint: No chief complaint on file.   HPI Heather Lucas presents to the office today for follow-up of MDD, GAD, panic attacks and TD.  Describes mood today as ok. Pleasant. Reports tearfulness. Mood symptoms - reports depression. Reports lower interest and motivation. Reports she is completing some tasks around the house. Reports anxiety - it's kinda medium - worse in the mornings. Reports some irritability. Reports some panic attacks. Reports increased worry, rumination and over thinking. Denies obsessive thoughts. Reports mood is variable - it's still up and down. Stating I feel like I'm about the same. Feels like current medications are helpful. Taking medications as prescribed.  Energy levels fair, still has to rest a lot. Active, working on a regular exercise routine - it's intermittent. Enjoys some usual interests and activities. Single. Lives alone. Attends church twice a week. Friends local. Brother in [place] . Appetite adequate. Weight stable - 130 pounds - 61.  Sleeps well at night. Averages 9 hours.   Reports difficulties with focus and concentration - general. She does not feel like she is able to return to the work setting . Reports memory issues and anxiety are preventing her from working a job. Completing minimal tasks at home - cleaning - laundry. Has been approved for early retirement benefits. She is working with an pensions consultant for SSDI benefits - 03/08/2024 meets with judge.  Denies SI and HI. Denies AH or AH. Denies self harm. Denies substance use.  Previous medication trials:  Zoloft, Prozac, Wellbutrin , Lexapro, Trintellix , Hydroxyzine .   AIMS    Flowsheet Row Admission (Discharged) from 09/21/2022 in [facility] BE[facility]AL [facility] ED from 09/20/2022 in [facility] Emergency Department at [facility]  Office Visit from 12/13/2021 in [facility]  AIMS Total Score 14 14 28    AUDIT    Flowsheet Row Admission (Discharged) from 09/21/2022 in [facility]  Alcohol  Use Disorder Identification Test Final Score (AUDIT) 0   Mini-Mental    Flowsheet Row Office Visit from 07/30/2022 in [facility]  Total Score (max 30 points ) 30   Flowsheet Row Admission (Discharged) from 09/21/2022 in [facility] BE[facility]AVIORAL MEDICINE ED from 09[facility]20/2022 in [facility] Emergency Department at [facility] ED from 09/18/2022 in [facility] Emergency Department at [facility]  C-SSRS RISK CATEGORY Moderate Risk Moderate Risk No Risk     Review of Systems:  Review of Systems  Musculoskeletal:  Negative for gait problem.  Neurological:  Negative for tremors.  Psychiatric/Behavioral:         Please refer to HPI    Medications: I have reviewed the patient's current medications.  Current Outpatient Medications  Medication Sig Dispense Refill   benztropine  (COGENTIN ) 0.5 MG tablet Take 1 tablet (0.5 mg total) by mouth 2 (two) times daily. 60 tablet 2   busPIRone  (BUSPAR ) 5 MG tablet Take 1 tablet (5 mg total) by mouth 2 (two) times daily. 60 tablet 2   cholecalciferol (VITAMIN D3) 25 MCG (1000 UNIT) tablet Take 1,000 Units by mouth daily.     clopidogrel  (PLAVIX ) 75 MG tablet Take 1 tablet (75 mg total) by mouth daily. 30 tablet 0   COD LIVER OIL PO Take 1 capsule by mouth daily.     ezetimibe  (ZETIA ) 10 MG tablet Take 1 tablet (10 mg total) by mouth daily. 30 tablet  0   Melatonin 10 MG CAPS Take 1 tablet by mouth daily.     midodrine  (PROAMATINE ) 2.5 MG tablet Take 2.5 mg by mouth 2 (two) times daily with a meal.     mirtazapine  (REMERON ) 7.5 MG tablet Take 1 tablet (7.5 mg total) by mouth at bedtime. 30 tablet 2   Multiple Vitamin (MULTI VITAMIN) TABS Take 1 tablet by mouth daily.     pantoprazole (PROTONIX) 40 MG  tablet Take 40 mg by mouth every morning.     rosuvastatin  (CRESTOR ) 20 MG tablet Take 1 tablet (20 mg total) by mouth daily. 30 tablet 0   Zinc 30 MG TABS Take 1 tablet by mouth daily.     No current facility-administered medications for this visit.    Medication Side Effects: None  Allergies: Allergies[1]  Past Medical History:  Diagnosis Date   Anxiety    Borderline hyperlipidemia    as of 10/2016: TC 134, TG 90, HDL 51, LDL 95 (PCP recently increase rosuvastatin  dose from 10 to 20 mg)   Borderline hypertension    Coronary artery disease involving native coronary artery of native heart without angina pectoris 03/01/2020   [facility] [place]  [facility]:; Dr. Clerance:  multivessel CAD => 50% pLAD & 60% mLAD, 70% pLCx & 90% mLCx,  70% mid RCA => CABG x 4   Coronary artery disease with history of myocardial infarction without history of CABG    Depression    Family history of premature coronary artery disease 08/26/2017   Hx of CABG x 4 03/19/2020   [facility], [place], [place]  -> CABG x4 (LIMA-LAD, SVG-OM1, SVG-OM2, SVG-dRCA   Hyperlipidemia due to dietary fat intake 08/26/2017    Past Medical History, Surgical history, Social history, and Family history were reviewed and updated as appropriate.   Please see review of systems for further details on the patient's review from today.   Objective:   Physical Exam:  There were no vitals taken for this visit.  Physical Exam Constitutional:      General: She is not in acute distress. Musculoskeletal:        General: No deformity.  Neurological:     Mental Status: She is alert and oriented to person, place, and time.     Coordination: Coordination normal.  Psychiatric:        Attention and Perception: Attention and perception normal. She does not perceive auditory or visual hallucinations.        Mood and Affect: Mood normal. Mood is not anxious or depressed. Affect is not labile,  blunt, angry or inappropriate.        Speech: Speech normal.        Behavior: Behavior normal.        Thought Content: Thought content normal. Thought content is not paranoid or delusional. Thought content does not include homicidal or suicidal ideation. Thought content does not include homicidal or suicidal plan.        Cognition and Memory: Cognition and memory normal.        Judgment: Judgment normal.     Comments: Insight intact     Lab Review:     Component Value Date/Time   NA 142 01/06/2023 1029   K 5.0 01/06/2023 1029   CL 105 01/06/2023 1029   CO2 24 01/06/2023 1029   GLUCOSE 99 01/06/2023 1029   GLUCOSE 99 09/20/2022 1545   BUN 14 01/06/2023 1029   CREATININE 0.72 01/06/2023 1029   CALCIUM   9.6 01/06/2023 1029   PROT 7.7 09/20/2022 1545   PROT 6.0 03/03/2022 0833   ALBUMIN 4.6 09/20/2022 1545   ALBUMIN 4.1 03/03/2022 0833   AST 30 09/20/2022 1545   ALT 21 09/20/2022 1545   ALKPHOS 69 09/20/2022 1545   BILITOT 0.7 09/20/2022 1545   BILITOT 0.5 03/03/2022 0833   GFRNONAA >60 09/20/2022 1545   GFRAA 95 10/16/2017 1412       Component Value Date/Time   WBC 6.2 01/06/2023 1029   WBC 11.2 (H) 09/20/2022 1545   RBC 4.49 01/06/2023 1029   RBC 4.41 09/20/2022 1545   HGB 13.9 01/06/2023 1029   HCT 43.7 01/06/2023 1029   PLT 266 01/06/2023 1029   MCV 97 01/06/2023 1029   MCH 31.0 01/06/2023 1029   MCH 30.8 09/20/2022 1545   MCHC 31.8 01/06/2023 1029   MCHC 33.3 09/20/2022 1545   RDW 12.2 01/06/2023 1029   LYMPHSABS 0.9 09/18/2022 1902   MONOABS 0.7 09/18/2022 1902   EOSABS 0.0 09/18/2022 1902   BASOSABS 0.0 09/18/2022 1902    No results found for: POCLITH, LITHIUM   No results found for: PHENYTOIN, PHENOBARB, VALPROATE, CBMZ   .res Assessment: Plan:    Plan:  PDMP reviewed  Buspar  5mg  BID Remeron  7.5 MG tablet at bedtime - depression/anxiety Cogentin  0.5mg  BID - movement disorder.     Discussed medications and treatment plans with  patient. Patient totally disabled and unable to work at this time with her anxiety, panic attacks and cognitive deficits.    RTC 4/6 weeks  25 minutes spent dedicated to the care of this patient on the date of this encounter to include pre-visit review of records, ordering of medication, post visit documentation, and face-to-face time with the patient discussing MDD, GAD, panic attacks and TD. Discussed continuing current medication regimen.  Patient advised to contact office with any questions, adverse effects, or acute worsening in signs and symptoms.   Discussed potential benefits, risk, and side effects of benzodiazepines to include potential risk of tolerance and dependence, as well as possible drowsiness. Advised patient not to drive if experiencing drowsiness and to take lowest possible effective dose to minimize risk of dependence and tolerance.   There are no diagnoses linked to this encounter.   Please see After Visit Summary for patient specific instructions.  Future Appointments  Date Time Provider Department Center  01/27/2024  9:00 AM Sherma Vanmetre, Angeline Mattocks, NP CP-CP None  01/27/2024  3:20 PM [facility] HVT PRE OP CLEARANCE APP [facility] H&V  02/18/2024  3:00 PM Bernardo Bruckner, [facility] CP-CP None  02/29/2024  2:30 PM [place], Erminio POUR, PA-C [facility] None    No orders of the defined types were placed in this encounter.   -------------------------------    [1] No Known Allergies

## 2024-02-18 ENCOUNTER — Ambulatory Visit (INDEPENDENT_AMBULATORY_CARE_PROVIDER_SITE_OTHER): Admitting: Mental Health

## 2024-02-18 DIAGNOSIS — F411 Generalized anxiety disorder: Secondary | ICD-10-CM | POA: Diagnosis not present

## 2024-02-18 DIAGNOSIS — F331 Major depressive disorder, recurrent, moderate: Secondary | ICD-10-CM

## 2024-02-18 NOTE — Progress Notes (Signed)
 [facility]   Name: Heather Lucas Date: 02/18/2024 MRN: [id number] DOB: 11-07-1960 PCP: Heather Elsie [name], MD   Time spent:  50 minutes   Treatment:  Ind. therapy   Mental Status Exam:         Appearance:    Casual      Behavior:   Appropriate   Motor:   WNL   Speech/Language:    Clear and Coherent   Affect:   Full range    Mood:   Euthymic   Thought process:   Logical, linear, goal directed   Thought content:     WNL   Sensory/Perceptual disturbances:     none   Orientation:   x4   Attention:   Good   Concentration:   Good   Memory:   Intact   Fund of knowledge:    Consistent with age and development   Insight:     Good   Judgment:    Good   Impulse Control:   Good       Reported Symptoms:  anxiety daily (10/10), depression (3/10)   Risk Assessment: Danger to Self:  No Self-injurious Behavior: No Danger to Others: No Duty to Warn:no Physical Aggression / Violence:No  Access to Firearms a concern: No  Gang Involvement:No  Patient / guardian was educated about steps to take if suicide or homicide risk level increases between visits: yes While future psychiatric events cannot be accurately predicted, the patient does not currently require acute inpatient psychiatric care and does not currently meet [place]  involuntary commitment criteria.          Medications:       Current Outpatient Medications  Medication Sig Dispense Refill   aspirin 81 MG EC tablet Take 81 mg by mouth daily.       buPROPion  (WELLBUTRIN  XL) 300 MG 24 hr tablet Take 1 tablet (300 mg total) by mouth daily. 30 tablet 2   clonazePAM  (KLONOPIN ) 1 MG tablet TAKE ONE TABLET BY MOUTH TWICE DAILY 60 tablet 2   clopidogrel  (PLAVIX ) 75 MG tablet Take 75 mg by mouth daily.       ergocalciferol  (VITAMIN D2) 1.25 MG (50000 UT) capsule Take 50,000 Units by mouth daily.       ezetimibe  (ZETIA ) 10 MG tablet Take 1 tablet (10 mg total) by mouth daily. 90 tablet 3   midodrine   (PROAMATINE ) 5 MG tablet Take 5 mg  in the morning  and take 2.5 mg ( 1/2 tablet ) in the evening.       mirtazapine  (REMERON ) 15 MG tablet Take 1 tablet (15 mg total) by mouth at bedtime. 30 tablet 2   rosuvastatin  (CRESTOR ) 20 MG tablet Take 20 mg by mouth daily.   5   Sennosides 15 MG TABS Take 15 mg by mouth daily. Takes 8 tablets a night       valbenazine  (INGREZZA ) 40 MG capsule Take 1 capsule (40 mg total) by mouth daily. 30 capsule 5   valbenazine  (INGREZZA ) 40 MG capsule Take 1 capsule (40 mg total) by mouth daily. 30 capsule 0    No current facility-administered medications for this visit.        Subjective:  Patient presents on time for today's session.  Assessed progress where patient shared how she has her disability hearing later this month.  She plans to meet with her attorney tomorrow and ask questions.  She stated she is anxious, uncertain of the process.  We discussed potential  benefits of being ready to ask certain questions of her returning to give her further understanding and reassurance.  She stated she plans to organize a list of questions to be ready.  She shared the stress of the last 2 years leading up to this point.  She is hopeful that she will be approved but identified how she plans to try and be ready to adjust regardless of the outcome.  Ways to cope and manage her anxiety, utilizing thought blocking and other CBT strategies was discussed.     Interventions: CBT, supportive therapy, motivational interviewing   Diagnoses:  Panic disorder Generalized anxiety disorder Major depressive disorder, recurrent, moderate   Plan: Patient is to use CBT, mindfulness and coping skills to help manage decrease symptoms associated with their diagnosis.     Long-term goals:   Maintain symptom reduction: The patient will report sustained reduction in symptoms of anxiety using both CBT and mindfulness interventions for 3 consecutive months progressively.  Improve emotional  regulation: The patient will learn and apply CBT and mindfulness-based strategies to regulate emotions and report an improvement in emotional regulation for at least 3 consecutive months progressively.   Short-term goal:  The patient will learn and apply CBT and mindfulness-based coping skills for managing anxiety and practice using it between sessions.       2.   Increase awareness of automatic thoughts: The patient will learn to identify automatic thoughts and increase awareness of their impact on emotions and behaviors through both CBT and mindfulness-based interventions.       3.    Identify coping related to managing her panic attacks and decrease frequency of the attacks from daily to 0-1 times per week         Lonni Fischer, [facility]

## 2024-02-24 ENCOUNTER — Ambulatory Visit: Admitting: Adult Health

## 2024-02-25 ENCOUNTER — Telehealth: Payer: Self-pay | Admitting: Adult Health

## 2024-02-25 NOTE — Telephone Encounter (Signed)
 Noted, thank you.

## 2024-02-25 NOTE — Telephone Encounter (Signed)
 Heather Lucas brought in another disability form.She paid $45 and form given to traci

## 2024-02-29 ENCOUNTER — Ambulatory Visit: Admitting: Physician Assistant

## 2024-02-29 ENCOUNTER — Encounter: Payer: Self-pay | Admitting: Physician Assistant

## 2024-02-29 VITALS — BP 132/84 | HR 77

## 2024-02-29 DIAGNOSIS — W908XXA Exposure to other nonionizing radiation, initial encounter: Secondary | ICD-10-CM | POA: Diagnosis not present

## 2024-02-29 DIAGNOSIS — L814 Other melanin hyperpigmentation: Secondary | ICD-10-CM | POA: Diagnosis not present

## 2024-02-29 DIAGNOSIS — L578 Other skin changes due to chronic exposure to nonionizing radiation: Secondary | ICD-10-CM | POA: Diagnosis not present

## 2024-02-29 DIAGNOSIS — L821 Other seborrheic keratosis: Secondary | ICD-10-CM

## 2024-02-29 DIAGNOSIS — Z1283 Encounter for screening for malignant neoplasm of skin: Secondary | ICD-10-CM | POA: Diagnosis not present

## 2024-02-29 DIAGNOSIS — D229 Melanocytic nevi, unspecified: Secondary | ICD-10-CM

## 2024-02-29 DIAGNOSIS — D1801 Hemangioma of skin and subcutaneous tissue: Secondary | ICD-10-CM | POA: Diagnosis not present

## 2024-02-29 NOTE — Patient Instructions (Signed)

## 2024-02-29 NOTE — Progress Notes (Deleted)
" ° °  New Patient Visit   Subjective  Heather Lucas is a 64 y.o. female who presents for the following: ***  Patient states she has *** located at the {LOCATION ON BODY:21951} that she would like to have examined. Patient reports the areas have been there for {NUMBER 1-10:22536} {Time; units week/month/year w plurals:19499}. She reports the areas {Actions; are/are not:16769} bothersome.Patient rates irritation {NUMBER 1-10:22536} out of 10. She states that the areas {ACTIONS; HAVE/HAVE NOT:19434} spread. Patient reports she {HAS HAS WNU:81165} previously been treated for these areas. Patient *** Hx of bx. Patient *** family history of skin cancer(s).  The patient has spots, moles and lesions to be evaluated, some may be new or changing and the patient may have concern these could be cancer.   The following portions of the chart were reviewed this encounter and updated as appropriate: medications, allergies, medical history  Review of Systems:  No other skin or systemic complaints except as noted in HPI or Assessment and Plan.  Objective  Well appearing patient in no apparent distress; mood and affect are within normal limits.  ***A full examination was performed including scalp, head, eyes, ears, nose, lips, neck, chest, axillae, abdomen, back, buttocks, bilateral upper extremities, bilateral lower extremities, hands, feet, fingers, toes, fingernails, and toenails. All findings within normal limits unless otherwise noted below.  ***A focused examination was performed of the following areas: ***  Relevant exam findings are noted in the Assessment and Plan.    Assessment & Plan       No follow-ups on file.  I, Doyce Pan, CMA, am acting as scribe for SANDRIDGE,BRENDA K, PA-C.   Documentation: I have reviewed the above documentation for accuracy and completeness, and I agree with the above.  SANDRIDGE,BRENDA K, PA-C     "

## 2024-02-29 NOTE — Progress Notes (Signed)
" ° °  New Patient Visit   Subjective  Heather Lucas is a 64 y.o. female NEW PATIENT who presents for the following:  Total Body Skin Exam (TBSE)  Patient present today for new patient visit for TBSE.The patient reports she has spots, moles and lesions to be evaluated left, chest. Left side, left under breast, both ears and one on the buttocks. some may be new or changing and the patient may have concern these could be cancer. Patient has not previously been treated by a dermatologist.Patient reports she does not have hx of bx. Patient denies family history of skin cancers. Patient reports throughout her lifetime has had moderate sun exposure. Currently, patient reports if she has excessive sun exposure, she does not apply sunscreen and/or wears protective coverings.  The following portions of the chart were reviewed this encounter and updated as appropriate: medications, allergies, medical history  Review of Systems:  No other skin or systemic complaints except as noted in HPI or Assessment and Plan.  Objective  Well appearing patient in no apparent distress; mood and affect are within normal limits.  A full examination was performed including scalp, head, eyes, ears, nose, lips, neck, chest, axillae, abdomen, back, buttocks, bilateral upper extremities, bilateral lower extremities, hands, feet, fingers, toes, fingernails, and toenails. All findings within normal limits unless otherwise noted below.     Relevant exam findings are noted in the Assessment and Plan.    Assessment & Plan   LENTIGINES, SEBORRHEIC KERATOSES, HEMANGIOMAS - Benign normal skin lesions - Benign-appearing - Call for any changes  MELANOCYTIC NEVI - Tan-brown and/or pink-flesh-colored symmetric macules and papules - Benign appearing on exam today - Observation - Call clinic for new or changing moles - Recommend daily use of broad spectrum spf 30+ sunscreen to sun-exposed areas.   ACTINIC DAMAGE - Chronic  condition, secondary to cumulative UV/sun exposure - diffuse scaly erythematous macules with underlying dyspigmentation - Recommend daily broad spectrum sunscreen SPF 30+ to sun-exposed areas, reapply every 2 hours as needed.  - Staying in the shade or wearing long sleeves, sun glasses (UVA+UVB protection) and wide brim hats (4-inch brim around the entire circumference of the hat) are also recommended for sun protection.  - Call for new or changing lesions.  SKIN CANCER SCREENING PERFORMED TODAY ACTINIC SKIN DAMAGE   CHERRY ANGIOMA   LENTIGINES   MULTIPLE BENIGN NEVI   SEBORRHEIC KERATOSIS   SCREENING EXAM FOR SKIN CANCER    Return in about 2 years (around 02/28/2026) for TBSE.  I, Doyce Pan, CMA, am acting as scribe for Kvon Mcilhenny K, PA-C.   Documentation: I have reviewed the above documentation for accuracy and completeness, and I agree with the above.  Nollie Terlizzi K, PA-C    "

## 2024-03-02 ENCOUNTER — Other Ambulatory Visit: Payer: Self-pay | Admitting: Adult Health

## 2024-03-02 DIAGNOSIS — F331 Major depressive disorder, recurrent, moderate: Secondary | ICD-10-CM

## 2024-03-21 ENCOUNTER — Ambulatory Visit: Admitting: Mental Health

## 2024-03-29 ENCOUNTER — Ambulatory Visit: Admitting: Adult Health
